# Patient Record
Sex: Female | Born: 1960 | Race: White | Hispanic: No | Marital: Married | Smoking: Former smoker
Health system: Southern US, Community
[De-identification: ages and names within clinical notes are randomized; demographics above are authoritative.]

## PROBLEM LIST (undated history)

## (undated) DIAGNOSIS — F1911 Other psychoactive substance abuse, in remission: Secondary | ICD-10-CM

## (undated) DIAGNOSIS — N951 Menopausal and female climacteric states: Secondary | ICD-10-CM

## (undated) DIAGNOSIS — E785 Hyperlipidemia, unspecified: Secondary | ICD-10-CM

## (undated) DIAGNOSIS — M1990 Unspecified osteoarthritis, unspecified site: Secondary | ICD-10-CM

## (undated) DIAGNOSIS — M069 Rheumatoid arthritis, unspecified: Secondary | ICD-10-CM

## (undated) DIAGNOSIS — E559 Vitamin D deficiency, unspecified: Secondary | ICD-10-CM

## (undated) HISTORY — DX: Vitamin D deficiency, unspecified: E55.9

## (undated) HISTORY — PX: TUBAL LIGATION: SHX77

## (undated) HISTORY — DX: Rheumatoid arthritis, unspecified: M06.9

## (undated) HISTORY — DX: Menopausal and female climacteric states: N95.1

## (undated) HISTORY — DX: Other psychoactive substance abuse, in remission: F19.11

## (undated) HISTORY — PX: ABDOMINAL HYSTERECTOMY: SHX81

---

## 2001-03-07 ENCOUNTER — Other Ambulatory Visit: Admission: RE | Admit: 2001-03-07 | Discharge: 2001-03-07 | Payer: Self-pay | Admitting: Obstetrics and Gynecology

## 2002-06-17 ENCOUNTER — Encounter: Admission: RE | Admit: 2002-06-17 | Discharge: 2002-06-17 | Payer: Self-pay | Admitting: Family Medicine

## 2002-06-17 ENCOUNTER — Encounter: Payer: Self-pay | Admitting: Family Medicine

## 2002-07-10 ENCOUNTER — Encounter (INDEPENDENT_AMBULATORY_CARE_PROVIDER_SITE_OTHER): Payer: Self-pay | Admitting: *Deleted

## 2002-07-10 ENCOUNTER — Observation Stay (HOSPITAL_COMMUNITY): Admission: RE | Admit: 2002-07-10 | Discharge: 2002-07-11 | Payer: Self-pay | Admitting: Surgery

## 2002-09-18 ENCOUNTER — Other Ambulatory Visit: Admission: RE | Admit: 2002-09-18 | Discharge: 2002-09-18 | Payer: Self-pay | Admitting: Obstetrics and Gynecology

## 2004-07-13 ENCOUNTER — Other Ambulatory Visit: Admission: RE | Admit: 2004-07-13 | Discharge: 2004-07-13 | Payer: Self-pay | Admitting: Obstetrics and Gynecology

## 2005-03-20 ENCOUNTER — Ambulatory Visit (HOSPITAL_COMMUNITY): Admission: RE | Admit: 2005-03-20 | Discharge: 2005-03-20 | Payer: Self-pay | Admitting: Obstetrics and Gynecology

## 2006-08-23 ENCOUNTER — Ambulatory Visit (HOSPITAL_COMMUNITY): Admission: RE | Admit: 2006-08-23 | Discharge: 2006-08-23 | Payer: Self-pay | Admitting: Obstetrics and Gynecology

## 2007-08-28 ENCOUNTER — Ambulatory Visit (HOSPITAL_COMMUNITY): Admission: RE | Admit: 2007-08-28 | Discharge: 2007-08-28 | Payer: Self-pay | Admitting: Obstetrics and Gynecology

## 2007-09-08 ENCOUNTER — Encounter: Admission: RE | Admit: 2007-09-08 | Discharge: 2007-09-08 | Payer: Self-pay | Admitting: Obstetrics and Gynecology

## 2008-10-12 ENCOUNTER — Ambulatory Visit (HOSPITAL_COMMUNITY): Admission: RE | Admit: 2008-10-12 | Discharge: 2008-10-12 | Payer: Self-pay | Admitting: Obstetrics and Gynecology

## 2009-10-13 ENCOUNTER — Ambulatory Visit (HOSPITAL_COMMUNITY): Admission: RE | Admit: 2009-10-13 | Discharge: 2009-10-13 | Payer: Self-pay | Admitting: Obstetrics and Gynecology

## 2010-02-20 ENCOUNTER — Encounter: Payer: Self-pay | Admitting: Obstetrics and Gynecology

## 2010-06-16 NOTE — Op Note (Signed)
NAMEMARLEA, GAMBILL                           ACCOUNT NO.:  [id number]   MEDICAL RECORD NO.:  [id number]                   PATIENT TYPE:  AMB   LOCATION:  DAY                                  FACILITY:  [facility]   PHYSICIAN:  Abigail Miyamoto, M.D.              DATE OF BIRTH:  Jul 27, 1960   DATE OF PROCEDURE:  07/10/2002  DATE OF DISCHARGE:                                 OPERATIVE REPORT   PREOPERATIVE DIAGNOSIS:  Symptomatic cholelithiasis.   POSTOPERATIVE DIAGNOSIS:  Symptomatic cholelithiasis.   SURGICAL PROCEDURE:  Laparoscopic cholecystectomy.   SURGEON:  Douglas A. Magnus Ivan, M.D.   ASSISTANT:  Sheppard Plumber. Earlene Plater, M.D.   ANESTHESIA:  General endotracheal.   ESTIMATED BLOOD LOSS:  Minimal.   PROCEDURE IN DETAIL:  The patient was brought to the operating room and  identified as Donna Jacobs.  She was placed supine on the operating room  table, and general anesthesia was induced.  Her abdomen was then prepped and  draped in the usual sterile fashion.  Using a #15 blade, a small transverse  incision was made below the umbilicus.  The incision was carried down  through the fascia which was then opened with a scalpel.  Hemostat was then  used to pass through the peritoneal cavity.  Next, a 0 Vicryl pursestring  suture was placed around the fascial opening.  The Hasson port was placed  through the opening, and insufflation of the abdomen was begun.  Next, an 12  mm port was placed in the patient's epigastrium, and two 5 mm ports were  placed in the patient's right flank under direct vision.  The gallbladder  was then retracted above the liver bed.  The cystic artery was found to be  anterior and was first clipped twice proximally and once distally and  transected with the scissors.  The cystic duct was then easily dissected  out, clipped three times proximally, once distally, and transected with  scissors as well.  A small posterior branch of the cystic artery was then  identified  and clipped once as well.  The gallbladder was then slowly  dissected free from the liver bed with the electrocautery.  Once the  gallbladder was freed from the liver bed, it was placed in an Endosac.  Liver bed was again examined, and hemostasis was achieved with cautery.  The  gallbladder was then removed through the incision at the umbilicus.  The 0  Vicryl at the umbilicus was tied, closing the fascial defect.  Examination  with camera here did reveal a small amount of bleeding here.  Electrocautery  was used in an attempt to stop this but since there was still a small ooze,  another 0 Vicryl in a figure-of-eight fashion was placed around the fascial  opening and helped achieve hemostasis.  The abdomen was then copiously  irrigated with normal saline.  Hemostasis appeared to be achieved.  All  ports were then removed under direct vision, and the abdomen was deflated.  All incisions were then anesthetized with 0.25% Marcaine and then closed  with 4-0 Monocryl subcuticular sutures.  Steri-Strips, gauze, and tape were  then applied.  The patient tolerated the procedure well.  All sponge,  needle, and instrument counts were correct at the end of the procedure.  The  patient was then extubated in the operating room and taken in stable  condition to the recovery room.                                              Abigail Miyamoto, M.D.   DB/MEDQ  D:  07/10/2002  T:  07/10/2002  Job:  [id number]

## 2010-09-14 ENCOUNTER — Other Ambulatory Visit: Payer: Self-pay

## 2010-09-14 DIAGNOSIS — M79671 Pain in right foot: Secondary | ICD-10-CM

## 2010-09-18 ENCOUNTER — Ambulatory Visit
Admission: RE | Admit: 2010-09-18 | Discharge: 2010-09-18 | Disposition: A | Discharge status: Home / Self Care | Payer: 59 | Source: Ambulatory Visit

## 2010-09-18 DIAGNOSIS — M79671 Pain in right foot: Secondary | ICD-10-CM

## 2010-09-18 MED ORDER — GADOBENATE DIMEGLUMINE 529 MG/ML IV SOLN
8.0000 mL | Freq: Once | INTRAVENOUS | Status: AC | PRN
  Administered 2010-09-18: 8 mL via INTRAVENOUS

## 2010-10-10 ENCOUNTER — Other Ambulatory Visit (HOSPITAL_COMMUNITY): Payer: Self-pay | Admitting: Obstetrics and Gynecology

## 2010-10-10 DIAGNOSIS — Z1231 Encounter for screening mammogram for malignant neoplasm of breast: Secondary | ICD-10-CM

## 2010-10-23 ENCOUNTER — Ambulatory Visit (HOSPITAL_COMMUNITY)
Admission: RE | Admit: 2010-10-23 | Discharge: 2010-10-23 | Disposition: A | Discharge status: Home / Self Care | Payer: 59 | Source: Ambulatory Visit | Attending: Obstetrics and Gynecology | Admitting: Obstetrics and Gynecology

## 2010-10-23 DIAGNOSIS — Z1231 Encounter for screening mammogram for malignant neoplasm of breast: Secondary | ICD-10-CM

## 2011-10-15 ENCOUNTER — Other Ambulatory Visit (HOSPITAL_COMMUNITY): Payer: Self-pay | Admitting: Nurse Practitioner

## 2011-10-15 DIAGNOSIS — Z1231 Encounter for screening mammogram for malignant neoplasm of breast: Secondary | ICD-10-CM

## 2011-11-02 ENCOUNTER — Ambulatory Visit (HOSPITAL_COMMUNITY)
Admission: RE | Admit: 2011-11-02 | Discharge: 2011-11-02 | Disposition: A | Discharge status: Home / Self Care | Payer: 59 | Source: Ambulatory Visit | Attending: Nurse Practitioner | Admitting: Nurse Practitioner

## 2011-11-02 DIAGNOSIS — Z1231 Encounter for screening mammogram for malignant neoplasm of breast: Secondary | ICD-10-CM

## 2012-10-22 ENCOUNTER — Other Ambulatory Visit (HOSPITAL_COMMUNITY): Payer: Self-pay | Admitting: Nurse Practitioner

## 2012-10-22 DIAGNOSIS — Z1231 Encounter for screening mammogram for malignant neoplasm of breast: Secondary | ICD-10-CM

## 2012-11-03 ENCOUNTER — Ambulatory Visit (HOSPITAL_COMMUNITY)
Admission: RE | Admit: 2012-11-03 | Discharge: 2012-11-03 | Disposition: A | Discharge status: Home / Self Care | Payer: 59 | Source: Ambulatory Visit | Attending: Nurse Practitioner | Admitting: Nurse Practitioner

## 2012-11-03 DIAGNOSIS — Z1231 Encounter for screening mammogram for malignant neoplasm of breast: Secondary | ICD-10-CM | POA: Insufficient documentation

## 2013-08-25 ENCOUNTER — Ambulatory Visit
Admission: RE | Admit: 2013-08-25 | Discharge: 2013-08-25 | Disposition: A | Discharge status: Home / Self Care | Payer: 59 | Source: Ambulatory Visit | Attending: Family Medicine | Admitting: Family Medicine

## 2013-08-25 ENCOUNTER — Other Ambulatory Visit: Payer: Self-pay | Admitting: Family Medicine

## 2013-08-25 DIAGNOSIS — M25572 Pain in left ankle and joints of left foot: Principal | ICD-10-CM

## 2013-08-25 DIAGNOSIS — M25571 Pain in right ankle and joints of right foot: Secondary | ICD-10-CM

## 2013-10-19 ENCOUNTER — Other Ambulatory Visit (HOSPITAL_COMMUNITY): Payer: Self-pay | Admitting: Nurse Practitioner

## 2013-10-19 DIAGNOSIS — Z1231 Encounter for screening mammogram for malignant neoplasm of breast: Secondary | ICD-10-CM

## 2013-11-10 ENCOUNTER — Ambulatory Visit (HOSPITAL_COMMUNITY): Payer: 59

## 2013-11-16 ENCOUNTER — Ambulatory Visit (HOSPITAL_COMMUNITY)
Admission: RE | Admit: 2013-11-16 | Discharge: 2013-11-16 | Disposition: A | Discharge status: Home / Self Care | Payer: 59 | Source: Ambulatory Visit | Attending: Nurse Practitioner | Admitting: Nurse Practitioner

## 2013-11-16 DIAGNOSIS — Z1231 Encounter for screening mammogram for malignant neoplasm of breast: Secondary | ICD-10-CM | POA: Diagnosis not present

## 2014-08-16 ENCOUNTER — Other Ambulatory Visit: Payer: Self-pay | Admitting: Gastroenterology

## 2014-11-11 ENCOUNTER — Other Ambulatory Visit (HOSPITAL_BASED_OUTPATIENT_CLINIC_OR_DEPARTMENT_OTHER): Payer: Self-pay | Admitting: Obstetrics and Gynecology

## 2014-11-11 DIAGNOSIS — Z1231 Encounter for screening mammogram for malignant neoplasm of breast: Secondary | ICD-10-CM

## 2014-11-18 ENCOUNTER — Ambulatory Visit (HOSPITAL_BASED_OUTPATIENT_CLINIC_OR_DEPARTMENT_OTHER): Payer: Self-pay

## 2015-09-14 ENCOUNTER — Ambulatory Visit
Admission: RE | Admit: 2015-09-14 | Discharge: 2015-09-14 | Disposition: A | Discharge status: Home / Self Care | Payer: 59 | Source: Ambulatory Visit | Attending: Rheumatology | Admitting: Rheumatology

## 2015-09-14 ENCOUNTER — Other Ambulatory Visit: Payer: Self-pay | Admitting: Rheumatology

## 2015-09-14 DIAGNOSIS — M545 Low back pain: Secondary | ICD-10-CM

## 2016-01-06 ENCOUNTER — Other Ambulatory Visit: Payer: Self-pay | Admitting: Rheumatology

## 2016-01-06 DIAGNOSIS — M545 Low back pain: Secondary | ICD-10-CM

## 2016-01-21 ENCOUNTER — Ambulatory Visit
Admission: RE | Admit: 2016-01-21 | Discharge: 2016-01-21 | Disposition: A | Discharge status: Home / Self Care | Payer: 59 | Source: Ambulatory Visit | Attending: Rheumatology | Admitting: Rheumatology

## 2016-01-21 DIAGNOSIS — M545 Low back pain: Secondary | ICD-10-CM

## 2016-04-05 ENCOUNTER — Other Ambulatory Visit: Payer: Self-pay | Admitting: Neurological Surgery

## 2016-04-05 DIAGNOSIS — M4316 Spondylolisthesis, lumbar region: Secondary | ICD-10-CM

## 2016-06-15 ENCOUNTER — Ambulatory Visit
Admission: RE | Admit: 2016-06-15 | Discharge: 2016-06-15 | Disposition: A | Discharge status: Home / Self Care | Payer: 59 | Source: Ambulatory Visit | Attending: Neurological Surgery | Admitting: Neurological Surgery

## 2016-06-15 DIAGNOSIS — M4316 Spondylolisthesis, lumbar region: Secondary | ICD-10-CM

## 2016-07-19 ENCOUNTER — Other Ambulatory Visit: Payer: Self-pay | Admitting: Neurological Surgery

## 2016-08-08 NOTE — Pre-Procedure Instructions (Signed)
Donna Jacobs  08/08/2016      [id number] - [place], [place] - [address] [place] [address] Phone: [phone] Fax: [phone]    Your procedure is scheduled on July 18  Report to [facility] Admitting at [phone] A.M.  Call this number if you have problems the morning of surgery:  [phone]   Remember:  Do not eat food or drink liquids after midnight.   Take these medicines the morning of surgery with A SIP OF WATER   7 days prior to surgery STOP taking any Aspirin, Aleve, Naproxen, Ibuprofen, Motrin, Advil, Goody's, BC's, all herbal medications, fish oil, and all vitamins    Do not wear jewelry, make-up or nail polish.  Do not wear lotions, powders, or perfumes, or deoderant.  Do not shave 48 hours prior to surgery.  Men may shave face and neck.  Do not bring valuables to the hospital.  [facility] is not responsible for any belongings or valuables.  Contacts, dentures or bridgework may not be worn into surgery.  Leave your suitcase in the car.  After surgery it may be brought to your room.  For patients admitted to the hospital, discharge time will be determined by your treatment team.  Patients discharged the day of surgery will not be allowed to drive home.    Special instructions:   [place]- Preparing For Surgery  Before surgery, you can play an important role. Because skin is not sterile, your skin needs to be as free of germs as possible. You can reduce the number of germs on your skin by washing with CHG (chlorahexidine gluconate) Soap before surgery.  CHG is an antiseptic cleaner which kills germs and bonds with the skin to continue killing germs even after washing.  Please do not use if you have an allergy to CHG or antibacterial soaps. If your skin becomes reddened/irritated stop using the CHG.  Do not shave (including legs and underarms) for at least 48 hours prior to  first CHG shower. It is OK to shave your face.  Please follow these instructions carefully.   1. Shower the NIGHT BEFORE SURGERY and the MORNING OF SURGERY with CHG.   2. If you chose to wash your hair, wash your hair first as usual with your normal shampoo.  3. After you shampoo, rinse your hair and body thoroughly to remove the shampoo.  4. Use CHG as you would any other liquid soap. You can apply CHG directly to the skin and wash gently with a scrungie or a clean washcloth.   5. Apply the CHG Soap to your body ONLY FROM THE NECK DOWN.  Do not use on open wounds or open sores. Avoid contact with your eyes, ears, mouth and genitals (private parts). Wash genitals (private parts) with your normal soap.  6. Wash thoroughly, paying special attention to the area where your surgery will be performed.  7. Thoroughly rinse your body with warm water from the neck down.  8. DO NOT shower/wash with your normal soap after using and rinsing off the CHG Soap.  9. Pat yourself dry with a CLEAN TOWEL.   10. Wear CLEAN PAJAMAS   11. Place CLEAN SHEETS on your bed the night of your first shower and DO NOT SLEEP WITH PETS.    Day of Surgery: Do not apply any deodorants/lotions. Please wear clean clothes to the hospital/surgery center.  Please read over the following fact sheets that you were given.

## 2016-08-09 ENCOUNTER — Ambulatory Visit (HOSPITAL_COMMUNITY)
Admission: RE | Admit: 2016-08-09 | Discharge: 2016-08-09 | Disposition: A | Discharge status: Home / Self Care | Payer: 59 | Source: Ambulatory Visit | Attending: Neurological Surgery | Admitting: Neurological Surgery

## 2016-08-09 ENCOUNTER — Encounter (HOSPITAL_COMMUNITY)
Admission: RE | Admit: 2016-08-09 | Discharge: 2016-08-09 | Disposition: A | Discharge status: Home / Self Care | Payer: 59 | Source: Ambulatory Visit | Attending: Neurological Surgery | Admitting: Neurological Surgery

## 2016-08-09 ENCOUNTER — Encounter (HOSPITAL_COMMUNITY): Payer: Self-pay

## 2016-08-09 DIAGNOSIS — M4316 Spondylolisthesis, lumbar region: Secondary | ICD-10-CM | POA: Insufficient documentation

## 2016-08-09 DIAGNOSIS — M4310 Spondylolisthesis, site unspecified: Secondary | ICD-10-CM

## 2016-08-09 DIAGNOSIS — Z01818 Encounter for other preprocedural examination: Secondary | ICD-10-CM | POA: Diagnosis not present

## 2016-08-09 HISTORY — DX: Hyperlipidemia, unspecified: E78.5

## 2016-08-09 HISTORY — DX: Unspecified osteoarthritis, unspecified site: M19.90

## 2016-08-09 LAB — BASIC METABOLIC PANEL
ANION GAP: 9 (ref 5–15)
BUN: 10 mg/dL (ref 6–20)
CALCIUM: 9 mg/dL (ref 8.9–10.3)
CO2: 25 mmol/L (ref 22–32)
CREATININE: 0.71 mg/dL (ref 0.44–1.00)
Chloride: 106 mmol/L (ref 101–111)
Glucose, Bld: 93 mg/dL (ref 65–99)
Potassium: 3.7 mmol/L (ref 3.5–5.1)
SODIUM: 140 mmol/L (ref 135–145)

## 2016-08-09 LAB — TYPE AND SCREEN
ABO/RH(D): A POS
Antibody Screen: NEGATIVE

## 2016-08-09 LAB — CBC WITH DIFFERENTIAL/PLATELET
BASOS ABS: 0.1 10*3/uL (ref 0.0–0.1)
BASOS PCT: 1 %
EOS ABS: 0.2 10*3/uL (ref 0.0–0.7)
Eosinophils Relative: 2 %
HEMATOCRIT: 41.4 % (ref 36.0–46.0)
HEMOGLOBIN: 13.9 g/dL (ref 12.0–15.0)
Lymphocytes Relative: 24 %
Lymphs Abs: 1.7 10*3/uL (ref 0.7–4.0)
MCH: 29.6 pg (ref 26.0–34.0)
MCHC: 33.6 g/dL (ref 30.0–36.0)
MCV: 88.1 fL (ref 78.0–100.0)
Monocytes Absolute: 0.5 10*3/uL (ref 0.1–1.0)
Monocytes Relative: 6 %
NEUTROS ABS: 4.7 10*3/uL (ref 1.7–7.7)
NEUTROS PCT: 67 %
Platelets: 229 10*3/uL (ref 150–400)
RBC: 4.7 MIL/uL (ref 3.87–5.11)
RDW: 13 % (ref 11.5–15.5)
WBC: 7.1 10*3/uL (ref 4.0–10.5)

## 2016-08-09 LAB — PROTIME-INR
INR: 1.06
PROTHROMBIN TIME: 13.8 s (ref 11.4–15.2)

## 2016-08-09 LAB — ABO/RH: ABO/RH(D): A POS

## 2016-08-09 LAB — SURGICAL PCR SCREEN
MRSA, PCR: NEGATIVE
Staphylococcus aureus: POSITIVE — AB

## 2016-08-09 NOTE — Pre-Procedure Instructions (Signed)
Donna Jacobs  08/09/2016      [id number] - [place], [place] - [address] [place] [address] Phone: [phone] Fax: [phone]    Your procedure is scheduled on July 18  Report to [facility] Admitting at [phone] A.M.  Call this number if you have problems the morning of surgery:  [phone]   Remember:  Do not eat food or drink liquids after midnight. On 08/14/2016- Tuesday night    Take these medicines the morning of surgery with A SIP OF WATER : Tylenol is OK the morning of surgery if you need it  7 days prior to surgery STOP taking any Aspirin, Aleve, Naproxen, Ibuprofen, Motrin, Advil, Goody's, BC's, all herbal medications, fish oil, and all vitamins    Do not wear jewelry, make-up or nail polish.  Do not wear lotions, powders, or perfumes, or deoderant.  Do not shave 48 hours prior to surgery.  Men may shave face and neck.  Do not bring valuables to the hospital.  [facility] is not responsible for any belongings or valuables.  Contacts, dentures or bridgework may not be worn into surgery.  Leave your suitcase in the car.  After surgery it may be brought to your room.  For patients admitted to the hospital, discharge time will be determined by your treatment team.  Patients discharged the day of surgery will not be allowed to drive home.    Special instructions:   [place]- Preparing For Surgery  Before surgery, you can play an important role. Because skin is not sterile, your skin needs to be as free of germs as possible. You can reduce the number of germs on your skin by washing with CHG (chlorahexidine gluconate) Soap before surgery.  CHG is an antiseptic cleaner which kills germs and bonds with the skin to continue killing germs even after washing.  Please do not use if you have an allergy to CHG or antibacterial soaps. If your skin becomes reddened/irritated stop using the  CHG.  Do not shave (including legs and underarms) for at least 48 hours prior to first CHG shower. It is OK to shave your face.  Please follow these instructions carefully.   1. Shower the NIGHT BEFORE SURGERY and the MORNING OF SURGERY with CHG.   2. If you chose to wash your hair, wash your hair first as usual with your normal shampoo.  3. After you shampoo, rinse your hair and body thoroughly to remove the shampoo.  4. Use CHG as you would any other liquid soap. You can apply CHG directly to the skin and wash gently with a scrungie or a clean washcloth.   5. Apply the CHG Soap to your body ONLY FROM THE NECK DOWN.  Do not use on open wounds or open sores. Avoid contact with your eyes, ears, mouth and genitals (private parts). Wash genitals (private parts) with your normal soap.  6. Wash thoroughly, paying special attention to the area where your surgery will be performed.  7. Thoroughly rinse your body with warm water from the neck down.  8. DO NOT shower/wash with your normal soap after using and rinsing off the CHG Soap.  9. Pat yourself dry with a CLEAN TOWEL.   10. Wear CLEAN PAJAMAS   11. Place CLEAN SHEETS on your bed the night of your first shower and DO NOT SLEEP WITH PETS.  Day of Surgery: Do not apply any deodorants/lotions. Please wear clean clothes to the hospital/surgery center.      Please read over the following fact sheets that you were given. Coughing and Deep Breathing, MRSA Information and Surgical Site Infection Prevention

## 2016-08-09 NOTE — Progress Notes (Signed)
Pt. Remarks that she is off all meds. that she was on previously for ? Autoimmune disorder.  Pt. Unaware of an auto immune condition- states that she knows she has " arthritis".  Pt. Speaks of seeing Dr. Kellie Simmering in the past.  Pt. Reports that she goes "to whomever" if she gets sick but she states about a year ago she went to [place] [facility]. At [employer]. Pt. Reports that she has had in the past "heavy breathing". Pt. States she hasn't ever tolda nyone about it.  Pt. Denies any chest concerns Today. Pt. Is a weak historian, doesn't know her weight & seemed confused as to why she was ever on methotrexate, plaquenil, etc. Pt. 's record from [facility]. Requested by fax.

## 2016-08-13 NOTE — Progress Notes (Signed)
Call to [facility] gr[facility]- at [facility] location, pt. Last seen there in 2016- by Dr. Zachery Dauer &/ or Dr. Clarene Duke.  Spoke with Sunny Schlein & she will fax that record to [facility]- [phone].

## 2016-08-15 ENCOUNTER — Encounter (HOSPITAL_COMMUNITY): Payer: Self-pay

## 2016-08-15 ENCOUNTER — Inpatient Hospital Stay (HOSPITAL_COMMUNITY): Payer: 59 | Admitting: Certified Registered"

## 2016-08-15 ENCOUNTER — Inpatient Hospital Stay (HOSPITAL_COMMUNITY)
Admission: RE | Admit: 2016-08-15 | Discharge: 2016-08-16 | DRG: 460 | Disposition: A | Discharge status: Home / Self Care | Payer: 59 | Source: Ambulatory Visit | Attending: Neurological Surgery | Admitting: Neurological Surgery

## 2016-08-15 ENCOUNTER — Encounter (HOSPITAL_COMMUNITY)
Admission: RE | Disposition: A | Discharge status: Home / Self Care | Payer: Self-pay | Source: Ambulatory Visit | Attending: Neurological Surgery

## 2016-08-15 ENCOUNTER — Inpatient Hospital Stay (HOSPITAL_COMMUNITY): Payer: 59

## 2016-08-15 ENCOUNTER — Inpatient Hospital Stay (HOSPITAL_COMMUNITY): Payer: 59 | Admitting: Emergency Medicine

## 2016-08-15 DIAGNOSIS — Z981 Arthrodesis status: Secondary | ICD-10-CM

## 2016-08-15 DIAGNOSIS — Z87891 Personal history of nicotine dependence: Secondary | ICD-10-CM

## 2016-08-15 DIAGNOSIS — Z885 Allergy status to narcotic agent status: Secondary | ICD-10-CM | POA: Diagnosis not present

## 2016-08-15 DIAGNOSIS — M419 Scoliosis, unspecified: Secondary | ICD-10-CM | POA: Diagnosis present

## 2016-08-15 DIAGNOSIS — Z419 Encounter for procedure for purposes other than remedying health state, unspecified: Secondary | ICD-10-CM

## 2016-08-15 DIAGNOSIS — M48061 Spinal stenosis, lumbar region without neurogenic claudication: Secondary | ICD-10-CM | POA: Diagnosis present

## 2016-08-15 DIAGNOSIS — M4316 Spondylolisthesis, lumbar region: Secondary | ICD-10-CM | POA: Diagnosis present

## 2016-08-15 HISTORY — DX: Arthrodesis status: Z98.1

## 2016-08-15 SURGERY — POSTERIOR LUMBAR FUSION 1 LEVEL
Anesthesia: General | Site: Spine Lumbar

## 2016-08-15 MED ORDER — METHOCARBAMOL 1000 MG/10ML IJ SOLN
500.0000 mg | Freq: Four times a day (QID) | INTRAVENOUS | Status: DC | PRN
  Filled 2016-08-15: qty 5

## 2016-08-15 MED ORDER — VANCOMYCIN HCL 1000 MG IV SOLR
INTRAVENOUS | Status: AC
  Filled 2016-08-15: qty 1000

## 2016-08-15 MED ORDER — PROPOFOL 10 MG/ML IV BOLUS
INTRAVENOUS | Status: AC
  Filled 2016-08-15: qty 20

## 2016-08-15 MED ORDER — DEXAMETHASONE SODIUM PHOSPHATE 10 MG/ML IJ SOLN
INTRAMUSCULAR | Status: AC
  Filled 2016-08-15: qty 1

## 2016-08-15 MED ORDER — ONDANSETRON HCL 4 MG/2ML IJ SOLN
INTRAMUSCULAR | Status: AC
  Filled 2016-08-15: qty 2

## 2016-08-15 MED ORDER — ONDANSETRON HCL 4 MG PO TABS: 4.0000 mg | ORAL_TABLET | Freq: Four times a day (QID) | ORAL | Status: DC | PRN

## 2016-08-15 MED ORDER — OXYCODONE HCL 5 MG PO TABS
5.0000 mg | ORAL_TABLET | ORAL | Status: DC | PRN
  Administered 2016-08-15 – 2016-08-16 (×4): 10 mg via ORAL
  Filled 2016-08-15 (×3): qty 2

## 2016-08-15 MED ORDER — CHLORHEXIDINE GLUCONATE CLOTH 2 % EX PADS: 6.0000 | MEDICATED_PAD | Freq: Once | CUTANEOUS | Status: DC

## 2016-08-15 MED ORDER — ARTIFICIAL TEARS OPHTHALMIC OINT
TOPICAL_OINTMENT | OPHTHALMIC | Status: AC
  Filled 2016-08-15: qty 3.5

## 2016-08-15 MED ORDER — ONDANSETRON HCL 4 MG/2ML IJ SOLN
INTRAMUSCULAR | Status: DC | PRN
  Administered 2016-08-15: 4 mg via INTRAVENOUS

## 2016-08-15 MED ORDER — DEXAMETHASONE SODIUM PHOSPHATE 10 MG/ML IJ SOLN
10.0000 mg | INTRAMUSCULAR | Status: DC
  Filled 2016-08-15: qty 1

## 2016-08-15 MED ORDER — EPHEDRINE 5 MG/ML INJ
INTRAVENOUS | Status: AC
  Filled 2016-08-15: qty 10

## 2016-08-15 MED ORDER — SODIUM CHLORIDE 0.9% FLUSH: 3.0000 mL | INTRAVENOUS | Status: DC | PRN

## 2016-08-15 MED ORDER — BUPIVACAINE HCL (PF) 0.25 % IJ SOLN
INTRAMUSCULAR | Status: DC | PRN
Start: 2016-08-15 — End: 2016-08-15
  Administered 2016-08-15: 5 mL

## 2016-08-15 MED ORDER — THROMBIN 5000 UNITS EX SOLR
CUTANEOUS | Status: AC
  Filled 2016-08-15: qty 5000

## 2016-08-15 MED ORDER — FENTANYL CITRATE (PF) 250 MCG/5ML IJ SOLN
INTRAMUSCULAR | Status: AC
  Filled 2016-08-15: qty 5

## 2016-08-15 MED ORDER — ACETAMINOPHEN 650 MG RE SUPP: 650.0000 mg | RECTAL | Status: DC | PRN

## 2016-08-15 MED ORDER — METHOCARBAMOL 500 MG PO TABS
500.0000 mg | ORAL_TABLET | Freq: Four times a day (QID) | ORAL | Status: DC | PRN
  Administered 2016-08-15 – 2016-08-16 (×2): 500 mg via ORAL
  Filled 2016-08-15: qty 1

## 2016-08-15 MED ORDER — VECURONIUM BROMIDE 10 MG IV SOLR
INTRAVENOUS | Status: DC | PRN
  Administered 2016-08-15 (×3): 1 mg via INTRAVENOUS
  Administered 2016-08-15: 2 mg via INTRAVENOUS
  Administered 2016-08-15: 1 mg via INTRAVENOUS

## 2016-08-15 MED ORDER — SENNA 8.6 MG PO TABS
1.0000 | ORAL_TABLET | Freq: Two times a day (BID) | ORAL | Status: DC
  Administered 2016-08-15: 8.6 mg via ORAL
  Filled 2016-08-15: qty 1

## 2016-08-15 MED ORDER — VECURONIUM BROMIDE 10 MG IV SOLR
INTRAVENOUS | Status: AC
  Filled 2016-08-15: qty 10

## 2016-08-15 MED ORDER — CELECOXIB 200 MG PO CAPS
200.0000 mg | ORAL_CAPSULE | Freq: Two times a day (BID) | ORAL | Status: DC
  Administered 2016-08-15: 200 mg via ORAL
  Filled 2016-08-15: qty 1

## 2016-08-15 MED ORDER — FENTANYL CITRATE (PF) 100 MCG/2ML IJ SOLN
INTRAMUSCULAR | Status: AC
  Filled 2016-08-15: qty 2

## 2016-08-15 MED ORDER — METHOCARBAMOL 500 MG PO TABS
ORAL_TABLET | ORAL | Status: AC
  Administered 2016-08-15: 500 mg via ORAL
  Filled 2016-08-15: qty 1

## 2016-08-15 MED ORDER — CEFAZOLIN SODIUM-DEXTROSE 2-4 GM/100ML-% IV SOLN
2.0000 g | INTRAVENOUS | Status: AC
  Administered 2016-08-15: 2 g via INTRAVENOUS
  Filled 2016-08-15: qty 100

## 2016-08-15 MED ORDER — ONDANSETRON HCL 4 MG/2ML IJ SOLN: 4.0000 mg | Freq: Once | INTRAMUSCULAR | Status: DC | PRN

## 2016-08-15 MED ORDER — OXYCODONE HCL 5 MG PO TABS
ORAL_TABLET | ORAL | Status: AC
  Administered 2016-08-15: 10 mg via ORAL
  Filled 2016-08-15: qty 2

## 2016-08-15 MED ORDER — VANCOMYCIN HCL 1000 MG IV SOLR
INTRAVENOUS | Status: DC | PRN
  Administered 2016-08-15: 1000 mg via TOPICAL

## 2016-08-15 MED ORDER — ROCURONIUM BROMIDE 50 MG/5ML IV SOLN
INTRAVENOUS | Status: AC
  Filled 2016-08-15: qty 1

## 2016-08-15 MED ORDER — THROMBIN 20000 UNITS EX SOLR
CUTANEOUS | Status: AC
  Filled 2016-08-15: qty 20000

## 2016-08-15 MED ORDER — SODIUM CHLORIDE 0.9% FLUSH: 3.0000 mL | Freq: Two times a day (BID) | INTRAVENOUS | Status: DC

## 2016-08-15 MED ORDER — SODIUM CHLORIDE 0.9 % IR SOLN
Status: DC | PRN
  Administered 2016-08-15: 14:00:00

## 2016-08-15 MED ORDER — DEXTROSE 5 % IV SOLN
INTRAVENOUS | Status: DC | PRN
  Administered 2016-08-15: 40 ug/min via INTRAVENOUS

## 2016-08-15 MED ORDER — CEFAZOLIN SODIUM-DEXTROSE 2-4 GM/100ML-% IV SOLN
2.0000 g | Freq: Three times a day (TID) | INTRAVENOUS | Status: AC
  Administered 2016-08-15 – 2016-08-16 (×2): 2 g via INTRAVENOUS
  Filled 2016-08-15 (×2): qty 100

## 2016-08-15 MED ORDER — PROPOFOL 10 MG/ML IV BOLUS
INTRAVENOUS | Status: DC | PRN
  Administered 2016-08-15: 100 mg via INTRAVENOUS

## 2016-08-15 MED ORDER — FENTANYL CITRATE (PF) 100 MCG/2ML IJ SOLN
INTRAMUSCULAR | Status: AC
  Administered 2016-08-15: 50 ug via INTRAVENOUS
  Filled 2016-08-15: qty 2

## 2016-08-15 MED ORDER — 0.9 % SODIUM CHLORIDE (POUR BTL) OPTIME
TOPICAL | Status: DC | PRN
  Administered 2016-08-15: 1000 mL

## 2016-08-15 MED ORDER — MIDAZOLAM HCL 2 MG/2ML IJ SOLN
INTRAMUSCULAR | Status: DC | PRN
Start: 2016-08-15 — End: 2016-08-15
  Administered 2016-08-15: 2 mg via INTRAVENOUS

## 2016-08-15 MED ORDER — BUPIVACAINE HCL (PF) 0.25 % IJ SOLN
INTRAMUSCULAR | Status: AC
  Filled 2016-08-15: qty 30

## 2016-08-15 MED ORDER — MORPHINE SULFATE (PF) 4 MG/ML IV SOLN
2.0000 mg | INTRAVENOUS | Status: DC | PRN
Start: 2016-08-15 — End: 2016-08-16

## 2016-08-15 MED ORDER — MENTHOL 3 MG MT LOZG: 1.0000 | LOZENGE | OROMUCOSAL | Status: DC | PRN

## 2016-08-15 MED ORDER — POTASSIUM CHLORIDE IN NACL 20-0.9 MEQ/L-% IV SOLN: INTRAVENOUS | Status: DC

## 2016-08-15 MED ORDER — DEXAMETHASONE SODIUM PHOSPHATE 10 MG/ML IJ SOLN
INTRAMUSCULAR | Status: DC | PRN
  Administered 2016-08-15: 10 mg via INTRAVENOUS

## 2016-08-15 MED ORDER — FENTANYL CITRATE (PF) 250 MCG/5ML IJ SOLN
INTRAMUSCULAR | Status: DC | PRN
  Administered 2016-08-15 (×2): 50 ug via INTRAVENOUS
  Administered 2016-08-15: 100 ug via INTRAVENOUS
  Administered 2016-08-15: 50 ug via INTRAVENOUS

## 2016-08-15 MED ORDER — LIDOCAINE 2% (20 MG/ML) 5 ML SYRINGE
INTRAMUSCULAR | Status: DC | PRN
  Administered 2016-08-15: 60 mg via INTRAVENOUS

## 2016-08-15 MED ORDER — PHENOL 1.4 % MT LIQD: 1.0000 | OROMUCOSAL | Status: DC | PRN

## 2016-08-15 MED ORDER — SURGIFOAM 100 EX MISC
CUTANEOUS | Status: DC | PRN
  Administered 2016-08-15: 14:00:00 via TOPICAL

## 2016-08-15 MED ORDER — FENTANYL CITRATE (PF) 100 MCG/2ML IJ SOLN
25.0000 ug | INTRAMUSCULAR | Status: DC | PRN
  Administered 2016-08-15 (×3): 50 ug via INTRAVENOUS

## 2016-08-15 MED ORDER — ONDANSETRON HCL 4 MG/2ML IJ SOLN: 4.0000 mg | Freq: Four times a day (QID) | INTRAMUSCULAR | Status: DC | PRN

## 2016-08-15 MED ORDER — MIDAZOLAM HCL 2 MG/2ML IJ SOLN
INTRAMUSCULAR | Status: AC
  Filled 2016-08-15: qty 2

## 2016-08-15 MED ORDER — PHENYLEPHRINE 40 MCG/ML (10ML) SYRINGE FOR IV PUSH (FOR BLOOD PRESSURE SUPPORT)
PREFILLED_SYRINGE | INTRAVENOUS | Status: DC | PRN
  Administered 2016-08-15: 80 ug via INTRAVENOUS

## 2016-08-15 MED ORDER — PHENYLEPHRINE 40 MCG/ML (10ML) SYRINGE FOR IV PUSH (FOR BLOOD PRESSURE SUPPORT)
PREFILLED_SYRINGE | INTRAVENOUS | Status: AC
  Filled 2016-08-15: qty 10

## 2016-08-15 MED ORDER — LACTATED RINGERS IV SOLN
INTRAVENOUS | Status: DC | PRN
Start: 2016-08-15 — End: 2016-08-15
  Administered 2016-08-15 (×2): via INTRAVENOUS

## 2016-08-15 MED ORDER — EPHEDRINE SULFATE-NACL 50-0.9 MG/10ML-% IV SOSY
PREFILLED_SYRINGE | INTRAVENOUS | Status: DC | PRN
  Administered 2016-08-15: 10 mg via INTRAVENOUS
  Administered 2016-08-15: 5 mg via INTRAVENOUS

## 2016-08-15 MED ORDER — ROCURONIUM BROMIDE 10 MG/ML (PF) SYRINGE
PREFILLED_SYRINGE | INTRAVENOUS | Status: DC | PRN
  Administered 2016-08-15: 50 mg via INTRAVENOUS

## 2016-08-15 MED ORDER — THROMBIN 5000 UNITS EX SOLR
CUTANEOUS | Status: DC | PRN
  Administered 2016-08-15: 14:00:00 via TOPICAL

## 2016-08-15 MED ORDER — SUGAMMADEX SODIUM 200 MG/2ML IV SOLN
INTRAVENOUS | Status: DC | PRN
  Administered 2016-08-15: 200 mg via INTRAVENOUS

## 2016-08-15 MED ORDER — LIDOCAINE HCL (CARDIAC) 20 MG/ML IV SOLN
INTRAVENOUS | Status: AC
  Filled 2016-08-15: qty 5

## 2016-08-15 MED ORDER — GLYCOPYRROLATE 0.2 MG/ML IV SOSY
PREFILLED_SYRINGE | INTRAVENOUS | Status: DC | PRN
  Administered 2016-08-15: .2 mg via INTRAVENOUS

## 2016-08-15 MED ORDER — ACETAMINOPHEN 325 MG PO TABS: 650.0000 mg | ORAL_TABLET | ORAL | Status: DC | PRN

## 2016-08-15 SURGICAL SUPPLY — 56 items
ATEC PORO TIPS 10D 9W 25X9X11 (Bone Implant) ×4 IMPLANT
BAG DECANTER FOR FLEXI CONT (MISCELLANEOUS) ×2 IMPLANT
BASKET BONE COLLECTION (BASKET) ×2 IMPLANT
BENZOIN TINCTURE PRP APPL 2/3 (GAUZE/BANDAGES/DRESSINGS) ×2 IMPLANT
BLADE CLIPPER SURG (BLADE) IMPLANT
BUR MATCHSTICK NEURO 3.0 LAGG (BURR) ×2 IMPLANT
CANISTER SUCT 3000ML PPV (MISCELLANEOUS) ×2 IMPLANT
CARTRIDGE OIL MAESTRO DRILL (MISCELLANEOUS) ×1 IMPLANT
CONT SPEC 4OZ CLIKSEAL STRL BL (MISCELLANEOUS) ×2 IMPLANT
COVER BACK TABLE 60X90IN (DRAPES) ×2 IMPLANT
DERMABOND ADVANCED (GAUZE/BANDAGES/DRESSINGS) ×1
DERMABOND ADVANCED .7 DNX12 (GAUZE/BANDAGES/DRESSINGS) ×1 IMPLANT
DIFFUSER DRILL AIR PNEUMATIC (MISCELLANEOUS) ×2 IMPLANT
DRAPE C-ARM 42X72 X-RAY (DRAPES) ×4 IMPLANT
DRAPE LAPAROTOMY 100X72X124 (DRAPES) ×2 IMPLANT
DRAPE POUCH INSTRU U-SHP 10X18 (DRAPES) ×2 IMPLANT
DRAPE SURG 17X23 STRL (DRAPES) ×2 IMPLANT
DRSG OPSITE POSTOP 4X6 (GAUZE/BANDAGES/DRESSINGS) ×2 IMPLANT
DURAPREP 26ML APPLICATOR (WOUND CARE) ×2 IMPLANT
ELECT REM PT RETURN 9FT ADLT (ELECTROSURGICAL) ×2
ELECTRODE REM PT RTRN 9FT ADLT (ELECTROSURGICAL) ×1 IMPLANT
EVACUATOR 1/8 PVC DRAIN (DRAIN) ×2 IMPLANT
GAUZE SPONGE 4X4 16PLY XRAY LF (GAUZE/BANDAGES/DRESSINGS) IMPLANT
GLOVE BIO SURGEON STRL SZ7 (GLOVE) IMPLANT
GLOVE BIO SURGEON STRL SZ8 (GLOVE) ×4 IMPLANT
GLOVE BIOGEL PI IND STRL 7.0 (GLOVE) IMPLANT
GLOVE BIOGEL PI INDICATOR 7.0 (GLOVE)
GOWN STRL REUS W/ TWL LRG LVL3 (GOWN DISPOSABLE) IMPLANT
GOWN STRL REUS W/ TWL XL LVL3 (GOWN DISPOSABLE) ×2 IMPLANT
GOWN STRL REUS W/TWL 2XL LVL3 (GOWN DISPOSABLE) IMPLANT
GOWN STRL REUS W/TWL LRG LVL3 (GOWN DISPOSABLE)
GOWN STRL REUS W/TWL XL LVL3 (GOWN DISPOSABLE) ×2
HEMOSTAT POWDER KIT SURGIFOAM (HEMOSTASIS) IMPLANT
KIT BASIN OR (CUSTOM PROCEDURE TRAY) ×2 IMPLANT
KIT ROOM TURNOVER OR (KITS) ×2 IMPLANT
NEEDLE HYPO 25X1 1.5 SAFETY (NEEDLE) ×2 IMPLANT
NS IRRIG 1000ML POUR BTL (IV SOLUTION) ×2 IMPLANT
OIL CARTRIDGE MAESTRO DRILL (MISCELLANEOUS) ×2
PACK LAMINECTOMY NEURO (CUSTOM PROCEDURE TRAY) ×2 IMPLANT
PAD ARMBOARD 7.5X6 YLW CONV (MISCELLANEOUS) ×6 IMPLANT
ROD PC 5.5X65 TI ARSENAL (Rod) ×4 IMPLANT
SCREW CBX 5.5X35MM ARSENAL (Screw) ×6 IMPLANT
SCREW CORT CANC 5.5X40 (Screw) ×2 IMPLANT
SCREW SET SPINAL ARSENAL 47127 (Screw) ×8 IMPLANT
SPONGE LAP 4X18 X RAY DECT (DISPOSABLE) IMPLANT
SPONGE SURGIFOAM ABS GEL 100 (HEMOSTASIS) ×2 IMPLANT
STRIP BIOACTIVE 10CC 25X100X4 (Miscellaneous) ×2 IMPLANT
STRIP CLOSURE SKIN 1/2X4 (GAUZE/BANDAGES/DRESSINGS) ×2 IMPLANT
SUT VIC AB 0 CT1 18XCR BRD8 (SUTURE) ×1 IMPLANT
SUT VIC AB 0 CT1 8-18 (SUTURE) ×1
SUT VIC AB 2-0 CP2 18 (SUTURE) ×2 IMPLANT
SUT VIC AB 3-0 SH 8-18 (SUTURE) ×4 IMPLANT
TOWEL GREEN STERILE (TOWEL DISPOSABLE) ×2 IMPLANT
TOWEL GREEN STERILE FF (TOWEL DISPOSABLE) ×2 IMPLANT
TRAY FOLEY W/METER SILVER 16FR (SET/KITS/TRAYS/PACK) ×2 IMPLANT
WATER STERILE IRR 1000ML POUR (IV SOLUTION) ×2 IMPLANT

## 2016-08-15 NOTE — H&P (Signed)
Subjective: Patient is a 56 y.o. female admitted for PLIF. Onset of symptoms was several months ago, gradually worsening since that time.  The pain is rated severe, and is located at the across the lower back and radiates to legs. The pain is described as aching and occurs all day. The symptoms have been progressive. Symptoms are exacerbated by exercise. MRI or CT showed spondylolisthesis L4-5   Past Medical History:  Diagnosis Date  . Arthritis    Lumbar - spondylisthesis   . Hyperlipidemia    " I use to take med. for cholesteol, but only for a short time"    Past Surgical History:  Procedure Laterality Date  . ABDOMINAL HYSTERECTOMY    . TUBAL LIGATION    . VAGINAL DELIVERY     x2    Prior to Admission medications   Medication Sig Start Date End Date Taking? Authorizing Provider  acetaminophen (TYLENOL) 325 MG tablet Take 325 mg by mouth every 6 (six) hours as needed for mild pain or headache.   Yes [provider]   Allergies  Allergen Reactions  . Codeine Nausea And Vomiting    Social History  Substance Use Topics  . Smoking status: Former Smoker    Types: Cigarettes    Quit date: 08/10/1975  . Smokeless tobacco: Never Used  . Alcohol use No    History reviewed. No pertinent family history.   Review of Systems  Positive ROS: neg  All other systems have been reviewed and were otherwise negative with the exception of those mentioned in the HPI and as above.  Objective: Vital signs in last 24 hours: Temp:  [97.7 F (36.5 C)] 97.7 F (36.5 C) (07/18 0906) Pulse Rate:  [64] 64 (07/18 0906) Resp:  [18] 18 (07/18 0906) BP: (171)/(71) 171/71 (07/18 0906) SpO2:  [100 %] 100 % (07/18 0906) Weight:  [75.1 kg (165 lb 8 oz)] 75.1 kg (165 lb 8 oz) (07/18 0945)  General Appearance: Alert, cooperative, no distress, appears stated age Head: Normocephalic, without obvious abnormality, atraumatic Eyes: PERRL, conjunctiva/corneas clear, EOM's intact    Neck: Supple,  symmetrical, trachea midline Back: Symmetric, no curvature, ROM normal, no CVA tenderness Lungs:  respirations unlabored Heart: Regular rate and rhythm Abdomen: Soft, non-tender Extremities: Extremities normal, atraumatic, no cyanosis or edema Pulses: 2+ and symmetric all extremities Skin: Skin color, texture, turgor normal, no rashes or lesions  NEUROLOGIC:   Mental status: Alert and oriented x4,  no aphasia, good attention span, fund of knowledge, and memory Motor Exam - grossly normal Sensory Exam - grossly normal Reflexes: 1+ Coordination - grossly normal Gait - grossly normal Balance - grossly normal Cranial Nerves: I: smell Not tested  II: visual acuity  OS: nl    OD: nl  II: visual fields Full to confrontation  II: pupils Equal, round, reactive to light  III,VII: ptosis None  III,IV,VI: extraocular muscles  Full ROM  V: mastication Normal  V: facial light touch sensation  Normal  V,VII: corneal reflex  Present  VII: facial muscle function - upper  Normal  VII: facial muscle function - lower Normal  VIII: hearing Not tested  IX: soft palate elevation  Normal  IX,X: gag reflex Present  XI: trapezius strength  5/5  XI: sternocleidomastoid strength 5/5  XI: neck flexion strength  5/5  XII: tongue strength  Normal    Data Review Lab Results  Component Value Date   WBC 7.1 08/09/2016   HGB 13.9 08/09/2016   HCT 41.4  08/09/2016   MCV 88.1 08/09/2016   PLT 229 08/09/2016   Lab Results  Component Value Date   NA 140 08/09/2016   K 3.7 08/09/2016   CL 106 08/09/2016   CO2 25 08/09/2016   BUN 10 08/09/2016   CREATININE 0.71 08/09/2016   GLUCOSE 93 08/09/2016   Lab Results  Component Value Date   INR 1.06 08/09/2016    Assessment/Plan: Patient admitted for PLIF. Patient has failed a reasonable attempt at conservative therapy.  I explained the condition and procedure to the patient and answered any questions.  Patient wishes to proceed with procedure as  planned. Understands risks/ benefits and typical outcomes of procedure.   Rodert Hinch S 08/15/2016 12:20 PM

## 2016-08-15 NOTE — Anesthesia Postprocedure Evaluation (Signed)
Anesthesia Post Note  Patient: Donna Jacobs  Procedure(s) Performed: Procedure(s) (LRB): LUMBAR FOUR-FIVE POSTERIOR LUMBAR INTERBODY FUSION (N/A)     Patient location during evaluation: PACU Anesthesia Type: General Level of consciousness: awake and alert Pain management: pain level controlled Vital Signs Assessment: post-procedure vital signs reviewed and stable Respiratory status: spontaneous breathing, nonlabored ventilation, respiratory function stable and patient connected to nasal cannula oxygen Cardiovascular status: blood pressure returned to baseline and stable Postop Assessment: no signs of nausea or vomiting Anesthetic complications: no    Last Vitals:  Vitals:   08/15/16 1700 08/15/16 1715  BP: 139/71 129/67  Pulse: (!) 49 (!) 57  Resp: 15 17  Temp:      Last Pain:  Vitals:   08/15/16 0906  TempSrc: Oral                 [name]

## 2016-08-15 NOTE — Anesthesia Procedure Notes (Signed)
Procedure Name: Intubation Date/Time: 08/15/2016 1:27 PM Performed by: Teressa Lower Pre-anesthesia Checklist: Patient identified, Emergency Drugs available, Suction available and Patient being monitored Patient Re-evaluated:Patient Re-evaluated prior to induction Oxygen Delivery Method: Circle system utilized Preoxygenation: Pre-oxygenation with 100% oxygen Induction Type: IV induction Ventilation: Mask ventilation without difficulty Laryngoscope Size: Mac and 3 Grade View: Grade I Tube type: Oral Tube size: 7.0 mm Number of attempts: 1 Airway Equipment and Method: Stylet and Oral airway Placement Confirmation: ETT inserted through vocal cords under direct vision,  positive ETCO2 and breath sounds checked- equal and bilateral Secured at: 20 cm Tube secured with: Tape Dental Injury: Teeth and Oropharynx as per pre-operative assessment

## 2016-08-15 NOTE — Transfer of Care (Signed)
Immediate Anesthesia Transfer of Care Note  Patient: NEFTALY INZUNZA  Procedure(s) Performed: Procedure(s): LUMBAR FOUR-FIVE POSTERIOR LUMBAR INTERBODY FUSION (N/A)  Patient Location: PACU  Anesthesia Type:General  Level of Consciousness: awake, alert  and oriented  Airway & Oxygen Therapy: Patient Spontanous Breathing and Patient connected to nasal cannula oxygen  Post-op Assessment: Report given to RN and Post -op Vital signs reviewed and stable  Post vital signs: Reviewed and stable  Last Vitals:  Vitals:   08/15/16 0906  BP: (!) 171/71  Pulse: 64  Resp: 18  Temp: 36.5 C    Last Pain:  Vitals:   08/15/16 0906  TempSrc: Oral      Patients Stated Pain Goal: 2 (08/15/16 0945)  Complications: No apparent anesthesia complications

## 2016-08-15 NOTE — Op Note (Signed)
08/15/2016  4:30 PM  PATIENT:  Donna Jacobs  56 y.o. female  PRE-OPERATIVE DIAGNOSIS:  Spondylolisthesis L4-5, lateral listhesis L4-5, scoliosis, spinal stenosis, back and leg pain  POST-OPERATIVE DIAGNOSIS:  same  PROCEDURE:   1. Decompressive lumbar laminectomy L4-5 requiring more work than would be required for a simple exposure of the disk for PLIF in order to adequately decompress the neural elements and address the spinal stenosis 2. Posterior lumbar interbody fusion L4-5 using porous titanium interbody cages packed with morcellized allograft and autograft 3. Posterior fixation L4-5 using ATEC cortical pedicle screws.  4. Intertransverse arthrodesis L4-5 using morcellized autograft and allograft.  SURGEON:  Marikay Alar, MD  ASSISTANTSAdelene Idler FNP  ANESTHESIA:  General  EBL: 200 ml  Total I/O In: 1000 [I.V.:1000] Out: 1050 [Urine:850; Blood:200]  BLOOD ADMINISTERED:none  DRAINS: none   INDICATION FOR PROCEDURE: This patient presented with back and leg pain. Imaging revealed significant scoliosis with lateral listhesis and spondylolisthesis L4-5 with spinal stenosis. The patient tried a reasonable attempt at conservative medical measures without relief. I recommended decompression and instrumented fusion to address the stenosis as well as the segmental stability.  Patient understood the risks, benefits, and alternatives and potential outcomes and wished to proceed.  PROCEDURE DETAILS:  The patient was brought to the operating room. After induction of generalized endotracheal anesthesia the patient was rolled into the prone position on chest rolls and all pressure points were padded. The patient's lumbar region was cleaned and then prepped with DuraPrep and draped in the usual sterile fashion. Anesthesia was injected and then a dorsal midline incision was made and carried down to the lumbosacral fascia. The fascia was opened and the paraspinous musculature was taken down in a  subperiosteal fashion to expose the L4-5. A self-retaining retractor was placed. Intraoperative fluoroscopy confirmed my level, and I started with placement of the L4 cortical pedicle screws. The pedicle screw entry zones were identified utilizing surface landmarks and  AP and lateral fluoroscopy. I scored the cortex with the high-speed drill and then used the hand drill to drill an upward and outward direction into the pedicle. I then tapped line to line, and the tap was also monitored. I then placed a 5.5 x 35 mm cortical pedicle screw into the pedicles of L4 bilaterally. I then turned my attention to the decompression and complete lumbar laminectomies, hemi- facetectomies, and foraminotomies were performed at L4-5. The patient had significant spinal stenosis and this required more work than would be required for a simple exposure of the disc for posterior lumbar interbody fusion. Much more generous decompression was undertaken in order to adequately decompress the neural elements and address the patient's leg pain. The yellow ligament was removed to expose the underlying dura and nerve roots, and generous foraminotomies were performed to adequately decompress the neural elements. Both the exiting and traversing nerve roots were decompressed on both sides until a coronary dilator passed easily along the nerve roots. Once the decompression was complete, I turned my attention to the posterior lower lumbar interbody fusion. The epidural venous vasculature was coagulated and cut sharply. Disc space was incised and the initial discectomy was performed with pituitary rongeurs. The disc space was distracted with sequential distractors to a height of 9 mm. We then used a series of scrapers and shavers to prepare the endplates for fusion. The midline was prepared with Epstein curettes. Once the complete discectomy was finished, we packed an appropriate sized porous titanium interbody cage with local autograft and  morcellized allograft, gently retracted the nerve root, and tapped the cage into position at L4-5.  The midline between the cages was packed with morselized autograft and allograft. We then turned our attention to the placement of the lower pedicle screws. The pedicle screw entry zones were identified utilizing surface landmarks and fluoroscopy. I drilled into each pedicle utilizing the hand drill, and tapped each pedicle with the appropriate tap. We palpated with a ball probe to assure no break in the cortex. We then placed 5.0 x 35 mm cortical pedicle screws into the pedicles bilaterally at L5. We then decorticated the transverse processes and laid a mixture of morcellized autograft and allograft out over these to perform intertransverse arthrodesis at L4-5. We then placed lordotic rods into the multiaxial screw heads of the pedicle screws and locked these in position with the locking caps and anti-torque device. We then checked our construct with AP and lateral fluoroscopy. Irrigated with copious amounts of bacitracin-containing saline solution. Inspected the nerve roots once again to assure adequate decompression, lined to the dura with Gelfoam, and closed the muscle and the fascia with 0 Vicryl. Closed the subcutaneous tissues with 2-0 Vicryl and subcuticular tissues with 3-0 Vicryl. The skin was closed with benzoin and Steri-Strips. Dressing was then applied, the patient was awakened from general anesthesia and transported to the recovery room in stable condition. At the end of the procedure all sponge, needle and instrument counts were correct.   PLAN OF CARE: admit to inpatient  PATIENT DISPOSITION:  PACU - hemodynamically stable.   Delay start of Pharmacological VTE agent (>24hrs) due to surgical blood loss or risk of bleeding:  yes

## 2016-08-15 NOTE — Anesthesia Preprocedure Evaluation (Addendum)
Anesthesia Evaluation  Patient identified by MRN, date of birth, ID band Patient awake    Reviewed: Allergy & Precautions, NPO status , Patient's Chart, lab work & pertinent test results  Airway Mallampati: II  TM Distance: >3 FB Neck ROM: Full    Dental no notable dental hx. (+) Teeth Intact, Dental Advisory Given   Pulmonary former smoker,    Pulmonary exam normal breath sounds clear to auscultation       Cardiovascular Normal cardiovascular exam Rhythm:Regular Rate:Normal     Neuro/Psych    GI/Hepatic   Endo/Other    Renal/GU      Musculoskeletal   Abdominal   Peds  Hematology   Anesthesia Other Findings   Reproductive/Obstetrics                           Anesthesia Physical Anesthesia Plan  ASA: II  Anesthesia Plan: General   Post-op Pain Management:    Induction: Intravenous  PONV Risk Score and Plan: Ondansetron and Dexamethasone  Airway Management Planned: Oral ETT  Additional Equipment:   Intra-op Plan:   Post-operative Plan: Extubation in OR  Informed Consent: I have reviewed the patients History and Physical, chart, labs and discussed the procedure including the risks, benefits and alternatives for the proposed anesthesia with the patient or authorized representative who has indicated his/her understanding and acceptance.   Dental advisory given  Plan Discussed with: Anesthesiologist, Surgeon and CRNA  Anesthesia Plan Comments:        Anesthesia Quick Evaluation

## 2016-08-15 NOTE — Addendum Note (Signed)
Addendum  created 08/15/16 1819 by Kipp Brood, MD   Sign clinical note

## 2016-08-16 MED ORDER — METHOCARBAMOL 500 MG PO TABS: 500.0000 mg | ORAL_TABLET | Freq: Four times a day (QID) | ORAL | 1 refills | Status: DC | PRN

## 2016-08-16 MED ORDER — OXYCODONE HCL 5 MG PO TABS: 5.0000 mg | ORAL_TABLET | Freq: Four times a day (QID) | ORAL | 0 refills | Status: DC | PRN

## 2016-08-16 NOTE — Discharge Instructions (Signed)

## 2016-08-16 NOTE — Progress Notes (Signed)
Pt doing well. Pt and daughter given D/C instructions with Rx, verbal understanding was provided. Pt's IV was removed prior to D/C. Pt's incision is clean and dry with no sign of infection. Pt D/C'd home via wheelchair @ 0945 per MD order. Pt is stable @ D/C and has no other needs this time. Rema Fendt, RN

## 2016-08-16 NOTE — Progress Notes (Signed)
Occupational Therapy Evaluation Patient Details Name: Donna Jacobs MRN: [id number] DOB: 01/19/1961 Today's Date: 08/16/2016    History of Present Illness Pt is a 56 y/o female with a history of scoliosis, who presents s/p L4-L5 PLIF on 08/15/16.   Clinical Impression   Completed all education regarding use of compensatory strategies, AE and DME for ADL while adhering to back precautions. Pt verbalized understanding. Pt safe to DC home with intermittent S.     Follow Up Recommendations  No OT follow up;Supervision - Intermittent    Equipment Recommendations  3 in 1 bedside commode    Recommendations for Other Services       Precautions / Restrictions Precautions Precautions: Fall;Back Precaution Booklet Issued: Yes (comment) Precaution Comments: Reviewed handout in detail, and pt was cued for precautions during functional mobility.  Required Braces or Orthoses: Spinal Brace Spinal Brace: Lumbar corset;Applied in sitting position Restrictions Weight Bearing Restrictions: No      Mobility Bed Mobility Overal bed mobility: Needs Assistance Bed Mobility: Rolling;Sidelying to Sit;Sit to Sidelying Rolling: Modified independent (Device/Increase time) Sidelying to sit: Supervision     Sit to sidelying: Supervision .   Transfers Overall transfer level: Needs assistance Equipment used: None Transfers: Sit to/from Stand Sit to Stand: Supervision              Balance Overall balance assessment: Needs assistance;No apparent balance deficits (not formally assessed)                                         ADL either performed or assessed with clinical judgement   ADL Overall ADL's : Needs assistance/impaired                                     Functional mobility during ADLs: Supervision/safety General ADL Comments: completed education regarding back precautions, compensatory techniques and use of DME. Pt states she palns to use her  friend's 3in1/ Educted pt on use of AE to assist withLB ADL and pericare. Educated on home set up and reducing risk of fall.sPt verbalized understanding.     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 Not formally assessed however it is visually apparent.   Cervical / Trunk Assessment Cervical / Trunk Assessment: Other exceptions Cervical / Trunk Exceptions: History of scoliosis; s/p surgery   Communication Communication Communication: No difficulties   Cognition Arousal/Alertness: Awake/alert Behavior During Therapy: WFL for tasks assessed/performed Overall Cognitive Status: Within Functional Limits for tasks assessed                                     General Comments       Exercises     Shoulder Instructions      Home Living Family/patient expects to be discharged to:: Private residence Living  Arrangements: Parent;Spouse/significant other Available Help at Discharge: Family;Available 24 hours/day Type of Home: House Home Access: Stairs to enter [employer] of Steps: 3 Entrance Stairs-Rails: Right;Left Home Layout: One level     Bathroom Shower/Tub: Producer, television/film/video: Standard Bathroom Accessibility: Yes How Accessible: Accessible via walker Home Equipment: Bedside commode (States she can get from her Aunt)          Prior Functioning/Environment Level of Independence: Independent        Comments: Significant limp. 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 that she was  unaware of        OT Problem List: Decreased activity tolerance;Decreased safety awareness;Decreased knowledge of use of DME or AE;Decreased knowledge of precautions      OT Treatment/Interventions:      OT Goals(Current goals can be found in the care plan section) Acute Rehab OT Goals Patient Stated Goal: feel better OT Goal Formulation: All assessment and education complete, DC therapy  OT Frequency:     Barriers to D/C:            Co-evaluation              AM-PAC PT "6 Clicks" Daily Activity     Outcome Measure Help from another person eating meals?: None Help from another person taking care of personal grooming?: None Help from another person toileting, which includes using toliet, bedpan, or urinal?: A Little Help from another person bathing (including washing, rinsing, drying)?: A Little Help from another person to put on and taking off regular upper body clothing?: None Help from another person to put on and taking off regular lower body clothing?: A Little 6 Click Score: 21   End of Session Equipment Utilized During Treatment: Back brace Nurse Communication: Mobility status  Activity Tolerance: Patient tolerated treatment well Patient left: in chair;with call bell/phone within reach;with family/visitor present  OT Visit Diagnosis: Muscle weakness (generalized) (M62.81);Pain Pain - part of body:  (back)                Time: 0626-9485 OT Time Calculation (min): 14 min Charges:  OT General Charges $OT Visit: 1 Procedure OT Evaluation $OT Eval Low Complexity: 1 Procedure G-Codes:     Donna Jacobs, OT/L  [phone] 08/16/2016  Donna Jacobs,Donna Jacobs 08/16/2016, 2:09 PM

## 2016-08-16 NOTE — Progress Notes (Signed)
Pt received 3-n-1 from [facility] prior to D/C. Bear Osten, RN  

## 2016-08-16 NOTE — Discharge Summary (Signed)
Physician Discharge Summary  Patient ID: Donna Jacobs MRN: [id number] DOB/AGE: 54/26/1962 54 y.o.  Admit date: 08/15/2016 Discharge date: 08/16/2016  Admission Diagnoses: spondylolisthesis L4-5    Discharge Diagnoses: same   Discharged Condition: good  Hospital Course: The patient was admitted on 08/15/2016 and taken to the operating room where the patient underwent PLIF L4=5. The patient tolerated the procedure well and was taken to the recovery room and then to the floor in stable condition. The hospital course was routine. There were no complications. The wound remained clean dry and intact. Pt had appropriate back soreness. No complaints of leg pain or new N/T/W. The patient remained afebrile with stable vital signs, and tolerated a regular diet. The patient continued to increase activities, and pain was well controlled with oral pain medications.   Consults: None  Significant Diagnostic Studies:  Results for orders placed or performed during the hospital encounter of 08/09/16  Surgical pcr screen  Result Value Ref Range   MRSA, PCR NEGATIVE NEGATIVE   Staphylococcus aureus POSITIVE (A) NEGATIVE  Basic metabolic panel  Result Value Ref Range   Sodium 140 135 - 145 mmol/L   Potassium 3.7 3.5 - 5.1 mmol/L   Chloride 106 101 - 111 mmol/L   CO2 25 22 - 32 mmol/L   Glucose, Bld 93 65 - 99 mg/dL   BUN 10 6 - 20 mg/dL   Creatinine, Ser 8.78 0.44 - 1.00 mg/dL   Calcium 9.0 8.9 - 67.6 mg/dL   GFR calc non Af Amer >60 >60 mL/min   GFR calc Af Amer >60 >60 mL/min   Anion gap 9 5 - 15  CBC WITH DIFFERENTIAL  Result Value Ref Range   WBC 7.1 4.0 - 10.5 K/uL   RBC 4.70 3.87 - 5.11 MIL/uL   Hemoglobin 13.9 12.0 - 15.0 g/dL   HCT 72.0 94.7 - 09.6 %   MCV 88.1 78.0 - 100.0 fL   MCH 29.6 26.0 - 34.0 pg   MCHC 33.6 30.0 - 36.0 g/dL   RDW 28.3 66.2 - 94.7 %   Platelets 229 150 - 400 K/uL   Neutrophils Relative % 67 %   Neutro Abs 4.7 1.7 - 7.7 K/uL   Lymphocytes Relative 24 %   Lymphs Abs 1.7 0.7 - 4.0 K/uL   Monocytes Relative 6 %   Monocytes Absolute 0.5 0.1 - 1.0 K/uL   Eosinophils Relative 2 %   Eosinophils Absolute 0.2 0.0 - 0.7 K/uL   Basophils Relative 1 %   Basophils Absolute 0.1 0.0 - 0.1 K/uL  Protime-INR  Result Value Ref Range   Prothrombin Time 13.8 11.4 - 15.2 seconds   INR 1.06   Type and screen [facility]  Result Value Ref Range   ABO/RH(D) A POS    Antibody Screen NEG    Sample Expiration 08/23/2016    Extend sample reason NO TRANSFUSIONS OR PREGNANCY IN THE PAST 3 MONTHS   ABO/Rh  Result Value Ref Range   ABO/RH(D) A POS     Chest 2 View  Result Date: 08/09/2016 CLINICAL DATA:  Pre-op respiratory exam for lumbar spine fusion. Lumbar spondylolisthesis. EXAM: CHEST  2 VIEW COMPARISON:  None. FINDINGS: The heart size and mediastinal contours are within normal limits. Both lungs are clear. The visualized skeletal structures are unremarkable. IMPRESSION: No active cardiopulmonary disease. Electronically Signed   By: Myles Rosenthal M.D.   On: 08/09/2016 21:10   Dg Lumbar Spine 2-3 Views  Result Date: 08/15/2016  CLINICAL DATA:  L4-L5 PLIF EXAM: LUMBAR SPINE - 2-3 VIEW; DG C-ARM 61-120 MIN COMPARISON:  Lumbar spine radiographs 07/16/2016, CT lumbar spine 06/15/2016 FLUOROSCOPY TIME:  1 minutes 55 seconds Images submitted:  2 FINDINGS: 5 lumbar vertebra labeled on prior CT lumbar spine. Images demonstrate diffuse osseous demineralization. BILATERAL pedicle screws and posterior bars identified at L4-L5. Disc prosthesis at L4-L5 disc space. No fracture or subluxation. IMPRESSION: Posterior fusion L4-L5 as above. Electronically Signed   By: Ulyses Southward M.D.   On: 08/15/2016 17:09   Dg C-arm 1-60 Min  Result Date: 08/15/2016 CLINICAL DATA:  L4-L5 PLIF EXAM: LUMBAR SPINE - 2-3 VIEW; DG C-ARM 61-120 MIN COMPARISON:  Lumbar spine radiographs 07/16/2016, CT lumbar spine 06/15/2016 FLUOROSCOPY TIME:  1 minutes 55 seconds Images submitted:  2  FINDINGS: 5 lumbar vertebra labeled on prior CT lumbar spine. Images demonstrate diffuse osseous demineralization. BILATERAL pedicle screws and posterior bars identified at L4-L5. Disc prosthesis at L4-L5 disc space. No fracture or subluxation. IMPRESSION: Posterior fusion L4-L5 as above. Electronically Signed   By: Ulyses Southward M.D.   On: 08/15/2016 17:09    Antibiotics:  Anti-infectives    Start     Dose/Rate Route Frequency Ordered Stop   08/15/16 2200  ceFAZolin (ANCEF) IVPB 2g/100 mL premix     2 g 200 mL/hr over 30 Minutes Intravenous Every 8 hours 08/15/16 1646 08/16/16 0556   08/15/16 1617  vancomycin (VANCOCIN) powder  Status:  Discontinued       As needed 08/15/16 1617 08/15/16 1635   08/15/16 1401  bacitracin 50,000 Units in sodium chloride irrigation 0.9 % 500 mL irrigation  Status:  Discontinued       As needed 08/15/16 1401 08/15/16 1635   08/15/16 0941  ceFAZolin (ANCEF) IVPB 2g/100 mL premix     2 g 200 mL/hr over 30 Minutes Intravenous On call to O.R. 08/15/16 0941 08/15/16 1335      Discharge Exam: Blood pressure 100/60, pulse (!) 58, temperature 98.5 F (36.9 C), temperature source Oral, resp. rate 18, height 5' (1.524 m), weight 75.1 kg (165 lb 8 oz), SpO2 99 %. Neurologic: Grossly normal Dressing dry  Discharge Medications:   Allergies as of 08/16/2016      Reactions   Codeine Nausea And Vomiting      Medication List    TAKE these medications   acetaminophen 325 MG tablet Commonly known as:  TYLENOL Take 325 mg by mouth every 6 (six) hours as needed for mild pain or headache.   methocarbamol 500 MG tablet Commonly known as:  ROBAXIN Take 1 tablet (500 mg total) by mouth every 6 (six) hours as needed for muscle spasms.   oxyCODONE 5 MG immediate release tablet Commonly known as:  Oxy IR/ROXICODONE Take 1-2 tablets (5-10 mg total) by mouth every 6 (six) hours as needed for breakthrough pain.            Durable Medical Equipment        Start      Ordered   08/15/16 1839  DME Walker rolling  Once    Question:  Patient needs a walker to treat with the following condition  Answer:  S/P lumbar fusion   08/15/16 1838   08/15/16 1839  DME 3 n 1  Once     08/15/16 1838      Disposition: home   Final Dx: PLIF L4-5  Discharge Instructions     Remove dressing in 72 hours    Complete  by:  As directed    Call MD for:  difficulty breathing, headache or visual disturbances    Complete by:  As directed    Call MD for:  persistant nausea and vomiting    Complete by:  As directed    Call MD for:  redness, tenderness, or signs of infection (pain, swelling, redness, odor or green/yellow discharge around incision site)    Complete by:  As directed    Call MD for:  severe uncontrolled pain    Complete by:  As directed    Call MD for:  temperature >100.4    Complete by:  As directed    Diet - low sodium heart healthy    Complete by:  As directed    Increase activity slowly    Complete by:  As directed          Signed: Reyaansh Merlo S 08/16/2016, 7:39 AM

## 2016-08-16 NOTE — Evaluation (Signed)
Physical Therapy Evaluation Patient Details Name: Donna Jacobs MRN: [id number] DOB: July 04, 1960 Today's Date: 08/16/2016   History of Present Illness  Pt is a 56 y/o female with a history of scoliosis, who presents s/p L4-L5 PLIF on 08/15/16.  Clinical Impression  Pt admitted with above diagnosis. Pt currently with functional limitations due to the deficits listed below (see PT Problem List). At the time of PT eval pt was able to perform transfers and ambulation with min guard assist to supervision for safety. Pt with noted antalgic gait pattern 2 LLD. Recommended pt discuss with MD when she can see outpatient PT regarding this. Pt will benefit from skilled PT to increase their independence and safety with mobility to allow discharge to the venue listed below.       Follow Up Recommendations Outpatient PT;Supervision for mobility/OOB (for eval of LLD when appropriate per post-op protocol)    Equipment Recommendations  None recommended by PT    Recommendations for Other Services       Precautions / Restrictions Precautions Precautions: Fall;Back Precaution Booklet Issued: Yes (comment) Precaution Comments: Reviewed handout in detail, and pt was cued for precautions during functional mobility.  Required Braces or Orthoses: Spinal Brace Spinal Brace: Lumbar corset;Applied in sitting position Restrictions Weight Bearing Restrictions: No      Mobility  Bed Mobility Overal bed mobility: Needs Assistance Bed Mobility: Rolling;Sidelying to Sit;Sit to Sidelying Rolling: Modified independent (Device/Increase time) Sidelying to sit: Min guard     Sit to sidelying: Supervision General bed mobility comments: VC's for proper log roll technique. Hands-on guarding when sidelying to sit.   Transfers Overall transfer level: Needs assistance Equipment used: None Transfers: Sit to/from Stand Sit to Stand: Supervision         General transfer comment: Close supervision for safety as pt  powered up to full standing position. Appeared slightly unsteady but no assist required  Ambulation/Gait Ambulation/Gait assistance: Min guard Ambulation Distance (Feet): 200 Feet Assistive device: None Gait Pattern/deviations: Step-through pattern;Decreased stride length;Antalgic Gait velocity: Decreased Gait velocity interpretation: Below normal speed for age/gender General Gait Details: Hands-on guarding for safety secondary to limp and appearance of unsteadiness.   Stairs Stairs: Yes Stairs assistance: Min guard Stair Management: One rail Right;Step to pattern;Forwards (HHA on L) Number of Stairs: 4 General stair comments: VC's for sequencing and safety. Pt was able to complete without difficulty.   Wheelchair Mobility    Modified Rankin (Stroke Patients Only)       Balance Overall balance assessment: Needs assistance Sitting-balance support: No upper extremity supported;Feet supported Sitting balance-Leahy Scale: Good     Standing balance support: No upper extremity supported;During functional activity Standing balance-Leahy Scale: Fair                               Pertinent Vitals/Pain Pain Assessment: 0-10 Pain Score: 6  Pain Location: Incision site Pain Descriptors / Indicators: Operative site guarding;Discomfort Pain Intervention(s): Limited activity within patient's tolerance;Monitored during session;Repositioned    Home Living Family/patient expects to be discharged to:: Private residence Living Arrangements: Parent;Spouse/significant other Available Help at Discharge: Family;Available 24 hours/day Type of Home: House Home Access: Stairs to enter Entrance Stairs-Rails: Doctor, general practice of Steps: 3 Home Layout: One level Home Equipment: Bedside commode (States she can get from her Aunt)      Prior Function Level of Independence: Independent         Comments: Significant limp. 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 Not formally assessed however it is visually apparent.    Cervical / Trunk Assessment Cervical / Trunk Assessment: Other exceptions Cervical / Trunk Exceptions: History of scoliosis; s/p surgery  Communication   Communication: No difficulties  Cognition Arousal/Alertness: Awake/alert Behavior During Therapy: WFL for tasks assessed/performed Overall Cognitive Status: Within Functional Limits for tasks assessed                                        General Comments      Exercises     Assessment/Plan    PT Assessment Patient needs continued PT services  PT Problem List Decreased strength;Decreased range of motion;Decreased activity tolerance;Decreased balance;Decreased mobility;Decreased knowledge of use of DME;Decreased safety awareness;Decreased knowledge of precautions;Pain       PT Treatment Interventions DME instruction;Gait training;Stair training;Functional mobility training;Therapeutic activities;Therapeutic exercise;Neuromuscular re-education;Patient/family education    PT Goals (Current goals can be found in the Care Plan section)  Acute Rehab PT Goals Patient Stated Goal: Get rid of the limp PT Goal Formulation: With patient Time For Goal Achievement: 08/23/16 Potential to Achieve Goals: Good    Frequency Min 5X/week   Barriers to discharge        Co-evaluation               AM-PAC PT "6 Clicks" Daily Activity  Outcome Measure Difficulty turning over in bed (including adjusting bedclothes, sheets and blankets)?: None Difficulty moving from lying on back to sitting on the side of the bed? : A Little Difficulty sitting down on and standing up  from a chair with arms (e.g., wheelchair, bedside commode, etc,.)?: A Little Help needed moving to and from a bed to chair (including a wheelchair)?: A Little Help needed walking in hospital room?: A Little Help needed climbing 3-5 steps with a railing? : A Little 6 Click Score: 19    End of Session Equipment Utilized During Treatment: Gait belt;Back brace Activity Tolerance: Patient tolerated treatment well Patient left: in chair;with call bell/phone within reach;with family/visitor present Nurse Communication: Mobility status PT Visit Diagnosis: Unsteadiness on feet (R26.81);Pain Pain - part of body:  (back)    Time: 0350-0938 PT Time Calculation (min) (ACUTE ONLY): 31 min   Charges:   PT Evaluation $PT Eval Moderate Complexity: 1 Procedure PT Treatments $Gait Training: 8-22 mins   PT G Codes:        Conni Slipper, PT, DPT Acute Rehabilitation Services Pager: [phone]   Marylynn Pearson 08/16/2016, 9:02 AM

## 2018-10-30 ENCOUNTER — Telehealth: Payer: Self-pay

## 2018-10-30 ENCOUNTER — Ambulatory Visit: Payer: 59 | Admitting: Neurology

## 2018-10-30 NOTE — Telephone Encounter (Signed)
Pt did not show for their appt with Dr. Athar today.  

## 2018-11-04 ENCOUNTER — Encounter: Payer: Self-pay | Admitting: Neurology

## 2018-12-16 ENCOUNTER — Encounter: Payer: Self-pay | Admitting: Neurology

## 2018-12-16 ENCOUNTER — Ambulatory Visit (INDEPENDENT_AMBULATORY_CARE_PROVIDER_SITE_OTHER): Payer: 59 | Admitting: Neurology

## 2018-12-16 ENCOUNTER — Other Ambulatory Visit: Payer: Self-pay

## 2018-12-16 VITALS — BP 169/94 | HR 89 | Ht 62.0 in | Wt 158.0 lb

## 2018-12-16 DIAGNOSIS — R413 Other amnesia: Secondary | ICD-10-CM

## 2018-12-16 DIAGNOSIS — Z820 Family history of epilepsy and other diseases of the nervous system: Secondary | ICD-10-CM | POA: Diagnosis not present

## 2018-12-16 NOTE — Patient Instructions (Signed)
You have complaints of memory loss: memory loss or changes in cognitive function can have many reasons and does not always mean you have dementia. Conditions that can contribute to subjective or objective memory loss include: depression, stress, poor sleep from insomnia or sleep apnea, dehydration, fluctuation in blood sugar values, thyroid or electrolyte dysfunction and certain vitamin deficiencies. Dementia can be caused by stroke, brain atherosclerosis or brain vascular disease due to vascular risk factors (smoking, high blood pressure, high cholesterol, obesity and uncontrolled diabetes), certain degenerative brain disorders (including Parkinson's disease and Multiple sclerosis) and by Alzheimer's disease or other, more rare and sometimes hereditary causes. We will do some additional testing: blood work (which has been done recently already) and we will do a brain scan. We will not start medication as yet. We will also request a formal cognitive test called neuropsychological evaluation which is done by a licensed neuropsychologist. We will make a referral in that regard. We will call you with brain scan test results and monitor your symptoms.We will consider medication for dementia. I am worried about your driving, please have your family monitor it and I would suggest at this point only local roads, familiar routes, no nighttime and no highway driving.

## 2018-12-16 NOTE — Progress Notes (Signed)
Subjective:    Patient ID: Donna Jacobs is a 58 y.o. female.  HPI     Huston Foley, MD, PhD [facility] [address] [address] [place], [place] [address]  Dear Shanda Bumps, I saw your patient, Donna Jacobs, upon your kind request, in my neurologic clinic today for initial consultation of her memory loss.  The patient is accompanied by her friend, Donna Jacobs today.  She missed an appointment on 10/30/2018.  As you know, Donna Jacobs is a 58 year old right-handed woman with an underlying medical history of vitamin D deficiency, hyperlipidemia, arthritis, former smoking and obesity, who reports memory loss for the past 3+ years.  The patient has had forgetfulness and often misplaces things.  She has been driving.  Her friend has been able to observe her driving and feels that she has done well with local and familiar routes.  However, patient does report that she has gotten lost driving at times.  She continues to work.  She works for [employer], [employer], and has worked for the same company for over 30 years. She has had some mood issues including frustration, her husband has also been frustrated with her as I understand.  She lives with her husband, she has 2 grown sons.  She has 3 grandsons.  She quit smoking over 20 years ago.  She has a remote history of drug abuse and was in rehab when she was a teenager or young adults.  She admits to trying cocaine but denies any IV drug use, she denies any recent drug use or relapse, she has smoked marijuana as well.  She does not hydrate very well with water she admits.  She likes to drink Diet Coke.  She is not sure how many cans she drinks per day.  She does not currently drink any alcohol.  She denies any heavy alcohol use or alcohol abuse in the past.  She does not snore.  Donna Jacobs has not noticed any gasping sounds or snoring sounds when they have shared a room before on a trip. She reports that her paternal grandmother had Alzheimer's  disease.  Paternal aunt had no dementia.  The patient is the oldest of 3, she has a brother and a sister, neither 1 have memory loss.  I reviewed your office note from 10/27/2018.  She was referred to rheumatology at the time for a presumed diagnosis of rheumatoid arthritis.  She was also referred to orthopedics for leg length discrepancy.  She had blood work at the time including CBC with differential, B12, TSH, vitamin D.  Her B12 level was 257, vitamin D low at 21.9, TSH was normal, CBC with differential was unremarkable.  She is followed by rheumatology for her arthritis and prior diagnosis of rheumatoid arthritis.  Her Past Medical History Is Significant For: Past Medical History:  Diagnosis Date  . Arthritis    Lumbar - spondylisthesis   . Hyperlipidemia    " I use to take med. for cholesteol, but only for a short time"    Her Past Surgical History Is Significant For: Past Surgical History:  Procedure Laterality Date  . ABDOMINAL HYSTERECTOMY    . TUBAL LIGATION    . VAGINAL DELIVERY     x2    Her Family History Is Significant For: History reviewed. No pertinent family history.  Her Social History Is Significant For: Social History   Socioeconomic History  . Marital status: Married    Spouse name: Not on file  .  Number of children: Not on file  . Years of education: Not on file  . Highest education level: Not on file  Occupational History  . Not on file  Social Needs  . Financial resource strain: Not on file  . Food insecurity    Worry: Not on file    Inability: Not on file  . Transportation needs    Medical: Not on file    Non-medical: Not on file  Tobacco Use  . Smoking status: Former Smoker    Types: Cigarettes    Quit date: 08/10/1975    Years since quitting: 43.3  . Smokeless tobacco: Never Used  Substance and Sexual Activity  . Alcohol use: No  . Drug use: No  . Sexual activity: Not on file  Lifestyle  . Physical activity    Days per week: Not on file     Minutes per session: Not on file  . Stress: Not on file  Relationships  . Social Musician on phone: Not on file    Gets together: Not on file    Attends religious service: Not on file    Active member of club or organization: Not on file    Attends meetings of clubs or organizations: Not on file    Relationship status: Not on file  Other Topics Concern  . Not on file  Social History Narrative  . Not on file    Her Allergies Are:  Allergies  Allergen Reactions  . Lipitor [Atorvastatin Calcium]   . Codeine Nausea And Vomiting  :   Her Current Medications Are:  Outpatient Encounter Medications as of 12/16/2018  Medication Sig  . acetaminophen (TYLENOL) 325 MG tablet Take 325 mg by mouth every 6 (six) hours as needed for mild pain or headache.  . methocarbamol (ROBAXIN) 500 MG tablet Take 1 tablet (500 mg total) by mouth every 6 (six) hours as needed for muscle spasms.  Marland Kitchen oxyCODONE (OXY IR/ROXICODONE) 5 MG immediate release tablet Take 1-2 tablets (5-10 mg total) by mouth every 6 (six) hours as needed for breakthrough pain.   No facility-administered encounter medications on file as of 12/16/2018.   :   Review of Systems:  Out of a complete 14 point review of systems, all are reviewed and negative with the exception of these symptoms as listed below:  Review of Systems  Neurological:       Pt presents today to discuss her progressive memory loss.    Objective:  Neurological Exam  Physical Exam Physical Examination:   Vitals:   12/16/18 1439  BP: (!) 169/94  Pulse: 89   General Examination: The patient is a very pleasant 58 y.o. female in no acute distress. She appears well-developed and well-nourished and well groomed.   HEENT: Normocephalic, atraumatic, pupils are equal, round and reactive to light and accommodation. Funduscopic exam is normal with sharp disc margins noted. Extraocular tracking is good without limitation to gaze excursion or  nystagmus noted. Normal smooth pursuit is noted. Hearing is grossly intact. Face is symmetric with normal facial animation and normal facial sensation. Speech is clear with no dysarthria noted. There is no hypophonia. There is no lip, neck/head, jaw or voice tremor. Neck is supple with full range of passive and active motion. There are no carotid bruits on auscultation. Oropharynx exam reveals: moderate mouth dryness, adequate dental hygiene. Tongue protrudes centrally and palate elevates symmetrically.   Chest: Clear to auscultation without wheezing, rhonchi or crackles noted.  Heart:  S1+S2+0, regular and normal without murmurs, rubs or gallops noted.   Abdomen: Soft, non-tender and non-distended with normal bowel sounds appreciated on auscultation.  Extremities: There is no pitting edema in the distal lower extremities bilaterally. Pedal pulses are intact.  Skin: Warm and dry without trophic changes noted.  Musculoskeletal: exam reveals no obvious joint deformities, tenderness or joint swelling or erythema.   Neurologically:  Mental status: The patient is awake, alert and oriented in all 4 spheres. Her immediate and remote memory, attention, language skills and fund of knowledge are Impaired, she is unable to give details on her history and her friend provides most of the details. There is no evidence of aphasia, agnosia, apraxia or anomia. Speech is clear with normal prosody and enunciation. Thought process is linear. Mood is normal and affect is constricted.   On 12/16/2018: MMSE: 10/30, CDT: 0/4, AFT: 4/min.   Cranial nerves II - XII are as described above under HEENT exam. In addition: shoulder shrug is normal with equal shoulder height noted. Motor exam: Normal bulk, strength and tone is noted. There is no drift, tremor or rebound. Romberg is negative. Reflexes are 2+ throughout. Babinski: Toes are flexor bilaterally. Fine motor skills and coordination: intact with normal finger taps,  normal hand movements, normal rapid alternating patting, normal foot taps and normal foot agility.  Cerebellar testing: No dysmetria or intention tremor on finger to nose testing. Heel to shin is unremarkable bilaterally. There is no truncal or gait ataxia.  Sensory exam: intact to light touch in the upper and lower extremities.  Gait, station and balance: She stands up with minimal difficulty, posture is stooped forward, she has increased in lumbar kyphosis.  She walks with a slight limp, uneven hip height noted, slight waddle.  Assessment and Plan:  In summary, Donna Jacobs is a very pleasant 58 y.o.-year old female with an underlying medical history of vitamin D deficiency, hyperlipidemia, arthritis, former smoking and obesity, who Presents for evaluation of her memory loss of at least 3 years duration.  Symptoms are progressive.  She reports a family history of Alzheimer's disease on her father's side, particularly paternal grandmother.She does have a significantly abnormal memory score today.  She had some recent blood work in your office.  I would like to Proceed with additional testing in the form of brain MRI and neuropsychological evaluation.  I am worried about her driving ability.  She is highly cautioned about her driving, her friend has observed her driving and felt she did well.  The patient is advised to continue to pursue healthy lifestyle, she is encouraged to stay better hydrated with water.  She may not be hydrating well enough. We talked about memory medication.  I would like to proceed at least with a brain MRI for now.  I plan to see her back after testing, soon.  She may have young onset Alzheimer's disease.  We talked about the importance of stress reduction.I answered all their questions today and the patient and her friend Donna Jacobs were in agreement peer Thank you very much for allowing me to participate in the care of this nice patient. If I can be of any further assistance to you  please do not hesitate to call me at [phone].  Sincerely,   Star Age, MD, PhD

## 2018-12-24 ENCOUNTER — Telehealth: Payer: Self-pay | Admitting: Neurology

## 2018-12-24 NOTE — Telephone Encounter (Signed)
Donna Jacobs(friend on [place]) is asking for a call to schedule pt's MRI, please call

## 2018-12-29 NOTE — Telephone Encounter (Signed)
Phone rep checked office voicemails;  Someone by the name of Leroy Kennedy stated pt is still waiting to be scheduled for her MRI, Leroy Kennedy stated tomorrow will make 2 weeks that they have been waiting for pt to be scheduled. This voicemail was left @12 :14pm

## 2018-12-29 NOTE — Telephone Encounter (Signed)
Per phone note, we reached out to this patient on 12/24/18 and LVM. I returned the call and scheduled MRI for tomorrow.

## 2018-12-30 ENCOUNTER — Other Ambulatory Visit: Payer: Self-pay

## 2018-12-30 ENCOUNTER — Ambulatory Visit: Payer: 59

## 2018-12-30 DIAGNOSIS — R413 Other amnesia: Secondary | ICD-10-CM | POA: Diagnosis not present

## 2019-01-01 NOTE — Progress Notes (Signed)
pls call pt: Brain scan without contrast shows obvious acute abnormality or significant volume loss called atrophy.  As discussed, I would like to proceed with a formal neuropsychological evaluation.  I had made a referral, I see a note in the chart where the psychology office were trying to reach the patient to schedule the appointment.  Please advise patient to call back to schedule the appointment as they were unable to reach her. Dr. Melvyn Novas' office at [facility] neuro.

## 2019-01-06 NOTE — Telephone Encounter (Signed)
Correction: pls call pt: Brain scan without contrast showed NO obvious acute abnormality or significant volume loss, called atrophy. As discussed, I would like to proceed with a formal neuropsychological evaluation. I had made a referral, I see a note in the chart where the psychology office were trying to reach the patient to schedule the appointment. Please advise patient to call back to schedule the appointment as they were unable to reach her. Dr. Melvyn Novas' office at [facility] neuro.

## 2019-01-06 NOTE — Telephone Encounter (Signed)
I called and spoke to her friend, [employer], ok per DPR.  I relayed that pts MRI results per Dr. Rexene Alberts  MRI showd NO obvious acute abnormality or atrophy (volume loss).  I asked about neuropsych eval and she stated she did not get call about this.   I gave her name [facility], Dr. Melvyn Novas, and his phone # to call and set up referral appt.  She verbalized understanding.

## 2019-03-19 ENCOUNTER — Ambulatory Visit: Payer: 59 | Admitting: Neurology

## 2019-05-28 ENCOUNTER — Encounter: Payer: Self-pay | Admitting: Psychology

## 2019-05-28 ENCOUNTER — Other Ambulatory Visit: Payer: Self-pay

## 2019-05-28 ENCOUNTER — Ambulatory Visit (INDEPENDENT_AMBULATORY_CARE_PROVIDER_SITE_OTHER): Payer: 59 | Admitting: Psychology

## 2019-05-28 ENCOUNTER — Ambulatory Visit: Payer: 59 | Admitting: Psychology

## 2019-05-28 DIAGNOSIS — F0390 Unspecified dementia without behavioral disturbance: Secondary | ICD-10-CM

## 2019-05-28 DIAGNOSIS — E785 Hyperlipidemia, unspecified: Secondary | ICD-10-CM | POA: Insufficient documentation

## 2019-05-28 DIAGNOSIS — M069 Rheumatoid arthritis, unspecified: Secondary | ICD-10-CM | POA: Insufficient documentation

## 2019-05-28 DIAGNOSIS — E559 Vitamin D deficiency, unspecified: Secondary | ICD-10-CM | POA: Insufficient documentation

## 2019-05-28 DIAGNOSIS — R4189 Other symptoms and signs involving cognitive functions and awareness: Secondary | ICD-10-CM

## 2019-05-28 DIAGNOSIS — F0150 Vascular dementia without behavioral disturbance: Secondary | ICD-10-CM

## 2019-05-28 DIAGNOSIS — F1911 Other psychoactive substance abuse, in remission: Secondary | ICD-10-CM | POA: Insufficient documentation

## 2019-05-28 DIAGNOSIS — N951 Menopausal and female climacteric states: Secondary | ICD-10-CM | POA: Insufficient documentation

## 2019-05-28 HISTORY — DX: Unspecified dementia, unspecified severity, without behavioral disturbance, psychotic disturbance, mood disturbance, and anxiety: F03.90

## 2019-05-28 HISTORY — DX: Vascular dementia without behavioral disturbance: F01.50

## 2019-05-28 NOTE — Progress Notes (Addendum)
NEUROPSYCHOLOGICAL EVALUATION [place]. [facility] Department of Neurology  Date of Evaluation: May 28, 2019  Reason for Referral:   Donna Jacobs is a 59 y.o. right-handed Caucasian female referred by Huston Foley, M.D., to characterize her current cognitive functioning and assist with diagnostic clarity and treatment planning in the context of subjective cognitive decline and positive family history of neurodegenerative illness.   Assessment and Plan:   Clinical Impression(s): Donna Jacobs's pattern of performance is suggestive of diffuse cognitive impairment with performances consistently scoring in the exceptionally low normative range relative to age-matched peers. Basic attention represented a relative strength; however, scores remained in the well below average normative range and still represent evidence for cognitive decline. Donna Jacobs and her friend reported difficulties completing instrumental activities of daily living (ADLs) surrounding bill paying and financial management, as well as instances where she has gotten lost while driving. This, coupled with evidence for significant cognitive dysfunction described above, suggests that she meets criteria for a Major Neurocognitive Disorder (formerly "dementia") at the present time.  While she performed below expectation across performance validity measures, I believe this was due to true, diffuse, and severe cognitive dysfunction rather than poor engagement or attempts to perform poorly. As such, current results are believed to generally represent her current level of functioning. Prior neuroimaging in December 2020 was read as only having mild periventricular white matter abnormalities. However, upon my review, cerebral volume loss, particularly surrounding the parietal lobes and posterior areas of the brain, is quite apparent, especially given Donna Jacobs's relatively young age. This, combined with diffuse cognitive  impairment evidenced across cognitive testing, is worrisome for the presence of a neurodegenerative illness, particularly an early-onset Alzheimer's disease clinical presentation. Lack of evidence for asymmetric motoric dysfunction, alien limb syndrome, or myoclonus does not suggest corticobasal degeneration. Neuroimaging did not suggest pronounced vascular abnormalities or ventriculomegaly, making vascular or normal pressure hydrocephalus etiologies less likely. Behavioral characteristics are also not consistent with other types of dementia (e.g., Lewy body dementia or frontotemporal dementia) at the present time. Continued medical monitoring will be important moving forward.   Recommendations: To provide further information towards improved diagnostic clarity, I recommend that Dr. Frances Furbish refer Donna Jacobs for a lumbar puncture to assess for important biomarkers in her CSF which could be concerning for Alzheimer's disease. When updated neuroimaging (i.e., brain MRI) is performed, a sagittal view would be beneficial. A PET scan could further be beneficial in detecting metabolism patterns concerning for a degenerative illness. Additional labs to rule out reversible causes of cognitive dysfunction (e.g., undetected UTI) will also be important. I defer to Dr. Frances Furbish as to the order in which she and Donna Jacobs would like to proceed.   Donna Jacobs would likely benefit from being prescribed an acetylcholinesterase inhibitor such as Aricept/donepezil. She should discuss this with Dr. Frances Furbish.   A repeat neuropsychological evaluation could be considered in the future, especially if there is believed to be any improvement in her clinical presentation. Given the diffuse nature of severe impairments across the current evaluation, evidence for further decline would be difficult to obtain upon repeat evaluation.  Donna Jacobs is encouraged to attend to lifestyle factors for brain health (e.g., regular physical exercise, good nutrition  habits, regular participation in cognitively-stimulating activities, and general stress management techniques), which are likely to have benefits for both emotional adjustment and cognition. Optimal control of vascular risk factors (including safe cardiovascular exercise and adherence to dietary recommendations) is encouraged.  Given the  diffuse and severe nature of cognitive impairment (especially surrounding visuospatial and executive functioning), I have some concerns surrounding Donna Jacobs's ability to safely operate a motor vehicle. I would recommend her family pursuing a formalized driving evaluation. They would be encouraged to contact [employer] in [place], [place] at [phone]. Another option would be through [facility]; however, the latter would likely require a referral from a medical doctor. [facility] can be reached directly at [phone].   Should there be a progression of her current deficits over time, Donna Jacobs is unlikely to regain any independent living skills lost. Therefore, it is recommended that she remain as involved as possible in all aspects of household chores, finances, and medication management, with supervision to ensure adequate performance. She will likely benefit from the establishment and maintenance of a routine in order to maximize her functional abilities over time.  It will be important for Donna Jacobs to have another person with her when in situations where she may need to process information, weigh the pros and cons of different options, and make decisions, in order to ensure that she fully understands and recalls all information to be considered.  If not already done, Donna Jacobs and her family may want to discuss her wishes regarding durable power of attorney and medical decision making, so that she can have input into these choices. Additionally, they may wish to discuss future plans for caretaking and seek out community options for in  home/residential care should they become necessary.  All important information should be presented in a written format whenever possible. This should be placed in a highly visible and commonly frequented location throughout her home to help with recall.  Review of Records:   Donna Jacobs was seen by [facility] Huston Foley, M.D.) on 12/16/2018 for an evaluation of memory loss. She reported memory difficulties ongoing for the past 3 years. Specific difficulties surrounded generalized forgetfulness and misplacing things around her residence. Her friend has been able to observe her driving and feels that she has done well with local and familiar routes. However, Donna Jacobs did report getting lost while driving at times. Some mood issues surrounding frustration were noted; however, no mention of any formal mental health diagnoses in the past. She has a remote history of drug abuse and sought rehabilitation treatment as a teenager. She admitted to prior marijuana and cocaine use. She denied any recent drug use or relapses. She previously had blood work completed. Her B12 level was 257, vitamin D low at 21.9, TSH was normal, and CBC with differential was unremarkable. Performance on a brief cognitive screening instrument (MMSE) was 10/30. Ultimately, Donna Jacobs was referred for a comprehensive neuropsychological evaluation to characterize her cognitive abilities and to assist with diagnostic clarity and treatment planning.   Brain MRI on 01/01/2019 revealed mild nonspecific periventricular T2 hyperintensities.   Past Medical History:  Diagnosis Date  . History of substance abuse    Briefly spent time in substance abuse rehabilitation treatment as a teenager. Cocaine likely represented the primary drug of abuse.   Marland Kitchen Hyperlipidemia    "I used to take med. for cholesteol, but only for a short time"  . Menopausal syndrome   . Rheumatoid arthritis    Lumbar - spondylisthesis  . S/P lumbar spinal  fusion 08/15/2016  . Vitamin D deficiency     Past Surgical History:  Procedure Laterality Date  . ABDOMINAL HYSTERECTOMY    . TUBAL LIGATION    .  VAGINAL DELIVERY     x2    Current Outpatient Medications:  .  acetaminophen (TYLENOL) 325 MG tablet, Take 325 mg by mouth every 6 (six) hours as needed for mild pain or headache., Disp: , Rfl:  .  methocarbamol (ROBAXIN) 500 MG tablet, Take 1 tablet (500 mg total) by mouth every 6 (six) hours as needed for muscle spasms., Disp: 60 tablet, Rfl: 1 .  methotrexate (RHEUMATREX) 2.5 MG tablet, methotrexate sodium 2.5 mg tablet, Disp: , Rfl:  .  oxyCODONE (OXY IR/ROXICODONE) 5 MG immediate release tablet, Take 1-2 tablets (5-10 mg total) by mouth every 6 (six) hours as needed for breakthrough pain., Disp: 30 tablet, Rfl: 0  Clinical Interview:   Cognitive Symptoms: Decreased short-term memory: Endorsed. Donna Jacobs reported generalized symptoms of forgetfulness but could not provide specific examples spontaneously. Upon questioning, she acknowledged trouble remembering the details of previous conversations, misplacing objects around her residence, and occasionally having trouble remembering the names of familiar individuals. She reported that these deficits have been stable and present during the past couple of months. Her friend who attended the current appointment with her clarified that memory deficits have been present for the past three years and seem to have progressively worsened over that time. Her friend also reported Donna Jacobs having trouble remembering upcoming appointments or birthdays, as well as instances where she has gotten lost while driving alone.  Decreased long-term memory: Denied. Decreased attention/concentration: Endorsed. She alluded to attention/concentration abilities representing a longstanding weakness (i.e., "been around for a while"). She also reported increased distractibility. She was unclear if these abilities have worsened over  time.  Reduced processing speed: Unclear.  Difficulties with executive functions: Endorsed. She reported longstanding difficulties with complex planning and organization. Impulsivity was unclear. Donna Jacobs acknowledged that this had been an issue but was unable to provide any examples. When asked about personality changes, her friend commented that Ms. Quain has seemed more withdrawn, quieter, and no longer leads conversations. Ms. Mcinerney agreed with this, stating that she feels less confident given ongoing cognitive concerns.  Difficulties with emotion regulation: Denied. Difficulties with receptive language: Endorsed. Difficulties with word finding: Endorsed. Decreased visuoperceptual ability: Denied.  Difficulties completing ADLs: Endorsed. She reported "not really taking" any medications, suggesting no trouble with medication organization or management. She acknowledged trouble with bill paying, stating that her daughter-in-law has taken this over due to Ms. Martin being unable to perform these actions. Her friend also noted that managing money during simpler transactions at the store has become far more confusing and challenging, where Ms. Merlino often presents far more money than necessary. She continues to drive and denied any accidents or close calls. However, she did report instances of briefly getting lost and confused. Her friend noted that she was able to eventually find her way home without needing to call someone for assistance.   Additional Medical History: History of traumatic brain injury/concussion: Denied. History of stroke: Denied. History of seizure activity: Denied. History of known exposure to toxins: Denied. Symptoms of chronic pain: Endorsed. Ms. Heide originally denied difficulties. However, her friend reminded her of chronic back pain, including prior surgical efforts which did not alleviate pain symptoms. She also then reported a history of foot pain; however, this has diminished  since retiring and no longer being on her feet as much throughout the day.  Experience of frequent headaches/migraines: Denied. Frequent instances of dizziness/vertigo: Denied.  Sensory changes: Denied. Balance/coordination difficulties: Denied. She denied a history of falls.  Other  motor difficulties: Denied.  Sleep History: Estimated hours obtained each night: 8-9 hours. Difficulties falling asleep: Denied. Difficulties staying asleep: Denied. Feels rested and refreshed upon awakening: Endorsed.  History of snoring: Denied. History of waking up gasping for air: Denied. Witnessed breath cessation while asleep: Denied.  History of vivid dreaming: Denied. Excessive movement while asleep: Denied. Instances of acting out her dreams: Denied.  Psychiatric/Behavioral Health History: Depression: Denied. Ms. Rosa described her current mood as "good" and denied prior mental health concerns or diagnoses. Her friend described her mood as "up and down," noting that Ms. Hollenberg's family "gets on her nerves" due to them "questioning everything she does." Ms. Hinderliter then acknowledged symptoms of frustration and occasional agitation surrounding her inability to perform certain actions and these things being taken over by others. Current or remote suicidal ideation, intent, or plan was denied.  Anxiety: Denied. Mania: Denied. Trauma History: Denied. Visual/auditory hallucinations: Denied. Delusional thoughts: Denied.  Tobacco: Denied. Alcohol: She reported rare alcohol consumption currently and denied a history of problematic alcohol abuse or dependence.   Recreational drugs: Denied currently. However, she did acknowledge a history of substance abuse requiring rehabilitation treatment while younger. This was said to occur during her teenage years. She was unclear of all the substances she had tried, noting that she took several "hard drugs." Cocaine was said to represent her primary substance, with  additional marijuana use. She denied any recent substance use or relapses.  Caffeine: She reported consuming 2-3 diet Coke beverages per day.   Family History: Problem Relation Age of Onset  . Alzheimer's disease Paternal Grandmother    This information was confirmed by Ms. Billick.  Academic/Vocational History: Highest level of educational attainment: 12 years. She graduated from high school, describing herself as a good (A/B) student in academic settings. Math was noted as a relative weakness.  History of developmental delay: Denied. History of grade repetition: Denied. Enrollment in special education courses: Denied. History of LD/ADHD: Denied.  Employment: Retired as of March 2021. 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   Evaluation Results:   Behavioral Observations: Ms. Mohamad was accompanied by a close personal friend, arrived to her appointment on time, and was appropriately dressed and groomed. She ambulated with a notable limp. However, this was attributed to a history of leg length discrepancy which she has been previously referred to orthopedics for. Homero Fellers balance instability was not observed and she ambulated with an appropriate pace. Gross motor functioning appeared intact upon informal observation and no abnormal movements (e.g., tremors) were noted. She appeared alert, but was a very limited historian and more specific information had to be provided via her friend throughout the interview. Her affect was generally relaxed, but did range appropriately given the subject being discussed during the clinical interview or the task at hand during testing procedures. She did express some anxiety surrounding the testing portion of the visit. Spontaneous speech was fluent. Subtle word finding difficulties were observed during the clinical interview. These were more pronounced during testing. Thought processes were coherent and normal in content. Insight into her cognitive difficulties  appeared poor as she was a limited historian and could often not provide details surrounding perceived deficits. During testing, sustained attention was appropriate. While task engagement was adequate, she exhibited significant difficulties understanding task instructions, leading to many tasks being discontinued (see table below) She also required instructions to be repeated frequently and often told personal information to the psychometrist several times over a very short period of time, seemingly  unaware that she had previously stated the information. Overall, Ms. Lemmerman was cooperative with the clinical interview and subsequent testing procedures.   Adequacy of Effort: The validity of neuropsychological testing is limited by the extent to which the individual being tested may be assumed to have exerted adequate effort during testing. Ms. Stallone expressed her intention to perform to the best of her abilities and exhibited adequate task engagement and persistence. Scores across stand-alone and embedded performance validity measures were consistently below expectation. However, this is believed to be due to significant ongoing dysfunction rather than poor engagement or attempts to perform poorly. As such, the results of the current evaluation are believed to be a generally valid representation of Ms. Cicalese's current cognitive functioning.  Test Results: Ms. Brillhart was very poorly oriented at the time of the current evaluation. She incorrectly stated her age ("65") and was unable to provide her phone number. Of note, when asked for her phone number, she repeated her address. When the phone number was emphasized, she appeared confused at what was being requested of her and was unable to provide a guess. She was also unable to state the current year, month, date, day of the week, time, current location, or reason for the current evaluation.  Performance on a brief cognitive screening instrument (SLUMS) was 3/30,  consistent with individuals diagnosed with a major neurocognitive disorder.   Intellectual abilities based upon educational and vocational attainment were estimated to be in the average range. Premorbid abilities were estimated to be within the well below average range based upon a single-word reading test.   Processing speed was exceptionally low. Basic attention was well below average. More complex attention (e.g., working memory) was exceptionally low. Executive functioning was exceptionally low.  Assessed receptive language abilities were exceptionally low. Ms. Shidler frequently required instructions to be repeated and exhibited notable difficulties with instruction comprehension. Performance on a semantic knowledge screening test was below expectation. Assessed expressive language (e.g., verbal fluency and confrontation naming) was exceptionally low.     Assessed visuospatial/visuoconstructional abilities were exceptionally low. Her drawing of a clock consisted of a large circle with 4 small circles inside, as well as one hand of the clock pointing to where the 12 would be placed. No numbers were drawn. For her copy of a complex figure, she drew several small shapes (e.g., several circles, triangle, etc.) in an unstructured manner independent of one another.    Learning (i.e., encoding) of novel verbal and visual information was exceptionally low. Spontaneous delayed recall (i.e., retrieval) of previously learned information was also exceptionally low. Retention rates were 0% across a story learning task, 0% across a list learning task, and 0% across a shape learning task. Performance across recognition tasks was very poor, suggesting minimal evidence for information consolidation.   Results of emotional screening instruments suggested that recent symptoms of generalized anxiety were in the minimal range, while symptoms of depression were within normal limits. A screening instrument assessing recent  sleep quality suggested the presence of minimal sleep dysfunction.  Tables of Scores:   Note: This summary of test scores accompanies the interpretive report and should not be considered in isolation without reference to the appropriate sections in the text. Descriptors are based on appropriate normative data and may be adjusted based on clinical judgment. The terms "impaired" and "within normal limits (WNL)" are used when a more specific level of functioning cannot be determined.       Effort Testing:   DESCRIPTOR  Dot Counting Test: --- --- Below Expectation  RBANS Effort Index: --- --- Below Expectation  WAIS-IV Reliable Digit Span: --- --- Below Expectation       Orientation:      Raw Score Percentile   NAB Orientation, Form 1 12/29 --- ---       Cognitive Screening:           Raw Score Percentile   SLUMS: 3/30 --- Impaired       RBANS, Form A: Standard Score/ Scaled Score Percentile   Total Score 44 <1 Exceptionally Low  Immediate Memory 40 <1 Exceptionally Low    List Learning 1 <1 Exceptionally Low    Story Memory 1 <1 Exceptionally Low  Visuospatial/Constructional 50 <1 Exceptionally Low    Figure Copy 1 <1 Exceptionally Low    Line Orientation 0/20 <2 Exceptionally Low  Language 51 <1 Exceptionally Low    Picture Naming 7/10 <2 Exceptionally Low    Semantic Fluency 1 <1 Exceptionally Low  Attention 49 <1 Exceptionally Low    Digit Span 4 2 Well Below Average    Coding 1 <1 Exceptionally Low  Delayed Memory 44 <1 Exceptionally Low    List Recall 0/10 <2 Exceptionally Low    List Recognition 14/20 <2 Exceptionally Low    Story Recall 1 <1 Exceptionally Low    Story Recognition 5/12 1-2 Exceptionally Low    Figure Recall 1 <1 Exceptionally Low    Figure Recognition 1/8 1 Exceptionally Low       Intellectual Functioning:           Standard Score Percentile   Test of Premorbid Functioning: 74 4 Well Below Average        Scaled Score Percentile   WAIS-IV  Information: 2 <1 Exceptionally Low       Attention/Executive Function:          Trail Making Test (TMT): Raw Score (T Score) Percentile     Part A Discontinued --- Impaired    Part B Not attempted --- ---         Scaled Score Percentile   WAIS-IV Coding: Discontinued --- Impaired        Scaled Score Percentile   WAIS-IV Digit Span: 1 <1 Exceptionally Low    Forward 5 5 Well Below Average    Backward 1 <1 Exceptionally Low    Sequencing 1 <1 Exceptionally Low       D-KEFS Color-Word Interference Test: Raw Score (Scaled Score) Percentile     Color Naming 170 secs. (1) <1 Exceptionally Low    Word Reading 136 secs. (1) <1 Exceptionally Low    Inhibition Discontinued --- Impaired    Inhibition/Switching Discontinued --- Impaired       D-KEFS Verbal Fluency Test: Raw Score (Scaled Score) Percentile     Letter Total Correct 0 (1) <1 Exceptionally Low    Category Total Correct 6 (1) <1 Exceptionally Low    Category Switching Total Correct 0 (1) <1 Exceptionally Low    Category Switching Accuracy 0 (1) <1 Exceptionally Low      Total Set Loss Errors 0 (13) 84 Above Average      Total Repetition Errors 2 (11) 63 Average       NAB Executive Functions Module, Form 1: T Score Percentile     Judgment 19 <1 Exceptionally Low       Language:           Raw Score Percentile   PPVT Screening Instrument:  11/16 --- Below Expectation       NAB Language Module, Form 1: T Score Percentile     Auditory Comprehension 19 <1 Exceptionally Low    Naming 15/31 (19) <1 Exceptionally Low       Visuospatial/Visuoconstruction:      Raw Score Percentile   Clock Drawing: 2/10 --- Impaired        Scaled Score Percentile   WAIS-IV Matrix Reasoning: Discontinued --- Impaired  WAIS-IV Visual Puzzles: Discontinued --- Impaired       Mood and Personality:      Raw Score Percentile   Beck Depression Inventory - II: 4 --- Within Normal Limits  PROMIS Anxiety Questionnaire: 7 --- None to Slight        Additional Questionnaires:      Raw Score Percentile   PROMIS Sleep Disturbance Questionnaire: 9 --- None to Slight   Informed Consent and Coding/Compliance:   Ms. Bellemare was provided with a verbal description of the nature and purpose of the present neuropsychological evaluation. Also reviewed were the foreseeable risks and/or discomforts and benefits of the procedure, limits of confidentiality, and mandatory reporting requirements of this provider. The patient was given the opportunity to ask questions and receive answers about the evaluation. Oral consent to participate was provided by the patient.   This evaluation was conducted by Newman Nickels, Ph.D., licensed clinical neuropsychologist. Ms. Wilmot completed a comprehensive clinical interview with Dr. Milbert Coulter, billed as one unit [phone], and 135 minutes of cognitive testing and scoring, billed as one unit [phone] and four additional units [id number]. Psychometrist Wallace Keller, B.S., assisted Dr. Milbert Coulter with test administration and scoring procedures. As a separate and discrete service, Dr. Milbert Coulter spent a total of 180 minutes in interpretation and report writing billed as one unit [phone] and two units [id number].

## 2019-05-28 NOTE — Progress Notes (Signed)
   Psychometrician Note   Cognitive testing was administered to Donna Jacobs by Wallace Keller, B.S. (psychometrist) under the supervision of Dr. Newman Nickels, Ph.D., licensed psychologist on @DATE @. Donna Jacobs did not appear overtly distressed by the testing session per behavioral observation or responses across self-report questionnaires. Dr. Leonor Liv, Ph.D. checked in with Donna Jacobs as needed to manage any distress related to testing procedures (if applicable). Rest breaks were offered.    The battery of tests administered was selected by Dr. Leonor Liv, Ph.D. with consideration to Donna Jacobs's current level of functioning, the nature of her symptoms, emotional and behavioral responses during interview, level of literacy, observed level of motivation/effort, and the nature of the referral question. This battery was communicated to the psychometrist. Communication between Dr. Newman Nickels, Ph.D. and the psychometrist was ongoing throughout the evaluation and Dr. Newman Nickels, Ph.D. was immediately accessible at all times. Dr. Newman Nickels, Ph.D. provided supervision to the psychometrist on the date of this service to the extent necessary to assure the quality of all services provided.    Donna Jacobs will return within approximately 1-2 weeks for an interactive feedback session with Dr. Peterson Jacobs at which time her test performances, clinical impressions, and treatment recommendations will be reviewed in detail. Donna Jacobs understands she can contact our office should she require our assistance before this time.  A total of 135 minutes of billable time were spent face-to-face with Donna Jacobs by the psychometrist. This includes both test administration and scoring time. Billing for these services is reflected in the clinical report generated by Dr. Leonor Liv, Ph.D..  This note reflects time spent with the psychometrician and does not include test scores or any clinical interpretations  made by Dr. Newman Nickels. The full report will follow in a separate note.

## 2019-06-04 ENCOUNTER — Telehealth (INDEPENDENT_AMBULATORY_CARE_PROVIDER_SITE_OTHER): Payer: 59 | Admitting: Psychology

## 2019-06-04 ENCOUNTER — Other Ambulatory Visit: Payer: Self-pay

## 2019-06-04 DIAGNOSIS — F0150 Vascular dementia without behavioral disturbance: Secondary | ICD-10-CM | POA: Diagnosis not present

## 2019-06-04 DIAGNOSIS — F0390 Unspecified dementia without behavioral disturbance: Secondary | ICD-10-CM

## 2019-06-04 NOTE — Progress Notes (Signed)
   Neuropsychology Feedback Session Donna Jacobs. [facility] Department of Neurology  Reason for Referral:   Donna Jacobs a 59 y.o. right-handed Caucasian female referred by Huston Foley, M.D.,to characterize hercurrent cognitive functioning and assist with diagnostic clarity and treatment planning in the context of subjective cognitive decline and positive family history of neurodegenerative illness.   Feedback:   Donna Jacobs completed a comprehensive neuropsychological evaluation on 05/28/2019. Please refer to that encounter for the full report and recommendations. Briefly, results suggested diffuse cognitive impairment with performances consistently scoring in the exceptionally low normative range relative to age-matched peers. Basic attention represented a relative strength; however, scores remained in the well below average normative range and still represent evidence for cognitive decline. Donna Jacobs and her friend reported difficulties completing instrumental activities of daily living (ADLs) surrounding bill paying and financial management, as well as instances where she has gotten lost while driving. This, coupled with evidence for significant cognitive dysfunction described above, suggests that she meets criteria for a Major Neurocognitive Disorder (formerly "dementia") at the present time. Prior neuroimaging in December 2020 was read as only having mild periventricular white matter abnormalities. However, upon my review, cerebral volume loss, particularly surrounding the parietal lobes and posterior areas of the brain, is quite apparent, especially given Donna Jacobs's relatively young age. This, combined with diffuse cognitive impairment evidenced across cognitive testing, is worrisome for the presence of a neurodegenerative illness, particularly an early-onset Alzheimer's disease clinical presentation.  The current feedback session was attended by Donna Jacobs's husband,  sister-in-law, and friend Donna Jacobs. Content of the current session focused on the results of Donna Jacobs's neuropsychological evaluation. Her friends and family were given the opportunity to ask questions and their questions were answered. They were encouraged to reach out should additional questions arise. A copy of Donna Jacobs's report was mailed at the conclusion of the visit.      34 minutes were spent conducting the current feedback session with Donna Jacobs, billed as one unit [phone].

## 2019-06-18 ENCOUNTER — Other Ambulatory Visit: Payer: Self-pay

## 2019-06-18 ENCOUNTER — Encounter: Payer: Self-pay | Admitting: Neurology

## 2019-06-18 ENCOUNTER — Ambulatory Visit (INDEPENDENT_AMBULATORY_CARE_PROVIDER_SITE_OTHER): Payer: 59 | Admitting: Neurology

## 2019-06-18 VITALS — BP 173/95 | HR 75 | Ht 60.0 in | Wt 163.3 lb

## 2019-06-18 DIAGNOSIS — G300 Alzheimer's disease with early onset: Secondary | ICD-10-CM | POA: Diagnosis not present

## 2019-06-18 DIAGNOSIS — R413 Other amnesia: Secondary | ICD-10-CM

## 2019-06-18 DIAGNOSIS — F0280 Dementia in other diseases classified elsewhere without behavioral disturbance: Secondary | ICD-10-CM

## 2019-06-18 MED ORDER — DONEPEZIL HCL 10 MG PO TABS: ORAL_TABLET | ORAL | 3 refills | Status: DC

## 2019-06-18 NOTE — Progress Notes (Addendum)
Subjective:    Patient ID: Donna Jacobs is a 59 y.o. female.  HPI    Interim history:   Donna Jacobs is a 59 year old right-handed woman with an underlying medical history of vitamin D deficiency, hyperlipidemia, arthritis, former smoking and obesity, who presents for follow-up consultation of her memory loss of 3+ years duration.  The patient is accompanied by her daughter-in-law, Donna Jacobs, today.  I first met her on 12/16/2018 at the request of her primary care, at which time the patient reported a history of memory loss, she had been accompanied by her friend at the time.  She had recently had blood work through her primary care and I suggested further work-up in the form of brain MRI and also seeking consultation with neuropsychology.  She had a brain MRI without contrast on 12/30/2018 and I reviewed the results: :    MRI brain (without) demonstrating: - Mild periventricular and subcortical foci of T2 hyperintensities. These findings are non-specific and considerations include autoimmune, inflammatory, post-infectious, microvascular ischemic or migraine associated etiologies.  - No acute findings.  She had consultation, evaluation and feedback appointment with Donna Jacobs, neuropsychologist at [facility] neurology and I reviewed his impression and recommendations.    His evaluation was supportive of early onset Alzheimer's disease.  Further evaluation was recommended in the form of PET scan or CSF evaluation.  Medication recommendations included anticholinergic dementia medication or Namenda.  Today, 06/18/2019: The patient reports very little on her own.  Her history is primarily provided by her daughter-in-law.  They have noticed ongoing issues with short-term memory.  There have been some instances of getting lost while driving.  Patient has retired from her job.  Her workplace was very supportive as I understand.  She lives with her husband, her son lives in [place] and she has another son in  [place].  She does not always hydrate well, she likes to drink diet soda.   The patient's allergies, current medications, family history, past medical history, past social history, past surgical history and problem list were reviewed and updated as appropriate.   Previously:   12/16/18: (She) reports memory loss for the past 3+ years.  The patient has had forgetfulness and often misplaces things.  She has been driving.  Her friend has been able to observe her driving and feels that she has done well with local and familiar routes.  However, patient does report that she has gotten lost driving at times.  She continues to work.  She works for [employer], [employer], and has worked for the same company for over 30 years. She has had some mood issues including frustration, her husband has also been frustrated with her as I understand.  She lives with her husband, she has 2 grown sons.  She has 3 grandsons.  She quit smoking over 20 years ago.  She has a remote history of drug abuse and was in rehab when she was a teenager or young adults.  She admits to trying cocaine but denies any IV drug use, she denies any recent drug use or relapse, she has smoked marijuana as well.  She does not hydrate very well with water she admits.  She likes to drink Diet Coke.  She is not sure how many cans she drinks per day.  She does not currently drink any alcohol.  She denies any heavy alcohol use or alcohol abuse in the past.  She does not snore.  Donna Jacobs has not noticed any gasping sounds or snoring  sounds when they have shared a room before on a trip. She reports that her paternal grandmother had Alzheimer's disease.  Paternal aunt had no dementia.  The patient is the oldest of 32, she has a brother and a sister, neither 1 have memory loss.  I reviewed your office note from 10/27/2018.  She was referred to rheumatology at the time for a presumed diagnosis of rheumatoid arthritis.  She was also referred to orthopedics for leg  length discrepancy.  She had blood work at the time including CBC with differential, B12, TSH, vitamin D.  Her B12 level was 257, vitamin D Jacobs at 21.9, TSH was normal, CBC with differential was unremarkable.  She is followed by rheumatology for her arthritis and prior diagnosis of rheumatoid arthritis.  Her Past Medical History Is Significant For: Past Medical History:  Diagnosis Date  . History of substance abuse    Briefly spent time in substance abuse rehabilitation treatment as a teenager. Cocaine likely represented the primary drug of abuse.   Marland Kitchen Hyperlipidemia    "I used to take med. for cholesteol, but only for a short time"  . Major neurocognitive disorder 05/28/2019  . Menopausal syndrome   . Rheumatoid arthritis    Lumbar - spondylisthesis  . S/P lumbar spinal fusion 08/15/2016  . Vitamin D deficiency     Her Past Surgical History Is Significant For: Past Surgical History:  Procedure Laterality Date  . ABDOMINAL HYSTERECTOMY    . TUBAL LIGATION    . VAGINAL DELIVERY     x2    Her Family History Is Significant For: Family History  Problem Relation Age of Onset  . Alzheimer's disease Paternal Grandmother     Her Social History Is Significant For: Social History   Socioeconomic History  . Marital status: Married    Spouse name: Not on file  . Number of children: Not on file  . Years of education: 26  . Highest education level: High school graduate  Occupational History  . Occupation: Retired    Comment: Designer, multimedia at [employer]  . Smoking status: Former Smoker    Types: Cigarettes    Quit date: 08/10/1975    Years since quitting: 43.8  . Smokeless tobacco: Never Used  Substance and Sexual Activity  . Alcohol use: Yes    Comment: very rarely  . Drug use: Not Currently    Types: Cocaine, Marijuana  . Sexual activity: Not on file  Other Topics Concern  . Not on file  Social History Narrative  . Not on file   Social Determinants of Health    Financial Resource Strain:   . Difficulty of Paying Living Expenses:   Food Insecurity:   . Worried About Charity fundraiser in the Last Year:   . Arboriculturist in the Last Year:   Transportation Needs:   . Film/video editor (Medical):   Marland Kitchen Lack of Transportation (Non-Medical):   Physical Activity:   . Days of Exercise per Week:   . Minutes of Exercise per Session:   Stress:   . Feeling of Stress :   Social Connections:   . Frequency of Communication with Friends and Family:   . Frequency of Social Gatherings with Friends and Family:   . Attends Religious Services:   . Active Member of Clubs or Organizations:   . Attends Archivist Meetings:   Marland Kitchen Marital Status:     Her Allergies Are:  Allergies  Allergen Reactions  . Lipitor [Atorvastatin Calcium]   . Codeine Nausea And Vomiting  :   Her Current Medications Are:  Outpatient Encounter Medications as of 06/18/2019  Medication Sig  . donepezil (ARICEPT) 10 MG tablet 1/2 each bedtime for 30 days, then 1 pill each night thereafter  . [DISCONTINUED] acetaminophen (TYLENOL) 325 MG tablet Take 325 mg by mouth every 6 (six) hours as needed for mild pain or headache.  . [DISCONTINUED] methocarbamol (ROBAXIN) 500 MG tablet Take 1 tablet (500 mg total) by mouth every 6 (six) hours as needed for muscle spasms.  . [DISCONTINUED] methotrexate (RHEUMATREX) 2.5 MG tablet methotrexate sodium 2.5 mg tablet  . [DISCONTINUED] oxyCODONE (OXY IR/ROXICODONE) 5 MG immediate release tablet Take 1-2 tablets (5-10 mg total) by mouth every 6 (six) hours as needed for breakthrough pain.   No facility-administered encounter medications on file as of 06/18/2019.  :  Review of Systems:  Out of a complete 14 point review of systems, all are reviewed and negative with the exception of these symptoms as listed below:  Review of Systems  Neurological:       Pt here for f/u on memory loss. Neuro psych eval completed with Donna Jacobs on  06/04/2019. Daughter -in-law reports the pt has not improved since her last visit and is not taking any prescription medication.    Objective:  Neurological Exam  Physical Exam Physical Examination:   Vitals:   06/18/19 0818  BP: (!) 173/95  Pulse: 75   General Examination: The patient is a very pleasant 58 y.o. female in no acute distress. She appears well-developed and well-nourished and well groomed.   HEENT: Normocephalic, atraumatic, pupils are equal, round and reactive to light. Extraocular tracking is good without limitation to gaze excursion or nystagmus noted. Normal smooth pursuit is noted. Hearing is grossly intact. Face is symmetric with normal facial animation and normal facial sensation. Speech is clear with no dysarthria noted. There is no hypophonia. There is no lip, neck/head, jaw or voice tremor. Neck is supple with full range of passive and active motion. There are no carotid bruits on auscultation. Oropharynx exam reveals: mild to moderate mouth dryness, adequate dental hygiene. Tongue protrudes centrally and palate elevates symmetrically.   Chest: Clear to auscultation without wheezing, rhonchi or crackles noted.  Heart: S1+S2+0, regular and normal without murmurs, rubs or gallops noted.   Abdomen: Soft, non-tender and non-distended with normal bowel sounds appreciated on auscultation.  Extremities: There is no pitting edema in the distal lower extremities bilaterally. Pedal pulses are intact.  Skin: Warm and dry without trophic changes noted.  Musculoskeletal: exam reveals no obvious joint deformities, tenderness or joint swelling or erythema.   Neurologically:  Mental status: The patient is awake, alert and oriented in all 4 spheres. Her immediate and remote memory, attention, language skills and fund of knowledge are Impaired, she is unable to give details on her history and Donna Jacobs provides most of the Hx. There is no evidence of aphasia, agnosia, apraxia  or anomia. Speech is clear with normal prosody and enunciation. Thought process is linear. Mood is normal and affect is constricted.   On 12/16/2018: MMSE: 10/30, CDT: 0/4, AFT: 4/min.   Cranial nerves II - XII are as described above under HEENT exam. In addition: shoulder shrug is normal with equal shoulder height noted. Motor exam: Normal bulk, strength and tone is noted. There is no drift, tremor or rebound. Fine motor skills and coordination: grossly intact.  Cerebellar testing: No dysmetria or  intention tremor. There is no truncal or gait ataxia.  Sensory exam: intact to light touch in the upper and lower extremities.  Gait, station and balance: She stands up with minimal difficulty, posture is stooped forward, she has increased in lumbar kyphosis. Unchanged gait.   Assessment and Plan:  In summary, Donna Jacobs is a very pleasant 59 year old female with an underlying medical history of vitamin D deficiency, hyperlipidemia, arthritis, former smoking and obesity, who Presents for follow-up consultation of her memory loss of 3+ years duration.  Symptoms have been progressive.  Recent brain MRI was reasonably benign appearing.  Neuropsychological evaluation was abnormal and would support Alzheimer's type dementia.  She had blood work through her primary care in the recent past.  She was advised about her recent test results and we had a long discussion today about dementia, its prognosis and treatment options.  I suggested evaluation with a amyloid brain PET scan.  She is agreeable to proceeding.  We mutually agreed not to do any invasive testing with lumbar puncture for now, she is not in favor of pursuing this at this point which is understandable.  She is agreeable to starting medication in the form of generic Aricept 10 mg strength half a pill once daily in the evening.  We talked about potential side effects and expectations with this medication.  After about 30 days she is advised to increase  it to 1 pill each evening.  She is advised to stay well-hydrated, well rested, stay mentally and physically active.  She is advised to no longer drive.  They are encouraged to talk about power of attorney and seek the help of an attorney for this.  She may not be hydrating well enough with water, likes diet Dr. [name].  We talked about the importance of a healthy lifestyle.  Thankfully, she has a good support system.  She is advised to follow-up in 3 months, sooner if needed.  Of note, she is currently no longer on any medication for her rheumatoid arthritis.  They are encouraged to make a follow-up appointment with the rheumatologist.  I answered all the questions today and the patient and her daughter-in-law were in agreement.   I spent 40 minutes in total face-to-face time and in reviewing records during pre-charting, more than 50% of which was spent in counseling and coordination of care, reviewing test results, reviewing medications and treatment regimen and/or in discussing or reviewing the diagnosis of dementia, the prognosis and treatment options. Pertinent laboratory and imaging test results that were available during this visit with the patient were reviewed by me and considered in my medical decision making (see chart for details).   I will change the PET scan request to metabolic PET, as the amyloid PET may not be covered by ins.

## 2019-06-18 NOTE — Patient Instructions (Signed)
It was good to see you again today.  It was nice to meet your daughter-in-law, Winter.  As we discussed, your history and findings are supportive of Alzheimer's disease. We will start you on medication: Aricept (generic name: donepezil) 10 mg: take 1/2 pill each evening for the first month and then you can increase to 1 pill each evening thereafter.   Common side effects may include dry eyes, dry mouth, confusion, low pulse, low blood pressure, also GI related side effects (nausea, vomiting, diarrhea, constipation), headaches; rare side effects may include hallucinations and seizures.   We may consider a second medication down the road.  As discussed, please work on getting your power of attorney set up with the help of a lawyer and with your family.  I would recommend that you no longer drive.  Please continue to pursue a healthy lifestyle, good nutrition, good hydration with water, 6 to 8 cups/day are recommended, 8 ounce each.  Please try to get enough rest, stay active mentally and physically.  Follow-up in 3 months.

## 2019-06-25 NOTE — Addendum Note (Signed)
Addended by: Huston Foley on: 06/25/2019 04:03 PM   Modules accepted: Orders

## 2019-07-02 ENCOUNTER — Telehealth: Payer: Self-pay | Admitting: Neurology

## 2019-07-02 NOTE — Telephone Encounter (Signed)
Pet scan has been faxed for approval to [facility] clinic notes Telephone [phone]- fax [phone] case # [id number] cpt [phone]

## 2019-07-06 ENCOUNTER — Telehealth: Payer: Self-pay | Admitting: *Deleted

## 2019-07-06 NOTE — Telephone Encounter (Signed)
[name] (on dpr) called wanting to know status of scheduling Donna Jacobs PET scan. Advised it looked like insurance auth pending approval but I will have [name] Call her with an update. She verbalized understanding.

## 2019-07-06 NOTE — Telephone Encounter (Signed)
Called and spoke to [place] and patient is scheduled .

## 2019-07-07 ENCOUNTER — Telehealth: Payer: Self-pay | Admitting: Neurology

## 2019-07-07 NOTE — Telephone Encounter (Signed)
[place] called wanting to know if the pt can try the new Alzheimer's medication that has come out recently. Please advise.

## 2019-07-08 NOTE — Telephone Encounter (Signed)
I reached out to Donna Jacobs and advised that Dr. Frances Furbish is currently on vacation and this question will be addressed with her once she returns. Christ was agreeable.

## 2019-07-14 ENCOUNTER — Other Ambulatory Visit: Payer: Self-pay

## 2019-07-14 ENCOUNTER — Encounter (HOSPITAL_COMMUNITY)
Admission: RE | Admit: 2019-07-14 | Discharge: 2019-07-14 | Disposition: A | Discharge status: Home / Self Care | Payer: 59 | Source: Ambulatory Visit | Attending: Neurology | Admitting: Neurology

## 2019-07-14 DIAGNOSIS — R413 Other amnesia: Secondary | ICD-10-CM | POA: Insufficient documentation

## 2019-07-14 DIAGNOSIS — F0280 Dementia in other diseases classified elsewhere without behavioral disturbance: Secondary | ICD-10-CM | POA: Diagnosis present

## 2019-07-14 DIAGNOSIS — G300 Alzheimer's disease with early onset: Secondary | ICD-10-CM | POA: Diagnosis present

## 2019-07-14 LAB — GLUCOSE, CAPILLARY: Glucose-Capillary: 92 mg/dL (ref 70–99)

## 2019-07-14 MED ORDER — FLUDEOXYGLUCOSE F - 18 (FDG) INJECTION: 9.5000 | Freq: Once | INTRAVENOUS | Status: DC | PRN

## 2019-07-16 NOTE — Progress Notes (Signed)
Please call patient or family member on DPR that the recent brain PET scan was supportive of the diagnosis of Alzheimers, like we discussed and suspected. We will continue course as planned. As for the new AD drug, we have to follow the FDA recommendations and specified criteria. We have not used the drug yet, but are hoping to be able to start offering it in the next few weeks to patients who qualify. I don't know if she would be a candidate for it. I will definitely keep her in mind.

## 2019-07-21 ENCOUNTER — Telehealth: Payer: Self-pay | Admitting: *Deleted

## 2019-07-21 NOTE — Telephone Encounter (Signed)
-----   Message from Huston Foley, MD sent at 07/16/2019 11:06 AM EDT ----- Please call patient or family member on DPR that the recent brain PET scan was supportive of the diagnosis of Alzheimers, like we discussed and suspected. We will continue course as planned. As for the new AD drug, we have to follow the FDA recommendations and specified criteria. We have not used the drug yet, but are hoping to be able to start offering it in the next few weeks to patients who qualify. I don't know if she would be a candidate for it. I will definitely keep her in mind.

## 2019-07-21 NOTE — Telephone Encounter (Signed)
I called pt's daughter in law Winter Brummit (on [place]) and LVM asking for call back. Left office number and hours in message.

## 2019-07-22 ENCOUNTER — Other Ambulatory Visit (HOSPITAL_COMMUNITY): Payer: Self-pay | Admitting: Neurology

## 2019-07-27 NOTE — Telephone Encounter (Signed)
Pt's mom returned my call and we discussed pet scan. She verbalized understanding and had no other questions at this time.

## 2019-07-27 NOTE — Telephone Encounter (Signed)
Patient mom called you back.

## 2019-07-27 NOTE — Telephone Encounter (Signed)
I called pt. No answer, left a message asking pt to call me back.  I requested mom to call me back.

## 2019-08-24 ENCOUNTER — Other Ambulatory Visit: Payer: Self-pay | Admitting: Neurology

## 2019-09-02 DIAGNOSIS — Z0289 Encounter for other administrative examinations: Secondary | ICD-10-CM

## 2019-09-07 DIAGNOSIS — Z0271 Encounter for disability determination: Secondary | ICD-10-CM

## 2019-09-08 ENCOUNTER — Telehealth: Payer: Self-pay | Admitting: Neurology

## 2019-09-08 NOTE — Telephone Encounter (Signed)
Patients care giver called about PET scan  Bill she is going to call [employer] to see if she can set up payment plan.

## 2019-09-15 ENCOUNTER — Telehealth: Payer: Self-pay | Admitting: Neurology

## 2019-09-15 MED ORDER — DONEPEZIL HCL 10 MG PO TABS: ORAL_TABLET | ORAL | 1 refills | Status: DC

## 2019-09-15 NOTE — Telephone Encounter (Signed)
Pt's daughter in law called stating that the insurance will not cover the pt's donepezil (ARICEPT) 10 MG tablet unless it is called in for a 90 day supply and it is called in to the [facility] in [place] [phone] Please send prescription to the above pharmacy from now on. Pt only has one pill left for tonight's dose.

## 2019-09-15 NOTE — Telephone Encounter (Signed)
90 day rx has been called into the [facility] in [place] per pt's request.

## 2019-09-21 ENCOUNTER — Encounter: Payer: Self-pay | Admitting: Neurology

## 2019-09-21 ENCOUNTER — Ambulatory Visit (INDEPENDENT_AMBULATORY_CARE_PROVIDER_SITE_OTHER): Payer: 59 | Admitting: Neurology

## 2019-09-21 VITALS — BP 146/76 | HR 76 | Ht 65.0 in | Wt 169.0 lb

## 2019-09-21 DIAGNOSIS — G300 Alzheimer's disease with early onset: Secondary | ICD-10-CM

## 2019-09-21 DIAGNOSIS — F0280 Dementia in other diseases classified elsewhere without behavioral disturbance: Secondary | ICD-10-CM | POA: Diagnosis not present

## 2019-09-21 NOTE — Patient Instructions (Signed)
I am glad to hear that you have eventually been able to tolerate the donepezil 10 mg daily.  Let us continue with this medication.  Please follow-up routinely in 4 months to see one of our nurse practitioners, I am hoping that we can consider adding a second medication for memory at the time.  Please continue to hydrate well with water, 6 to 8 cups of water per day are recommended, generally speaking.  Please try to continue to pursue physical activity, particularly in the form of walking if you can, weather permitting.  You can also look into different creative activities such as using modeling clay, coloring books, doing crochet or knitting.  Looking at magazines and reading can also help challenge your brain in different ways.

## 2019-09-21 NOTE — Progress Notes (Signed)
Subjective:    Patient ID: Donna Jacobs is a 59 y.o. female.  HPI     Interim history:  Donna Jacobs is a 59 year old right-handed woman with an underlying medical history of vitamin D deficiency, hyperlipidemia, arthritis, former smoking and obesity, who presents for follow-up consultation of her memory loss of 3+ years duration.  The patient is accompanied by her daughter-in-law, Donna Jacobs, today.  I last saw her on 06/18/2019, at which time we talked about her recent test results including her MRI and neuropsychological evaluation.  She was agreeable to pursuing a PET scan.  She was advised to start Aricept 10 mg strength half a pill with increase to 1 pill after about a month.  She had an interim brain metabolic PET scan on 9/62/2297 and I reviewed the results: IMPRESSION: 1. Symmetric decreased radiotracer activity within bilateral parietal lobes compatible with a pattern seen with Alzheimer's dementia.  Her mother was contacted with her test results.  Today, 09/21/2019: She reports doing fairly well.  Sometimes she feels her donepezil was making her tired and her eyelids become heavy.  She has a routine eye examination pending for later this year in November.  She has not fallen asleep inadvertently during the day.  In the first 2 weeks after starting the full dose of donepezil she was notably irritable per daughter-in-law.  She had some anger type reactions that were not like her.  It did help to take a break and go to the beach with the family.  She did well with that.  She has had some intermittent diarrhea but has a history of having diarrhea in the past, she saw GI in the past, has not seen a GI specialist as of late.  All in all, she is able to tolerate the donepezil and she has not had any mood irritability lately.  She is interested in watching TV.  She does not do a whole lot in terms of other activities.  She has not been able to exercise on a regular basis, walking outside has been possible  intermittently weather permitting.  She has increased her water intake and decreased her soda intake.  She has not fallen recently.  She has seen rheumatology and had significant amount of blood work per daughter-in-law and was told to start vitamin D.  She has been using a gummy type multivitamin, no separate vitamin D.  They are not sure how much vitamin D is in it.    The patient's allergies, current medications, family history, past medical history, past social history, past surgical history and problem list were reviewed and updated as appropriate.    Previously:    I first met her on 12/16/2018 at the request of her primary care, at which time the patient reported a history of memory loss, she had been accompanied by her friend at the time.  She had recently had blood work through her primary care and I suggested further work-up in the form of brain MRI and also seeking consultation with neuropsychology.   She had a brain MRI without contrast on 12/30/2018 and I reviewed the results: :    MRI brain (without) demonstrating: - Mild periventricular and subcortical foci of T2 hyperintensities. These findings are non-specific and considerations include autoimmune, inflammatory, post-infectious, microvascular ischemic or migraine associated etiologies.  - No acute findings.   She had consultation, evaluation and feedback appointment with Dr. Melvyn Novas, neuropsychologist at [facility] neurology and I reviewed his impression and recommendations.  His evaluation was supportive of early onset Alzheimer's disease.  Further evaluation was recommended in the form of PET scan or CSF evaluation.  Medication recommendations included anticholinergic dementia medication or Namenda.     12/16/18: (She) reports memory loss for the past 3+ years.  The patient has had forgetfulness and often misplaces things.  She has been driving.  Her friend has been able to observe her driving and feels that she has done well  with local and familiar routes.  However, patient does report that she has gotten lost driving at times.  She continues to work.  She works for [employer], [employer], and has worked for the same company for over 30 years. She has had some mood issues including frustration, her husband has also been frustrated with her as I understand.  She lives with her husband, she has 2 grown sons.  She has 3 grandsons.  She quit smoking over 20 years ago.  She has a remote history of drug abuse and was in rehab when she was a teenager or young adults.  She admits to trying cocaine but denies any IV drug use, she denies any recent drug use or relapse, she has smoked marijuana as well.  She does not hydrate very well with water she admits.  She likes to drink Diet Coke.  She is not sure how many cans she drinks per day.  She does not currently drink any alcohol.  She denies any heavy alcohol use or alcohol abuse in the past.  She does not snore.  Donna Jacobs has not noticed any gasping sounds or snoring sounds when they have shared a room before on a trip. She reports that her paternal grandmother had Alzheimer's disease.  Paternal aunt had no dementia.  The patient is the oldest of 15, she has a brother and a sister, neither 1 have memory loss.  I reviewed your office note from 10/27/2018.  She was referred to rheumatology at the time for a presumed diagnosis of rheumatoid arthritis.  She was also referred to orthopedics for leg length discrepancy.  She had blood work at the time including CBC with differential, B12, TSH, vitamin D.  Her B12 level was 257, vitamin D Jacobs at 21.9, TSH was normal, CBC with differential was unremarkable.  She is followed by rheumatology for her arthritis and prior diagnosis of rheumatoid arthritis. Her Past Medical History Is Significant For: Past Medical History:  Diagnosis Date  . History of substance abuse    Briefly spent time in substance abuse rehabilitation treatment as a teenager. Cocaine  likely represented the primary drug of abuse.   Marland Kitchen Hyperlipidemia    "I used to take med. for cholesteol, but only for a short time"  . Major neurocognitive disorder 05/28/2019  . Menopausal syndrome   . Rheumatoid arthritis    Lumbar - spondylisthesis  . S/P lumbar spinal fusion 08/15/2016  . Vitamin D deficiency     Her Past Surgical History Is Significant For: Past Surgical History:  Procedure Laterality Date  . ABDOMINAL HYSTERECTOMY    . TUBAL LIGATION    . VAGINAL DELIVERY     x2    Her Family History Is Significant For: Family History  Problem Relation Age of Onset  . Alzheimer's disease Paternal Grandmother     Her Social History Is Significant For: Social History   Socioeconomic History  . Marital status: Married    Spouse name: Not on file  . Number of children: Not on  file  . Years of education: 35  . Highest education level: High school graduate  Occupational History  . 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[employer]  . Smoking status: Former Smoker    Types: Cigarettes    Quit date: 08/10/1975    Years since quitting: 44.1  . Smokeless tobacco: Never Used  Substance and Sexual Activity  . Alcohol use: Yes    Comment: very rarely  . Drug use: Not Currently    Types: Cocaine, Marijuana  . Sexual activity: Not on file  Other Topics Concern  . Not on file  Social History Narrative  . Not on file   Social Determinants of Health   Financial Resource Strain:   . Difficulty of Paying Living Expenses: Not on file  Food Insecurity:   . Worried About Programme researcher, broadcasting/film/video in the Last Year: Not on file  . Ran Out of Food in the Last Year: Not on file  Transportation Needs:   . Lack of Transportation (Medical): Not on file  . Lack of Transportation (Non-Medical): Not on file  Physical Activity:   . Days of Exercise per Week: Not on file  . Minutes of Exercise per Session: Not on file  Stress:   . Feeling of Stress : Not on file   Social Connections:   . Frequency of Communication with Friends and Family: Not on file  . Frequency of Social Gatherings with Friends and Family: Not on file  . Attends Religious Services: Not on file  . Active Member of Clubs or Organizations: Not on file  . Attends Banker Meetings: Not on file  . Marital Status: Not on file    Her Allergies Are:  Allergies  Allergen Reactions  . Lipitor [Atorvastatin Calcium]   . Codeine Nausea And Vomiting  :   Her Current Medications Are:  Outpatient Encounter Medications as of 09/21/2019  Medication Sig  . donepezil (ARICEPT) 10 MG tablet TAKE 1 TABLET AT BEDTIME   No facility-administered encounter medications on file as of 09/21/2019.  :  Review of Systems:  Out of a complete 14 point review of systems, all are reviewed and negative with the exception of these symptoms as listed below: Review of Systems  Neurological:       Here for 3 month f/u on memory loss. Reports she has been more drowsy since her last visit- feels like this could be related to the Aricept.     Objective:  Neurological Exam  Physical Exam Physical Examination:   Vitals:   09/21/19 0836  BP: (!) 146/76  Pulse: 76  SpO2: 99%   General Examination: The patient is a very pleasant 59 y.o. female in no acute distress. She appears well-developed and well-nourished and well groomed.   HEENT:Normocephalic, atraumatic, pupils are equal, round and reactive to light. Extraocular tracking is good without limitation to gaze excursion or nystagmus noted. Normal smooth pursuit is noted. Hearing is grossly intact. Face is symmetric with normal facial animation and normal facial sensation. Speech is clear with no dysarthria noted. There is no hypophonia. There is no lip, neck/head, jaw or voice tremor. Neck is supple with full range of passive and active motion. Oropharynx exam reveals: mild to moderatemouth dryness, adequatedental hygiene. Tongue protrudes  centrally and palate elevates symmetrically.   Chest:Clear to auscultation without wheezing, rhonchi or crackles noted.  Heart:S1+S2+0, regular and normal without murmurs, rubs or gallops noted.   Abdomen:Soft, non-tender and  non-distended.  Extremities:There isnopitting edema in the distal lower extremities bilaterally.   Skin: Warm and dry without trophic changes noted.  Musculoskeletal: exam reveals no obvious joint deformities, tenderness or joint swelling or erythema.   Neurologically:  Mental status: The patient is awake, alert and pays good attention, she is oriented to circumstance, clinic, city, self.  Immediate and remote memory, attention, language skills and fund of knowledge are Impaired, she is unable to give details on her history and Donna Jacobs provides most of theHx.There is no evidence of aphasia, agnosia, apraxia or anomia. Speech is clear with normal prosody and enunciation. Thought process is linear. Mood isnormaland affect is good.   On11/17/2020: MMSE: 10/30, CDT: 0/4, AFT: 4/min.  On 09/21/2019: MMSE: 11/30, CDT: 1/4, AFT: 2/min.  Cranial nerves II - XII are as described above under HEENT exam. In addition: shoulder shrug is normal with equal shoulder height noted. Motor exam: Normal bulk, strength and tone is noted. There is no tremor. Fine motor skills and coordination: grossly intact.  Cerebellar testing: No dysmetria or intention tremor. There is no truncal or gait ataxia.  Sensory exam: intact to light touch in the upper and lower extremities.  Gait, station and balance:She stands up with minimal difficulty, posture is stooped forward, she has increased in lumbar kyphosis. Unchanged gait.   Assessmentand Plan:  In summary,Sae R Holtis a very pleasant 72 year oldfemalewith an underlying medical history of vitamin D deficiency, hyperlipidemia, arthritis, former smoking and obesity, whoPresents for follow-up consultation of her memory  loss of 3 to 3 1/2+ years duration.  Symptoms have been progressive.  Recent brain MRI was reasonably benign appearing.  Metabolic brain PET scan was supportive of Alzheimer's type changes and neuropsychological evaluation was also supportive of early onset Alzheimer's disease.  She has been on Aricept.  She had initial difficulty tolerating it but has been able to tolerate this as of late.  She had some personality type changes including irritability in the first couple of weeks of increasing it to the full dose but this has evened out.  She has had some intermittent diarrhea but has a longer standing history of GI problems, has not seen GI in the recent past.  She had blood work through her primary care physician.  She also had additional blood work through rheumatology and was told to start over-the-counter vitamin D.  She is currently taking a multivitamin.  They are advised to look at home as to the content of vitamin D, I would recommend generally speaking 1000 to 2000 units daily should be good.  She can get her vitamin D level rechecked by her primary care physician.  She is advised to stay well-hydrated, well rested, stay mentally and physically active.  She has been advised to no longer drive.   She is advised to continue with donepezil.  She is advised to follow-up in 4 months to see one of our nurse practitioners and we will consider adding a secondary drug.  I answered all their questions today and the patient and her daughter-in-law were in agreement with the plan.   I spent 30 minutes in total face-to-face time and in reviewing records during pre-charting, more than 50% of which was spent in counseling and coordination of care, reviewing test results, reviewing medications and treatment regimen and/or in discussing or reviewing the diagnosis of AD, the prognosis and treatment options. Pertinent laboratory and imaging test results that were available during this visit with the patient were reviewed  by me  and considered in my medical decision making (see chart for details).

## 2020-01-25 ENCOUNTER — Ambulatory Visit: Payer: 59 | Admitting: Family Medicine

## 2020-01-26 ENCOUNTER — Encounter: Payer: Self-pay | Admitting: Family Medicine

## 2020-01-26 ENCOUNTER — Ambulatory Visit: Payer: BLUE CROSS/BLUE SHIELD | Admitting: Family Medicine

## 2020-01-26 ENCOUNTER — Other Ambulatory Visit: Payer: Self-pay

## 2020-01-26 VITALS — BP 170/80 | HR 70 | Ht 65.0 in | Wt 180.0 lb

## 2020-01-26 DIAGNOSIS — F0280 Dementia in other diseases classified elsewhere without behavioral disturbance: Secondary | ICD-10-CM | POA: Diagnosis not present

## 2020-01-26 DIAGNOSIS — G300 Alzheimer's disease with early onset: Secondary | ICD-10-CM | POA: Diagnosis not present

## 2020-01-26 MED ORDER — MEMANTINE HCL 5 MG PO TABS: 5.0000 mg | ORAL_TABLET | Freq: Two times a day (BID) | ORAL | 1 refills | Status: DC

## 2020-01-26 NOTE — Progress Notes (Addendum)
Chief Complaint  Patient presents with  . Follow-up    Rm 3, with daughter in law, reports mood issues     HISTORY OF PRESENT ILLNESS: Today 01/26/20  Donna Jacobs is a 59 y.o. female here today for follow up for early onset AD. Overall, she seems to be about the same since last being seen. Her daughter in law presents with her today and aids in history. She reports that around September, she noticed that she seemed more irritable and depressed. No previous history of depression. No concerns of SI/HI. She was a very active person when healthy. Now she is not as active and gets very frustrated when she can't go out, shop or visit with friends. She does not seem to understand why she is not able to go out.  She lives with her husband. She does not drive. Donna Jacobs gives medications and prepares all foods. Donna Jacobs is able to perform most other ADL's independently.    HISTORY (copied from previous note)  Donna Jacobs is a 59 year old right-handed woman with an underlying medical history of vitamin D deficiency, hyperlipidemia, arthritis, former smoking and obesity, whopresents for follow-up consultation of her memory loss of 3+ years duration. The patient is accompanied by her daughter-in-law, Donna Jacobs, today. I last saw her on 06/18/2019, at which time we talked about her recent test results including her MRI and neuropsychological evaluation.  She was agreeable to pursuing a PET scan.  She was advised to start Aricept 10 mg strength half a pill with increase to 1 pill after about a month.  She had an interim brain metabolic PET scan on 7/84/6962 and I reviewed the results: IMPRESSION: 1. Symmetric decreased radiotracer activity within bilateral parietal lobes compatible with a pattern seen with Alzheimer's dementia.  Her mother was contacted with her test results.  Today, 09/21/2019: She reports doing fairly well.  Sometimes she feels her donepezil was making her tired and her eyelids become  heavy.  She has a routine eye examination pending for later this year in November.  She has not fallen asleep inadvertently during the day.  In the first 2 weeks after starting the full dose of donepezil she was notably irritable per daughter-in-law.  She had some anger type reactions that were not like her.  It did help to take a break and go to the beach with the family.  She did well with that.  She has had some intermittent diarrhea but has a history of having diarrhea in the past, she saw GI in the past, has not seen a GI specialist as of late.  All in all, she is able to tolerate the donepezil and she has not had any mood irritability lately.  She is interested in watching TV.  She does not do a whole lot in terms of other activities.  She has not been able to exercise on a regular basis, walking outside has been possible intermittently weather permitting.  She has increased her water intake and decreased her soda intake.  She has not fallen recently.  She has seen rheumatology and had significant amount of blood work per daughter-in-law and was told to start vitamin D.  She has been using a gummy type multivitamin, no separate vitamin D.  They are not sure Jacobs much vitamin D is in it.    The patient's allergies, current medications, family history, past medical history, past social history, past surgical history and problem list were reviewed and updated as  appropriate.  Previously:    I first met her on 12/16/2018 at the request of her primary care, at which time the patient reported a history of memory loss, she had been accompanied by her friend at the time. She had recently had blood work through her primary care and I suggested further work-up in the form of brain MRI and also seeking consultation with neuropsychology.  She had a brain MRI without contrast on 12/30/2018 and I reviewed the results: :   MRI brain (without) demonstrating: - Mild periventricular and subcortical foci of T2  hyperintensities. These findings are non-specific and considerations include autoimmune, inflammatory, post-infectious, microvascular ischemic or migraine associated etiologies.  - No acute findings.  She had consultation, evaluation and feedback appointment with Dr. Melvyn Novas, neuropsychologist at [facility] neurology and I reviewed his impression and recommendations.    His evaluation was supportive of early onset Alzheimer's disease. Further evaluation was recommended in the form of PET scan or CSF evaluation. Medication recommendations included anticholinergic dementia medication or Namenda.   12/16/18: (She) reports memory loss for the past 3+ years. The patient has had forgetfulness and often misplaces things. She has been driving. Her friend has been able to observe her driving and feels that she has done well with local and familiar routes. However, patient does report that she has gotten lost driving at times. She continues to work. She works for [employer], [employer], and has worked for the same company for over 30 years. She has had some mood issues including frustration, her husband has also been frustrated with her as I understand. She lives with her husband, she has 2 grown sons. She has 3 grandsons. She quit smoking over 20 years ago. She has a remote history of drug abuse and was in rehab when she was a teenager or young adults. She admits to trying cocaine but denies any IV drug use, she denies any recent drug use or relapse, she has smoked marijuana as well. She does not hydrate very well with water she admits. She likes to drink Diet Coke. She is not sure Jacobs many cans she drinks per day. She does not currently drink any alcohol. She denies any heavy alcohol use or alcohol abuse in the past. She does not snore. Donna Jacobs has not noticed any gasping sounds or snoring sounds when they have shared a room before on a trip.She reports that her paternal grandmother had  Alzheimer's disease. Paternal aunt had no dementia. The patient is the oldest of 2, she has a brother and a sister, neither 1 have memory loss. I reviewed your office note from 10/27/2018. She was referred to rheumatology at the time for a presumed diagnosis of rheumatoid arthritis. She was also referred to orthopedics for leg length discrepancy. She had blood work at the time including CBC with differential, B12, TSH, vitamin D. Her B12 level was 257, vitamin D Jacobs at 21.9, TSH was normal, CBC with differential was unremarkable.  She is followed by rheumatology for her arthritis and prior diagnosis of rheumatoid arthritis.   REVIEW OF SYSTEMS: Out of a complete 14 system review of symptoms, the patient complains only of the following symptoms, memory loss, irritability and all other reviewed systems are negative.   ALLERGIES: Allergies  Allergen Reactions  . Lipitor [Atorvastatin Calcium]   . Codeine Nausea And Vomiting     HOME MEDICATIONS: Outpatient Medications Prior to Visit  Medication Sig Dispense Refill  . donepezil (ARICEPT) 10 MG tablet TAKE 1 TABLET AT  BEDTIME 90 tablet 1   No facility-administered medications prior to visit.     PAST MEDICAL HISTORY: Past Medical History:  Diagnosis Date  . History of substance abuse    Briefly spent time in substance abuse rehabilitation treatment as a teenager. Cocaine likely represented the primary drug of abuse.   Marland Kitchen Hyperlipidemia    "I used to take med. for cholesteol, but only for a short time"  . Major neurocognitive disorder 05/28/2019  . Menopausal syndrome   . Rheumatoid arthritis    Lumbar - spondylisthesis  . S/P lumbar spinal fusion 08/15/2016  . Vitamin D deficiency      PAST SURGICAL HISTORY: Past Surgical History:  Procedure Laterality Date  . ABDOMINAL HYSTERECTOMY    . TUBAL LIGATION    . VAGINAL DELIVERY     x2     FAMILY HISTORY: Family History  Problem Relation Age of Onset  . Alzheimer's  disease Paternal Grandmother      SOCIAL HISTORY: Social History   Socioeconomic History  . Marital status: Married    Spouse name: Not on file  . Number of children: Not on file  . Years of education: 39  . Highest education level: High school graduate  Occupational History  . Occupation: Retired    Comment: Designer, multimedia at [employer]  . Smoking status: Former Smoker    Types: Cigarettes    Quit date: 08/10/1975    Years since quitting: 44.4  . Smokeless tobacco: Never Used  Substance and Sexual Activity  . Alcohol use: Yes    Comment: very rarely  . Drug use: Not Currently    Types: Cocaine, Marijuana  . Sexual activity: Not on file  Other Topics Concern  . Not on file  Social History Narrative  . Not on file   Social Determinants of Health   Financial Resource Strain: Not on file  Food Insecurity: Not on file  Transportation Needs: Not on file  Physical Activity: Not on file  Stress: Not on file  Social Connections: Not on file  Intimate Partner Violence: Not on file      PHYSICAL EXAM  Vitals:   01/26/20 0922  BP: (!) 170/80  Pulse: 70  Weight: 180 lb (81.6 kg)  Height: 5' 5"  (1.651 m)   Body mass index is 29.95 kg/m.   Generalized: Well developed, in no acute distress  Cardiology: normal rate and rhythm, no murmur auscultated  Respiratory: clear to auscultation bilaterally    Neurological examination  Mentation: Alert, she is not oriented to time, but is oriented to place and some history taking. MMSE 12/30. Follows all commands with repeated instruction, speech and language fluent Cranial nerve II-XII: Pupils were equal round reactive to light. Extraocular movements were full, visual field were full on confrontational test. Facial sensation and strength were normal. Uvula tongue midline. Head turning and shoulder shrug  were normal and symmetric. Motor: The motor testing reveals 5 over 5 strength of all 4 extremities. Good symmetric  motor tone is noted throughout.  Sensory: Sensory testing is intact to soft touch on all 4 extremities. No evidence of extinction is noted.  Coordination: Cerebellar testing reveals good finger-nose-finger and heel-to-shin bilaterally.  Gait and station: Gait is arthritic    DIAGNOSTIC DATA (LABS, IMAGING, TESTING) - I reviewed patient records, labs, notes, testing and imaging myself where available.  Lab Results  Component Value Date   WBC 7.1 08/09/2016   HGB 13.9 08/09/2016   HCT 41.4  08/09/2016   MCV 88.1 08/09/2016   PLT 229 08/09/2016      Component Value Date/Time   NA 140 08/09/2016 1526   K 3.7 08/09/2016 1526   CL 106 08/09/2016 1526   CO2 25 08/09/2016 1526   GLUCOSE 93 08/09/2016 1526   BUN 10 08/09/2016 1526   CREATININE 0.71 08/09/2016 1526   CALCIUM 9.0 08/09/2016 1526   GFRNONAA >60 08/09/2016 1526   GFRAA >60 08/09/2016 1526   No results found for: CHOL, HDL, LDLCALC, LDLDIRECT, TRIG, CHOLHDL No results found for: HGBA1C No results found for: VITAMINB12 No results found for: TSH  MMSE - Mini Mental State Exam 01/26/2020 09/21/2019 12/16/2018  Orientation to time 0 1 1  Orientation to Place 3 2 1   Registration 3 3 3   Attention/ Calculation 0 0 0  Recall 0 0 1  Language- name 2 objects 2 2 2   Language- repeat 1 1 1   Language- follow 3 step command 2 1 0  Language- read & follow direction 1 1 1   Write a sentence 0 0 0  Copy design 0 0 0  Copy design-comments 3 animals - -  Total score 12 11 10      ASSESSMENT AND PLAN  59 y.o. year old female  has a past medical history of History of substance abuse, Hyperlipidemia, Major neurocognitive disorder (05/28/2019), Menopausal syndrome, Rheumatoid arthritis, S/P lumbar spinal fusion (08/15/2016), and Vitamin D deficiency. here with   Early onset Alzheimer's dementia without behavioral disturbance ([place])  Zyara seems to be doing fairly well since last being seen.  She appears to be tolerating Aricept  without any significant adverse effects.  We have discussed concerns of increased irritability and possible depression.  Her daughter-in-law feels that this is related to Kendre recognizing her deficits.  We have discussed options of adding Namenda versus an antidepressant.  Family wishes to start Namenda at this time.  I have instructed her daughter-in-law to start 5 mg nightly at bedtime for 2 weeks then increase dose to 5 mg twice daily.  She will monitor symptoms of irritability and depression very closely.  She will notify me for any concerns of worsening.  We will consider trial of Escitalopram versus sertraline in the future if needed with the assistance and blessing of PCP.  I have educated the family on the progressive nature of Alzheimer's the disease.  I have encouraged her to stay as active as possible.  Healthy lifestyle habits reviewed.  Memory compensation strategies encouraged.  She will follow-up in 3 months, sooner if needed.  She and her daughter-in-law both verbalized understanding and agreement with this plan.  Meds ordered this encounter  Medications  . memantine (NAMENDA) 5 MG tablet    Sig: Take 1 tablet (5 mg total) by mouth 2 (two) times daily.    Dispense:  180 tablet    Refill:  1    Order Specific Question:   Supervising Provider    Answer:   Melvenia Beam [id number]    I spent 30 minutes of face-to-face and non-face-to-face time with patient.  This included previsit chart review, lab review, study review, order entry, electronic health record documentation, patient education.    Debbora Presto, MSN, FNP-C 01/26/2020, 12:17 PM  [facility] [address] [place], [place] [address] [phone]  I reviewed the above note and documentation by the Nurse Practitioner and agree with the history, exam, assessment and plan as outlined above. I was available for consultation. Saima  Rexene Alberts, MD, PhD [facility])

## 2020-01-26 NOTE — Patient Instructions (Addendum)
Below is our plan:  We will continue Aricept  daily. We will add Namenda  daily for 2 weeks then increase to  twice daily. We will consider adding either escitalopram or sertraline if mood does not seem to improve.   Please make sure you are staying well hydrated. I recommend 50-60 ounces daily. Well balanced diet and regular exercise encouraged.   Please continue follow up with care team as directed.   Follow up in 3 months   You may receive a survey regarding today's visit. I encourage you to leave honest feed back as I do use this information to improve patient care. Thank you for seeing me today!     Memory Compensation Strategies  1. Use "WARM" strategy.  W= write it down  A= associate it  R= repeat it  M= make a mental note  2.   You can keep a Glass blower/designer.  Use a 3-ring notebook with sections for the following: calendar, important names and phone numbers,  medications, doctors' names/phone numbers, lists/reminders, and a section to journal what you did  each day.   3.    Use a calendar to write appointments down.  4.    Write yourself a schedule for the day.  This can be placed on the calendar or in a separate section of the Memory Notebook.  Keeping a  regular schedule can help memory.  5.    Use medication organizer with sections for each day or morning/evening pills.  You may need help loading it  6.    Keep a basket, or pegboard by the door.  Place items that you need to take out with you in the basket or on the pegboard.  You may also want to  include a message board for reminders.  7.    Use sticky notes.  Place sticky notes with reminders in a place where the task is performed.  For example: " turn off the  stove" placed by the stove, "lock the door" placed on the door at eye level, " take your medications" on  the bathroom mirror or by the place where you normally take your medications.  8.    Use alarms/timers.  Use while cooking to remind  yourself to check on food or as a reminder to take your medicine, or as a  reminder to make a call, or as a reminder to perform another task, etc.   Alzheimer's Disease Alzheimer's disease is a brain disease that affects memory, thinking, language, and behavior. People with Alzheimer's disease lose mental abilities, and the disease gets worse over time. Alzheimer's disease is a form of dementia. What are the causes? This condition develops when a protein called beta-amyloid forms deposits in the brain. It is not known what causes these deposits to form. Alzheimer's disease may also be caused by a gene mutation that is inherited from one parent or both parents. A gene mutation is a harmful change in a gene. Not everyone who inherits the genetic mutation will get the disease. What increases the risk? You are more likely to develop this condition if you:  Are older than age 36.  Have a family history of dementia.  Have had a brain injury.  Have heart or blood vessel disease.  Have had a stroke.  Have high blood pressure or high cholesterol.  Have diabetes. What are the signs or symptoms? Symptoms of this condition may happen in three stages, which often overlap. Early stage In this stage,  you may continue to be independent. You may still be able to drive, work, and be social. Symptoms in this stage include:  Minor memory problems, such as forgetting a name or what you read.  Difficulty with: ? Paying attention. ? Communicating. ? Doing familiar tasks. ? Problem solving or doing calculations. ? Following instructions. ? Learning new things.  Anxiety.  Social withdrawal.  Loss of motivation. Moderate stage In this stage, you will start to need care. Symptoms in this stage include:  Difficulty with expressing thoughts.  Memory loss that affects daily life. This can include forgetting: ? Your address or phone number. ? Recent events that have happened. ? Parts of your  personal history, such as where you went to school.  Confusion about where you are or what time it is.  Difficulty in judging distance.  Changes in personality, mood, and behavior. You may be moody, irritable, angry, frustrated, fearful, anxious, or suspicious.  Poor reasoning and judgment.  Delusions or hallucinations.  Changes in sleep patterns.  Wandering and getting lost, even in familiar places. Severe stage In the final stage, you will need help with your personal care and daily activities. Symptoms in this stage include:  Worsening memory loss.  Personality changes.  Loss of awareness of your surroundings.  Changes in physical abilities, including the ability to walk, sit, and swallow.  Difficulty in communicating.  Inability to control your bladder and bowels.  Increasing confusion.  Increasing behavior changes. How is this diagnosed? This condition is diagnosed by a health care provider who specializes in diseases of the nervous system (neurologist). Other causes of dementia may also be ruled out. Your health care provider will talk with you and your family, friends, or caregivers about your history and symptoms. A thorough medical history will be taken, and you will have a physical exam and tests. Tests may include:  Lab tests, such as blood or urine tests.  Imaging tests, such as a CT scan, a PET scan, or an MRI.  A lumbar puncture. This test involves removing and testing a small amount of the fluid that surrounds the brain and spinal cord.  An electroencephalogram (EEG). In this test, small metal discs are used to measure electrical activity in the brain.  Memory tests, cognitive tests, and neuropsychological tests. These tests evaluate brain function.  Genetic testing may be done if you have early onset of the disease (before age 55) or if other family members have the disease. How is this treated? At this time, there is no treatment to cure Alzheimer's  disease or stop it from getting worse. The goals of treatment are:  To slow down symptoms of the disease, if possible.  To manage behavioral changes.  To provide you with a safe environment.  To help manage daily life for you and your caregivers. The following treatment options are available:  Medicines. Medicines may help to slow down memory loss and manage behavioral symptoms.  Cognitive therapy. Cognitive therapy provides you with education, support, and memory aids. It is most helpful in the early stages of the condition.  Counseling or spiritual guidance. It is normal to have a lot of feelings, including anger, relief, fear, and isolation. Counseling and guidance can help you deal with these feelings.  Caregiving. This involves having caregivers help you with your daily activities.  Family support groups. These provide education, emotional support, and information about community resources to family members who are taking care of you. Follow these instructions at home:  Medicines  Take over-the-counter and prescription medicines only as told by your health care provider.  Use a pill organizer or pill reminder to help you manage your medicines.  Avoid taking medicines that can affect thinking, such as pain medicines or sleeping medicines. Lifestyle  Make healthy lifestyle choices: ? Be physically active as told by your health care provider. Regular exercise may help improve symptoms. ? Do not use any products that contain nicotine or tobacco, such as cigarettes, e-cigarettes, and chewing tobacco. If you need help quitting, ask your health care provider. ? Do not drink alcohol. ? Eat a healthy diet. ? Practice stress-management techniques when you get stressed. ? Stay social.  Drink enough fluid to keep your urine pale yellow.  Make sure to get quality sleep. ? Avoid taking long naps during the day. Take short naps of 30 minutes or less if needed. ? Keep your sleeping  area dark and cool. ? Avoid exercising during the few hours before you go to bed. ? Avoid caffeine products in the afternoon and evening. General instructions  Work with your health care provider to determine what you need help with and what your safety needs are.  If you were given a bracelet that identifies you as a person with memory loss or tracks your location, make sure to wear it at all times.  Talk with your health care provider about whether it is safe for you to drive.  Work with your family to make important decisions, such as advance directives, medical power of attorney, or a living will.  Keep all follow-up visits as told by your health care provider. This is important. Where to find more information  The Alzheimer[employer]: Call the 24-hour helpline at 913-048-9792, or visit [employer] Contact a health care provider if:  You have nausea, vomiting, or trouble with eating.  You have dizziness or weakness.  You or your family members become concerned for your safety. Get help right away if:  You feel depressed or sad, or feel that you want to harm yourself.  You develop chest pain or difficulty with breathing.  You pass out. If you ever feel like you may hurt yourself or others, or have thoughts about taking your own life, get help right away. You can go to your nearest emergency department or call:  Your local emergency services (911 in the [place]  A suicide crisis helpline, such as the National Suicide Prevention Lifeline at 7076394130. This is open 24 hours a day. Summary  Alzheimer's disease is a brain disease that affects memory, thinking, language, and behavior. Alzheimer's disease is a form of dementia.  This condition is diagnosed by a specialist in diseases of the nervous system (neurologist).  At this time, there is no treatment to cure Alzheimer's disease or stop it from getting worse. The goals of treatment are to slow memory loss and help  you manage any symptoms.  Work with your family to make important decisions, such as advance directives, medical power of attorney, or a living will. This information is not intended to replace advice given to you by your health care provider. Make sure you discuss any questions you have with your health care provider. Document Revised: 12/24/2017 Document Reviewed: 12/24/2017 Elsevier Patient Education  2020 Elsevier Inc.   Memantine Tablets What is this medicine? MEMANTINE (MEM an teen) is used to treat dementia caused by Alzheimer's disease. This medicine may be used for other purposes; ask your health care provider or pharmacist if you  have questions. COMMON BRAND NAME(S): Namenda What should I tell my health care provider before I take this medicine? They need to know if you have any of these conditions:  difficulty passing urine  kidney disease  liver disease  seizures  an unusual or allergic reaction to memantine, other medicines, foods, dyes, or preservatives  pregnant or trying to get pregnant  breast-feeding How should I use this medicine? Take this medicine by mouth with a glass of water. Follow the directions on the prescription label. You may take this medicine with or without food. Take your doses at regular intervals. Do not take your medicine more often than directed. Continue to take your medicine even if you feel better. Do not stop taking except on the advice of your doctor or health care professional. Talk to your pediatrician regarding the use of this medicine in children. Special care may be needed. Overdosage: If you think you have taken too much of this medicine contact a poison control center or emergency room at once. NOTE: This medicine is only for you. Do not share this medicine with others. What if I miss a dose? If you miss a dose, take it as soon as you can. If it is almost time for your next dose, take only that dose. Do not take double or extra  doses. If you do not take your medicine for several days, contact your health care provider. Your dose may need to be changed. What may interact with this medicine?  acetazolamide  amantadine  cimetidine  dextromethorphan  dofetilide  hydrochlorothiazide  ketamine  metformin  methazolamide  quinidine  ranitidine  sodium bicarbonate  triamterene This list may not describe all possible interactions. Give your health care provider a list of all the medicines, herbs, non-prescription drugs, or dietary supplements you use. Also tell them if you smoke, drink alcohol, or use illegal drugs. Some items may interact with your medicine. What should I watch for while using this medicine? Visit your doctor or health care professional for regular checks on your progress. Check with your doctor or health care professional if there is no improvement in your symptoms or if they get worse. You may get drowsy or dizzy. Do not drive, use machinery, or do anything that needs mental alertness until you know how this drug affects you. Do not stand or sit up quickly, especially if you are an older patient. This reduces the risk of dizzy or fainting spells. Alcohol can make you more drowsy and dizzy. Avoid alcoholic drinks. What side effects may I notice from receiving this medicine? Side effects that you should report to your doctor or health care professional as soon as possible:  allergic reactions like skin rash, itching or hives, swelling of the face, lips, or tongue  agitation or a feeling of restlessness  depressed mood  dizziness  hallucinations  redness, blistering, peeling or loosening of the skin, including inside the mouth  seizures  vomiting Side effects that usually do not require medical attention (report to your doctor or health care professional if they continue or are bothersome):  constipation  diarrhea  headache  nausea  trouble sleeping This list may not  describe all possible side effects. Call your doctor for medical advice about side effects. You may report side effects to FDA at 1-800-FDA-1088. Where should I keep my medicine? Keep out of the reach of children. Store at room temperature between 15 degrees and 30 degrees C (59 degrees and 86 degrees F). Throw  away any unused medicine after the expiration date. NOTE: This sheet is a summary. It may not cover all possible information. If you have questions about this medicine, talk to your doctor, pharmacist, or health care provider.  2020 Elsevier/Gold Standard (2012-11-03 14:10:42)   Escitalopram tablets What is this medicine? ESCITALOPRAM (es sye TAL oh pram) is used to treat depression and certain types of anxiety. This medicine may be used for other purposes; ask your health care provider or pharmacist if you have questions. COMMON BRAND NAME(S): Lexapro What should I tell my health care provider before I take this medicine? They need to know if you have any of these conditions:  bipolar disorder or a family history of bipolar disorder  diabetes  glaucoma  heart disease  kidney or liver disease  receiving electroconvulsive therapy  seizures (convulsions)  suicidal thoughts, plans, or attempt by you or a family member  an unusual or allergic reaction to escitalopram, the related drug citalopram, other medicines, foods, dyes, or preservatives  pregnant or trying to become pregnant  breast-feeding How should I use this medicine? Take this medicine by mouth with a glass of water. Follow the directions on the prescription label. You can take it with or without food. If it upsets your stomach, take it with food. Take your medicine at regular intervals. Do not take it more often than directed. Do not stop taking this medicine suddenly except upon the advice of your doctor. Stopping this medicine too quickly may cause serious side effects or your condition may worsen. A special  MedGuide will be given to you by the pharmacist with each prescription and refill. Be sure to read this information carefully each time. Talk to your pediatrician regarding the use of this medicine in children. Special care may be needed. Overdosage: If you think you have taken too much of this medicine contact a poison control center or emergency room at once. NOTE: This medicine is only for you. Do not share this medicine with others. What if I miss a dose? If you miss a dose, take it as soon as you can. If it is almost time for your next dose, take only that dose. Do not take double or extra doses. What may interact with this medicine? Do not take this medicine with any of the following medications:  certain medicines for fungal infections like fluconazole, itraconazole, ketoconazole, posaconazole, voriconazole  cisapride  citalopram  dronedarone  linezolid  MAOIs like Carbex, Eldepryl, Marplan, Nardil, and Parnate  methylene blue (injected into a vein)  pimozide  thioridazine This medicine may also interact with the following medications:  alcohol  amphetamines  aspirin and aspirin-like medicines  carbamazepine  certain medicines for depression, anxiety, or psychotic disturbances  certain medicines for migraine headache like almotriptan, eletriptan, frovatriptan, naratriptan, rizatriptan, sumatriptan, zolmitriptan  certain medicines for sleep  certain medicines that treat or prevent blood clots like warfarin, enoxaparin, dalteparin  cimetidine  diuretics  dofetilide  fentanyl  furazolidone  isoniazid  lithium  metoprolol  NSAIDs, medicines for pain and inflammation, like ibuprofen or naproxen  other medicines that prolong the QT interval (cause an abnormal heart rhythm)  procarbazine  rasagiline  supplements like St. John's wort, kava kava, valerian  tramadol  tryptophan  ziprasidone This list may not describe all possible interactions.  Give your health care provider a list of all the medicines, herbs, non-prescription drugs, or dietary supplements you use. Also tell them if you smoke, drink alcohol, or use illegal drugs. Some  items may interact with your medicine. What should I watch for while using this medicine? Tell your doctor if your symptoms do not get better or if they get worse. Visit your doctor or health care professional for regular checks on your progress. Because it may take several weeks to see the full effects of this medicine, it is important to continue your treatment as prescribed by your doctor. Patients and their families should watch out for new or worsening thoughts of suicide or depression. Also watch out for sudden changes in feelings such as feeling anxious, agitated, panicky, irritable, hostile, aggressive, impulsive, severely restless, overly excited and hyperactive, or not being able to sleep. If this happens, especially at the beginning of treatment or after a change in dose, call your health care professional. Bonita Quin may get drowsy or dizzy. Do not drive, use machinery, or do anything that needs mental alertness until you know how this medicine affects you. Do not stand or sit up quickly, especially if you are an older patient. This reduces the risk of dizzy or fainting spells. Alcohol may interfere with the effect of this medicine. Avoid alcoholic drinks. Your mouth may get dry. Chewing sugarless gum or sucking hard candy, and drinking plenty of water may help. Contact your doctor if the problem does not go away or is severe. What side effects may I notice from receiving this medicine? Side effects that you should report to your doctor or health care professional as soon as possible:  allergic reactions like skin rash, itching or hives, swelling of the face, lips, or tongue  anxious  black, tarry stools  changes in vision  confusion  elevated mood, decreased need for sleep, racing thoughts, impulsive  behavior  eye pain  fast, irregular heartbeat  feeling faint or lightheaded, falls  feeling agitated, angry, or irritable  hallucination, loss of contact with reality  loss of balance or coordination  loss of memory  painful or prolonged erections  restlessness, pacing, inability to keep still  seizures  stiff muscles  suicidal thoughts or other mood changes  trouble sleeping  unusual bleeding or bruising  unusually weak or tired  vomiting Side effects that usually do not require medical attention (report to your doctor or health care professional if they continue or are bothersome):  changes in appetite  change in sex drive or performance  headache  increased sweating  indigestion, nausea  tremors This list may not describe all possible side effects. Call your doctor for medical advice about side effects. You may report side effects to FDA at 1-800-FDA-1088. Where should I keep my medicine? Keep out of reach of children. Store at room temperature between 15 and 30 degrees C (59 and 86 degrees F). Throw away any unused medicine after the expiration date. NOTE: This sheet is a summary. It may not cover all possible information. If you have questions about this medicine, talk to your doctor, pharmacist, or health care provider.  2020 Elsevier/Gold Standard (2018-01-06 11:21:44)   Sertraline tablets What is this medicine? SERTRALINE (SER tra leen) is used to treat depression. It may also be used to treat obsessive compulsive disorder, panic disorder, post-trauma stress, premenstrual dysphoric disorder (PMDD) or social anxiety. This medicine may be used for other purposes; ask your health care provider or pharmacist if you have questions. COMMON BRAND NAME(S): Zoloft What should I tell my health care provider before I take this medicine? They need to know if you have any of these conditions:  bleeding disorders  bipolar disorder or a family history of  bipolar disorder  glaucoma  heart disease  high blood pressure  history of irregular heartbeat  history of low levels of calcium, magnesium, or potassium in the blood  if you often drink alcohol  liver disease  receiving electroconvulsive therapy  seizures  suicidal thoughts, plans, or attempt; a previous suicide attempt by you or a family member  take medicines that treat or prevent blood clots  thyroid disease  an unusual or allergic reaction to sertraline, other medicines, foods, dyes, or preservatives  pregnant or trying to get pregnant  breast-feeding How should I use this medicine? Take this medicine by mouth with a glass of water. Follow the directions on the prescription label. You can take it with or without food. Take your medicine at regular intervals. Do not take your medicine more often than directed. Do not stop taking this medicine suddenly except upon the advice of your doctor. Stopping this medicine too quickly may cause serious side effects or your condition may worsen. A special MedGuide will be given to you by the pharmacist with each prescription and refill. Be sure to read this information carefully each time. Talk to your pediatrician regarding the use of this medicine in children. While this drug may be prescribed for children as young as 7 years for selected conditions, precautions do apply. Overdosage: If you think you have taken too much of this medicine contact a poison control center or emergency room at once. NOTE: This medicine is only for you. Do not share this medicine with others. What if I miss a dose? If you miss a dose, take it as soon as you can. If it is almost time for your next dose, take only that dose. Do not take double or extra doses. What may interact with this medicine? Do not take this medicine with any of the following medications:  cisapride  dronedarone  linezolid  MAOIs like Carbex, Eldepryl, Marplan, Nardil, and  Parnate  methylene blue (injected into a vein)  pimozide  thioridazine This medicine may also interact with the following medications:  alcohol  amphetamines  aspirin and aspirin-like medicines  certain medicines for depression, anxiety, or psychotic disturbances  certain medicines for fungal infections like ketoconazole, fluconazole, posaconazole, and itraconazole  certain medicines for irregular heart beat like flecainide, quinidine, propafenone  certain medicines for migraine headaches like almotriptan, eletriptan, frovatriptan, naratriptan, rizatriptan, sumatriptan, zolmitriptan  certain medicines for sleep  certain medicines for seizures like carbamazepine, valproic acid, phenytoin  certain medicines that treat or prevent blood clots like warfarin, enoxaparin, dalteparin  cimetidine  digoxin  diuretics  fentanyl  isoniazid  lithium  NSAIDs, medicines for pain and inflammation, like ibuprofen or naproxen  other medicines that prolong the QT interval (cause an abnormal heart rhythm) like dofetilide  rasagiline  safinamide  supplements like St. John's wort, kava kava, valerian  tolbutamide  tramadol  tryptophan This list may not describe all possible interactions. Give your health care provider a list of all the medicines, herbs, non-prescription drugs, or dietary supplements you use. Also tell them if you smoke, drink alcohol, or use illegal drugs. Some items may interact with your medicine. What should I watch for while using this medicine? Tell your doctor if your symptoms do not get better or if they get worse. Visit your doctor or health care professional for regular checks on your progress. Because it may take several weeks to see the full effects of this medicine, it is  important to continue your treatment as prescribed by your doctor. Patients and their families should watch out for new or worsening thoughts of suicide or depression. Also watch out  for sudden changes in feelings such as feeling anxious, agitated, panicky, irritable, hostile, aggressive, impulsive, severely restless, overly excited and hyperactive, or not being able to sleep. If this happens, especially at the beginning of treatment or after a change in dose, call your health care professional. Bonita Quin may get drowsy or dizzy. Do not drive, use machinery, or do anything that needs mental alertness until you know how this medicine affects you. Do not stand or sit up quickly, especially if you are an older patient. This reduces the risk of dizzy or fainting spells. Alcohol may interfere with the effect of this medicine. Avoid alcoholic drinks. Your mouth may get dry. Chewing sugarless gum or sucking hard candy, and drinking plenty of water may help. Contact your doctor if the problem does not go away or is severe. What side effects may I notice from receiving this medicine? Side effects that you should report to your doctor or health care professional as soon as possible:  allergic reactions like skin rash, itching or hives, swelling of the face, lips, or tongue  anxious  black, tarry stools  changes in vision  confusion  elevated mood, decreased need for sleep, racing thoughts, impulsive behavior  eye pain  fast, irregular heartbeat  feeling faint or lightheaded, falls  feeling agitated, angry, or irritable  hallucination, loss of contact with reality  loss of balance or coordination  loss of memory  painful or prolonged erections  restlessness, pacing, inability to keep still  seizures  stiff muscles  suicidal thoughts or other mood changes  trouble sleeping  unusual bleeding or bruising  unusually weak or tired  vomiting Side effects that usually do not require medical attention (report to your doctor or health care professional if they continue or are bothersome):  change in appetite or weight  change in sex drive or  performance  diarrhea  increased sweating  indigestion, nausea  tremors This list may not describe all possible side effects. Call your doctor for medical advice about side effects. You may report side effects to FDA at 1-800-FDA-1088. Where should I keep my medicine? Keep out of the reach of children. Store at room temperature between 15 and 30 degrees C (59 and 86 degrees F). Throw away any unused medicine after the expiration date. NOTE: This sheet is a summary. It may not cover all possible information. If you have questions about this medicine, talk to your doctor, pharmacist, or health care provider.  2020 Elsevier/Gold Standard (2018-01-07 10:09:27)

## 2020-03-08 ENCOUNTER — Other Ambulatory Visit: Payer: Self-pay | Admitting: Neurology

## 2020-05-02 ENCOUNTER — Ambulatory Visit: Payer: BLUE CROSS/BLUE SHIELD | Admitting: Family Medicine

## 2020-06-14 ENCOUNTER — Ambulatory Visit: Payer: BLUE CROSS/BLUE SHIELD | Admitting: Family Medicine

## 2020-06-14 ENCOUNTER — Encounter: Payer: Self-pay | Admitting: Family Medicine

## 2020-06-14 VITALS — BP 154/98 | HR 70 | Ht 65.0 in | Wt 195.0 lb

## 2020-06-14 DIAGNOSIS — G300 Alzheimer's disease with early onset: Secondary | ICD-10-CM

## 2020-06-14 DIAGNOSIS — F0280 Dementia in other diseases classified elsewhere without behavioral disturbance: Secondary | ICD-10-CM

## 2020-06-14 MED ORDER — DONEPEZIL HCL 10 MG PO TABS
ORAL_TABLET | ORAL | 1 refills | Status: DC
Start: 2020-06-14 — End: 2021-03-14

## 2020-06-14 MED ORDER — MEMANTINE HCL 5 MG PO TABS: 5.0000 mg | ORAL_TABLET | Freq: Two times a day (BID) | ORAL | 1 refills | Status: DC

## 2020-06-14 NOTE — Progress Notes (Addendum)
Chief Complaint  Patient presents with  . Follow-up    RM 2, with daughter in law, Donna Jacobs, pt states that new medication Namenda 80m BID has helped. Reports no side effects. Pt has a rash on her left side of her face. Onset over 2 weeks.      HISTORY OF PRESENT ILLNESS: 06/14/20 ALL: Donna Jacobs for follow up for early onset AD. She continues Aricept 183mdaily. Namenda 90m17mID was added at last follow up in 12/2019. Mood has been much better. She is less irritable. She continues to live with her husband, Donna Jacobs requires some assistance with ADL's. Medications are given by her family. She is eating and sleeping well. No changes in gait. No falls. She is not as active as she used to be. She lives to sit and watch TV. She likes to drink Dr PepMalachi Bonds  01/26/2020 ALL:  Donna Jacobs a 60 8o. female here today for follow up for early onset AD. Overall, she seems to be about the same since last being seen. Her daughter in law presents with her today and aids in history. She reports that around September, she noticed that she seemed more irritable and depressed. No previous history of depression. No concerns of SI/HI. She was a very active person when healthy. Now she is not as active and gets very frustrated when she can't go out, shop or visit with friends. She does not seem to understand why she is not Jacobs to go out.  She lives with her husband. She does not drive. Mr HolKarpves medications and prepares all foods. Donna Jacobs to perform most other ADL's independently.    HISTORY (copied from previous note)  Donna Jacobs a 60 76ar old right-handed woman with an underlying medical history of vitamin D deficiency, hyperlipidemia, arthritis, former smoking and obesity, whopresents for follow-up consultation of her memory loss of 3+ years duration. The patient is accompanied by her daughter-in-law, Donna Jacobs, today. I last saw her on 06/18/2019, at which time we talked about her recent  test results including her MRI and neuropsychological evaluation.  She was agreeable to pursuing a PET scan.  She was advised to start Aricept 10 mg strength half a pill with increase to 1 pill after about a month.  She had an interim brain metabolic PET scan on 07/09/28/8657d I reviewed the results: IMPRESSION: 1. Symmetric decreased radiotracer activity within bilateral parietal lobes compatible with a pattern seen with Alzheimer's dementia.  Her mother was contacted with her test results.  Today, 09/21/2019: She reports doing fairly well.  Sometimes she feels her donepezil was making her tired and her eyelids become heavy.  She has a routine eye examination pending for later this year in November.  She has not fallen asleep inadvertently during the day.  In the first 2 weeks after starting the full dose of donepezil she was notably irritable per daughter-in-law.  She had some anger type reactions that were not like her.  It did help to take a break and go to the beach with the family.  She did well with that.  She has had some intermittent diarrhea but has a history of having diarrhea in the past, she saw GI in the past, has not seen a GI specialist as of late.  All in all, she is Jacobs to tolerate the donepezil and she has not had any mood irritability lately.  She is interested in watching TV.  She  does not do a whole lot in terms of other activities.  She has not been Jacobs to exercise on a regular basis, walking outside has been possible intermittently weather permitting.  She has increased her water intake and decreased her soda intake.  She has not fallen recently.  She has seen rheumatology and had significant amount of blood work per daughter-in-law and was told to start vitamin D.  She has been using a gummy type multivitamin, no separate vitamin D.  They are not sure how much vitamin D is in it.    The patient's allergies, current medications, family history, past medical history, past social  history, past surgical history and problem list were reviewed and updated as appropriate.  Previously:    I first met her on 12/16/2018 at the request of her primary care, at which time the patient reported a history of memory loss, she had been accompanied by her friend at the time. She had recently had blood work through her primary care and I suggested further work-up in the form of brain MRI and also seeking consultation with neuropsychology.  She had a brain MRI without contrast on 12/30/2018 and I reviewed the results: :   MRI brain (without) demonstrating: - Mild periventricular and subcortical foci of T2 hyperintensities. These findings are non-specific and considerations include autoimmune, inflammatory, post-infectious, microvascular ischemic or migraine associated etiologies.  - No acute findings.  She had consultation, evaluation and feedback appointment with Dr. Melvyn Novas, neuropsychologist at [facility] neurology and I reviewed his impression and recommendations.    His evaluation was supportive of early onset Alzheimer's disease. Further evaluation was recommended in the form of PET scan or CSF evaluation. Medication recommendations included anticholinergic dementia medication or Namenda.   12/16/18: (She) reports memory loss for the past 3+ years. The patient has had forgetfulness and often misplaces things. She has been driving. Her friend has been Jacobs to observe her driving and feels that she has done well with local and familiar routes. However, patient does report that she has gotten lost driving at times. She continues to work. She works for [employer], [employer], and has worked for the same company for over 30 years. She has had some mood issues including frustration, her husband has also been frustrated with her as I understand. She lives with her husband, she has 2 grown sons. She has 3 grandsons. She quit smoking over 20 years ago. She has a remote history  of drug abuse and was in rehab when she was a teenager or young adults. She admits to trying cocaine but denies any IV drug use, she denies any recent drug use or relapse, she has smoked marijuana as well. She does not hydrate very well with water she admits. She likes to drink Diet Coke. She is not sure how many cans she drinks per day. She does not currently drink any alcohol. She denies any heavy alcohol use or alcohol abuse in the past. She does not snore. Donna Jacobs has not noticed any gasping sounds or snoring sounds when they have shared a room before on a trip.She reports that her paternal grandmother had Alzheimer's disease. Paternal aunt had no dementia. The patient is the oldest of 22, she has a brother and a sister, neither 1 have memory loss. I reviewed your office note from 10/27/2018. She was referred to rheumatology at the time for a presumed diagnosis of rheumatoid arthritis. She was also referred to orthopedics for leg length discrepancy. She had blood work  at the time including CBC with differential, B12, TSH, vitamin D. Her B12 level was 257, vitamin D Jacobs at 21.9, TSH was normal, CBC with differential was unremarkable.  She is followed by rheumatology for her arthritis and prior diagnosis of rheumatoid arthritis.   REVIEW OF SYSTEMS: Out of a complete 14 system review of symptoms, the patient complains only of the following symptoms, memory loss, and all other reviewed systems are negative.   ALLERGIES: Allergies  Allergen Reactions  . Lipitor [Atorvastatin Calcium]   . Codeine Nausea And Vomiting     HOME MEDICATIONS: Outpatient Medications Prior to Visit  Medication Sig Dispense Refill  . donepezil (ARICEPT) 10 MG tablet TAKE 1 TABLET BY MOUTH AT BEDTIME 90 tablet 1  . memantine (NAMENDA) 5 MG tablet Take 1 tablet (5 mg total) by mouth 2 (two) times daily. 180 tablet 1  . Multiple Vitamins-Minerals (MULTIVITAMIN WOMEN) TABS Take 1 tablet by mouth daily in the  afternoon.     No facility-administered medications prior to visit.     PAST MEDICAL HISTORY: Past Medical History:  Diagnosis Date  . History of substance abuse    Briefly spent time in substance abuse rehabilitation treatment as a teenager. Cocaine likely represented the primary drug of abuse.   Marland Kitchen Hyperlipidemia    "I used to take med. for cholesteol, but only for a short time"  . Major neurocognitive disorder 05/28/2019  . Menopausal syndrome   . Rheumatoid arthritis    Lumbar - spondylisthesis  . S/P lumbar spinal fusion 08/15/2016  . Vitamin D deficiency      PAST SURGICAL HISTORY: Past Surgical History:  Procedure Laterality Date  . ABDOMINAL HYSTERECTOMY    . TUBAL LIGATION    . VAGINAL DELIVERY     x2     FAMILY HISTORY: Family History  Problem Relation Age of Onset  . Alzheimer's disease Paternal Grandmother      SOCIAL HISTORY: Social History   Socioeconomic History  . Marital status: Married    Spouse name: Not on file  . Number of children: Not on file  . Years of education: 36  . Highest education level: High school graduate  Occupational History  . Occupation: Retired    Comment: Designer, multimedia at [employer]  . Smoking status: Former Smoker    Types: Cigarettes    Quit date: 08/10/1975    Years since quitting: 44.8  . Smokeless tobacco: Never Used  Substance and Sexual Activity  . Alcohol use: Yes    Comment: very rarely  . Drug use: Not Currently    Types: Cocaine, Marijuana  . Sexual activity: Not on file  Other Topics Concern  . Not on file  Social History Narrative  . Not on file   Social Determinants of Health   Financial Resource Strain: Not on file  Food Insecurity: Not on file  Transportation Needs: Not on file  Physical Activity: Not on file  Stress: Not on file  Social Connections: Not on file  Intimate Partner Violence: Not on file      PHYSICAL EXAM  There were no vitals filed for this visit. There is  no height or weight on file to calculate BMI.   Generalized: Well developed, in no acute distress  Cardiology: normal rate and rhythm, no murmur auscultated  Respiratory: clear to auscultation bilaterally    Neurological examination  Mentation: Alert, she is not oriented to time, but is oriented to place and some history taking.  MMSE 12/30. Follows all commands with repeated instruction, speech and language fluent Cranial nerve II-XII: Pupils were equal round reactive to light. Extraocular movements were full, visual field were full on confrontational test. Facial sensation and strength were normal. Uvula tongue midline. Head turning and shoulder shrug  were normal and symmetric. Motor: The motor testing reveals 5 over 5 strength of all 4 extremities. Good symmetric motor tone is noted throughout.  Sensory: Sensory testing is intact to soft touch on all 4 extremities. No evidence of extinction is noted.  Coordination: Cerebellar testing reveals good finger-nose-finger and heel-to-shin bilaterally.  Gait and station: Gait is arthritic    DIAGNOSTIC DATA (LABS, IMAGING, TESTING) - I reviewed patient records, labs, notes, testing and imaging myself where available.  Lab Results  Component Value Date   WBC 7.1 08/09/2016   HGB 13.9 08/09/2016   HCT 41.4 08/09/2016   MCV 88.1 08/09/2016   PLT 229 08/09/2016      Component Value Date/Time   NA 140 08/09/2016 1526   K 3.7 08/09/2016 1526   CL 106 08/09/2016 1526   CO2 25 08/09/2016 1526   GLUCOSE 93 08/09/2016 1526   BUN 10 08/09/2016 1526   CREATININE 0.71 08/09/2016 1526   CALCIUM 9.0 08/09/2016 1526   GFRNONAA >60 08/09/2016 1526   GFRAA >60 08/09/2016 1526   No results found for: CHOL, HDL, LDLCALC, LDLDIRECT, TRIG, CHOLHDL No results found for: HGBA1C No results found for: VITAMINB12 No results found for: TSH  MMSE - Mini Mental State Exam 06/14/2020 01/26/2020 09/21/2019  Orientation to time 1 0 1  Orientation to Place _0 Registration _1 Attention/ Calculation 0 0 0  Recall 0 0 0  Language- name 2 objects _2 Language- repeat _3 Language- follow 3 step command _4 Language- read & follow direction 0 1 1  Write a sentence 0 0 0  Copy design 0 0 0  Copy design-comments - 3 animals -  Total score _5 ASSESSMENT AND PLAN  60 y.o. year old female  has a past medical history of History of substance abuse, Hyperlipidemia, Major neurocognitive disorder (05/28/2019), Menopausal syndrome, Rheumatoid arthritis, S/P lumbar spinal fusion (08/15/2016), and Vitamin D deficiency. here with   Early onset Alzheimer's dementia without behavioral disturbance ([place])  Cynai is doing well, today. She has tolerated donepezil 59m daily and memantine 528mtwice daily. We will continue current treatment plan. Irritability has improved. I have offered to increase memantine to 1063mID, however, her family will discuss and let me know. I have educated the family on the progressive nature of Alzheimer's the disease. I have educated her daughter in law on community resources as well as how to find more information through [email or web address]-[email or web address] have encouraged her to stay as active as possible.  Healthy lifestyle habits reviewed.  Memory compensation strategies encouraged.  She will follow-up in 6 months, sooner if needed.  She and her daughter-in-law both verbalized understanding and agreement with this plan.  No orders of the defined types were placed in this encounter.    AmyDebbora PrestoSN, FNP-C 06/14/2020, 9:55 AM  [facility] [id number] [phone] reviewed the above note and documentation by the Nurse Practitioner and agree with the history, exam, assessment and plan as outlined above. I was available for consultation. SaiStar AgeD, PhD [facility]

## 2020-06-14 NOTE — Patient Instructions (Addendum)
Below is our plan:  We will continue donepezil  daily and memantine  twice daily. We may consider increasing memantine to  twice daily if you wish. I do recommend limiting Dr Reino Kent to 1 per day. Consider splitting can into two servings. I feel regular physical and mental exercise. Consider a daily walk. Brain games are helpful. Consider the MIND diet. Consider looking into [facility]. They have a community support for patients with memory loss. It could be helpful for social interaction and mental stimulation.   Please make sure you are staying well hydrated. I recommend 50-60 ounces daily. Well balanced diet and regular exercise encouraged. Consistent sleep schedule with 6-8 hours recommended.   Please continue follow up with care team as directed.   Follow up with with me in 6 months   You may receive a survey regarding today's visit. I encourage you to leave honest feed back as I do use this information to improve patient care. Thank you for seeing me today!      Management of Memory Problems   There are some general things you can do to help manage your memory problems.  Your memory may not in fact recover, but by using techniques and strategies you will be able to manage your memory difficulties better.   1)  Establish a routine. ? Try to establish and then stick to a regular routine.  By doing this, you will get used to what to expect and you will reduce the need to rely on your memory.  Also, try to do things at the same time of day, such as taking your medication or checking your calendar first thing in the morning. ? Think about think that you can do as a part of a regular routine and make a list.  Then enter them into a daily planner to remind you.  This will help you establish a routine.   2)  Organize your environment. ? Organize your environment so that it is uncluttered.  Decrease visual stimulation.  Place everyday items such as keys or cell phone in the same  place every day (ie.  Basket next to front door) ? Use post it notes with a brief message to yourself (ie. Turn off light, lock the door) ? Use labels to indicate where things go (ie. Which cupboards are for food, dishes, etc.) ? Keep a notepad and pen by the telephone to take messages   3)  Memory Aids ? A diary or journal/notebook/daily planner ? Making a list (shopping list, chore list, to do list that needs to be done) ? Using an alarm as a reminder (kitchen timer or cell phone alarm) ? Using cell phone to store information (Notes, Calendar, Reminders) ? Calendar/White board placed in a prominent position ? Post-it notes   In order for memory aids to be useful, you need to have good habits.  It's no good remembering to make a note in your journal if you don't remember to look in it.  Try setting aside a certain time of day to look in journal.   4)  Improving mood and managing fatigue. 1. There may be other factors that contribute to memory difficulties.  Factors, such as anxiety, depression and tiredness can affect memory.  Regular gentle exercise can help improve your mood and give you more energy.  Simple relaxation techniques may help relieve symptoms of anxiety  Try to get back to completing activities or hobbies you enjoyed doing in the past.  Learn  to pace yourself through activities to decrease fatigue.  Find out about some local support groups where you can share experiences with others.  Try and achieve 7-8 hours of sleep at night.     Dementia Caregiver Guide Dementia is a term used to describe a number of symptoms that affect memory and thinking. The most common symptoms include:  Memory loss.  Trouble with language and communication.  Trouble concentrating.  Poor judgment and problems with reasoning.  Wandering from home or public places.  Extreme anxiety or depression.  Being suspicious or having angry outbursts and accusations.  Child-like behavior  and language. Dementia can be frightening and confusing. And taking care of someone with dementia can be challenging. This guide provides tips to help you when providing care for a person with dementia. How to help manage lifestyle changes Dementia usually gets worse slowly over time. In the early stages, people with dementia can stay independent and safe with some help. In later stages, they need help with daily tasks such as dressing, grooming, and using the bathroom. There are actions you can take to help a person manage his or her life while living with this condition. Communicating  When the person is talking or seems frustrated, make eye contact and hold the person's hand.  Ask specific questions that need yes or no answers.  Use simple words, short sentences, and a calm voice. Only give one direction at a time.  When offering choices, limit the person to just one or two.  Avoid correcting the person in a negative way.  If the person is struggling to find the right words, gently try to help him or her. Preventing injury  Keep floors clear of clutter. Remove rugs, magazine racks, and floor lamps.  Keep hallways well lit, especially at night.  Put a handrail and nonslip mat in the bathtub or shower.  Put childproof locks on cabinets that contain dangerous items, such as medicines, alcohol, guns, toxic cleaning items, sharp tools or utensils, matches, and lighters.  For doors to the outside of the house, put the locks in places where the person cannot see or reach them easily. This will help ensure that the person does not wander out of the house and get lost.  Be prepared for emergencies. Keep a list of emergency phone numbers and addresses in a convenient area.  Remove car keys and lock garage doors so that the person does not try to get in the car and drive.  Have the person wear a bracelet that tracks locations and identifies the person as having memory problems. This should  be worn at all times for safety.   Helping with daily life  Keep the person on track with his or her routine.  Try to identify areas where the person may need help.  Be supportive, patient, calm, and encouraging.  Gently remind the person that adjusting to changes takes time.  Help with the tasks that the person has asked for help with.  Keep the person involved in daily tasks and decisions as much as possible.  Encourage conversation, but try not to get frustrated if the person struggles to find words or does not seem to appreciate your help.   How to recognize stress Look for signs of stress in yourself and in the person you are caring for. If you notice signs of stress, take steps to manage it. Symptoms of stress include:  Feeling anxious, irritable, frustrated, or angry.  Denying that the person  has dementia or that his or her symptoms will not improve.  Feeling depressed, hopeless, or unappreciated.  Difficulty sleeping.  Difficulty concentrating.  Developing stress-related health problems.  Feeling like you have too little time for your own life. Follow these instructions at home: Take care of your health Make sure that you and the person you are caring for:  Get regular sleep.  Exercise regularly.  Eat regular, nutritious meals.  Take over-the-counter and prescription medicines only as told by your health care providers.  Drink enough fluid to keep your urine pale yellow.  Attend all scheduled health care appointments.   General instructions  Join a support group with others who are caregivers.  Ask about respite care resources. Respite care can provide short-term care for the person so that you can have a regular break from the stress of caregiving.  Consider any safety risks and take steps to avoid them.  Organize medicines in a pill box for each day of the week.  Create a plan to handle any legal or financial matters. Get legal or financial advice if  needed.  Keep a calendar in a central location to remind the person of appointments or other activities. Where to find support: Many individuals and organizations offer support. These include:  Support groups for people with dementia.  Support groups for caregivers.  Counselors or therapists.  Home health care services.  Adult day care centers. Where to find more information  Centers for Disease Control and Prevention: FootballExhibition.com.br  Alzheimer's Association: LimitLaws.hu  Family Caregiver Alliance: www.caregiver.org  Alzheimer's Foundation of [place]: www.alzfdn.org Contact a health care provider if:  The person's health is rapidly getting worse.  You are no longer able to care for the person.  Caring for the person is affecting your physical and emotional health.  You are feeling depressed or anxious about caring for the person. Get help right away if:  The person threatens himself or herself, you, or anyone else.  You feel depressed or sad, or feel that you want to harm yourself. If you ever feel like your loved one may hurt himself or herself or others, or if he or she shares thoughts about taking his or her own life, get help right away. You can go to your nearest emergency department or:  Call your local emergency services (911 in the [place]  Call a suicide crisis helpline, such as the National Suicide Prevention Lifeline at 517-676-5746. This is open 24 hours a day in the [place]  Text the Crisis Text Line at 303-286-9011 (in the [place] Summary  Dementia is a term used to describe a number of symptoms that affect memory and thinking.  Dementia usually gets worse slowly over time.  Take steps to reduce the person's risk of injury and to plan for future care.  Caregivers need support, relief from caregiving, and time for their own lives. This information is not intended to replace advice given to you by your health care provider. Make sure you discuss any questions you  have with your health care provider. Document Revised: 06/01/2019 Document Reviewed: 06/01/2019 Elsevier Patient Education  2021 [employer].

## 2020-08-15 ENCOUNTER — Telehealth: Payer: Self-pay | Admitting: Family Medicine

## 2020-08-15 NOTE — Telephone Encounter (Signed)
Called the pt's. Daughter in law back.  She states that in July she randomly developed a cramp suddenly that became painful and was hard to ease up. At that time the pt mentioned to her that had been happening off and on before.  The daughter in law was concerned if this could be coming from the medication. I asked when the patient started the medication and she states it was started around December timeframe. Advised that its odd that it would just start up now if she had been on the medication for that amount of time. I asked had the patient seen her primary care and she stated that she would like to keep things especially at this stage all together. I informed her that it may be that she needs to have her labs evaluated.  When I encouraged her this should be done through pcp first she wanted me to just discuss with Amy first. Advised I would and that I would call her back.

## 2020-08-15 NOTE — Telephone Encounter (Signed)
Called the daughter in law back and advised that Amy agrees that the patient should follow up with her PCP office. Given there may be several labs that need to be ordered, she wouldn't know everything that would need to be included, nor would she be able to complete further treatment and manage  care if it is a primary care concern. She verbalized understanding and had no other questions at this time.

## 2020-08-15 NOTE — Telephone Encounter (Signed)
Daughter in law Winter [place] on [place] called and LVM wanting to speak to the RN regarding the pt's medications and the leg cramping that the pt is experiencing. Please call Winter and not the pt to discuss.

## 2020-08-18 ENCOUNTER — Other Ambulatory Visit: Payer: Self-pay | Admitting: Neurology

## 2020-08-18 MED ORDER — MEMANTINE HCL 5 MG PO TABS: 5.0000 mg | ORAL_TABLET | Freq: Two times a day (BID) | ORAL | 1 refills | Status: DC

## 2020-09-04 ENCOUNTER — Encounter: Payer: Self-pay | Admitting: Family Medicine

## 2020-10-21 IMAGING — CT NM PET TUM IMG INITIAL (PI) WHOLE BODY
6 series · 25 of 25 positions shown · non-contrast
Comparison: Brain MRI 12/30/2018

CLINICAL DATA: Progressive memory loss. Evaluate for Alzheimer's
type dementia

EXAM:
NM PET METABOLIC BRAIN
TECHNIQUE: 9.5 mCi F-18 FDG was injected intravenously. Full-ring PET imaging
was performed from the vertex to skull base. CT data was obtained
and used for attenuation correction and anatomic localization.
FASTING BLOOD GLUCOSE:  Value: 92 mg/dl

[Series 3: pet brain ac · axial · 3.0mm · 2.04mm/px · z∈[-97,+79]mm · 5 of 89 slices shown]
[im 1/89]
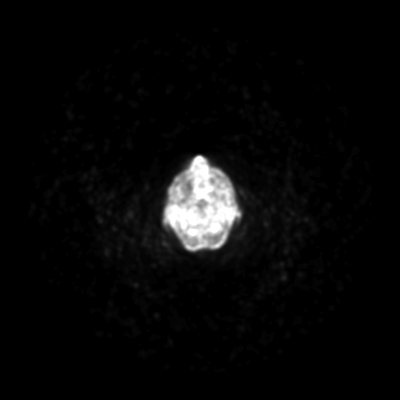
[im 23/89]
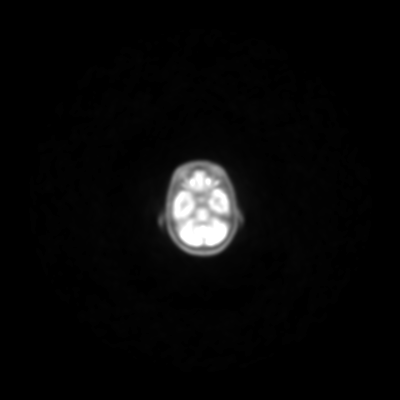
[im 45/89]
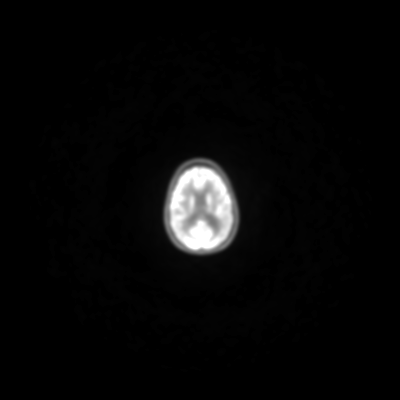
[im 67/89]
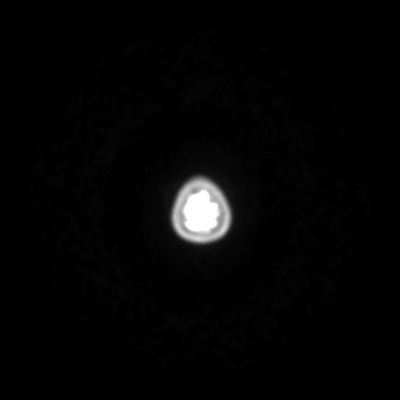
[im 89/89]
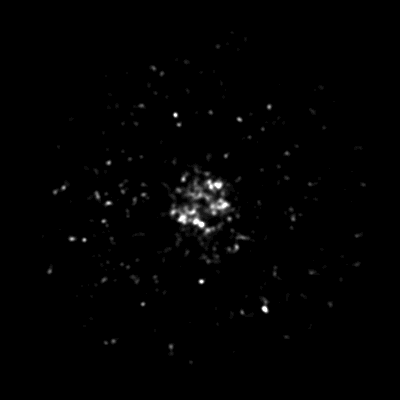

[Series 4: ct brain 3.0 h31s · axial · 3.0mm · 0.47mm/px · z∈[-97,+79]mm · 6 of 89 slices shown]
[im 1/89  brain]
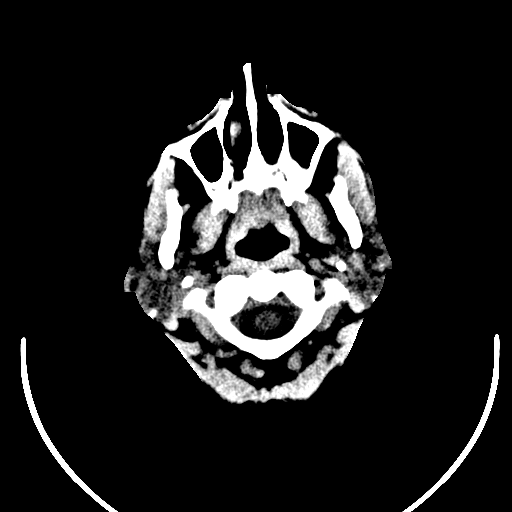
[im 18/89  brain]
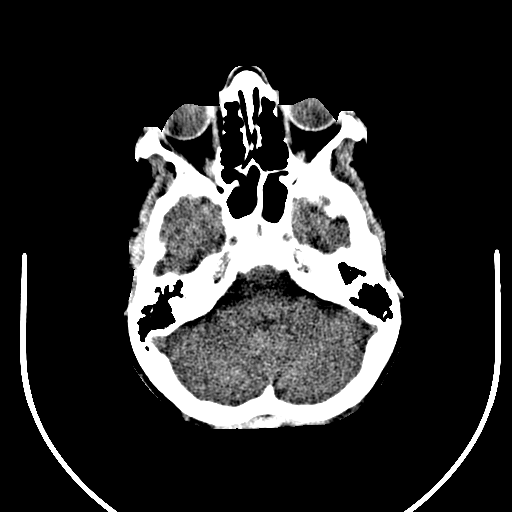
[im 36/89  brain]
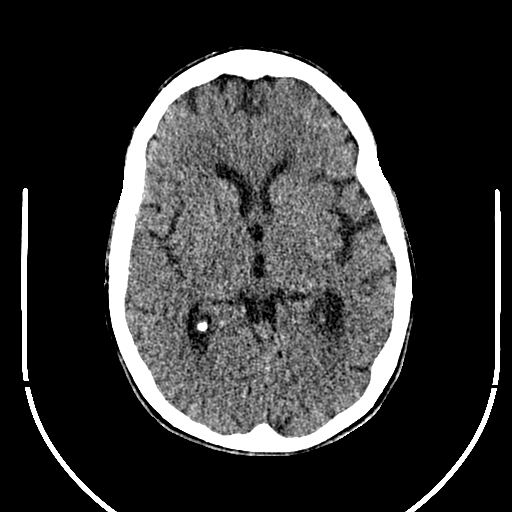
[im 53/89  brain]
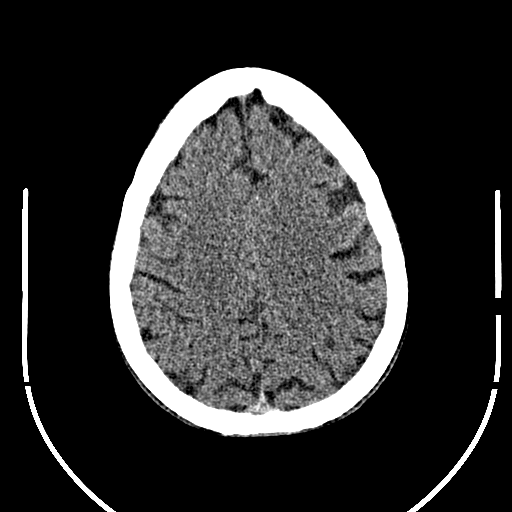
[im 71/89  bone]
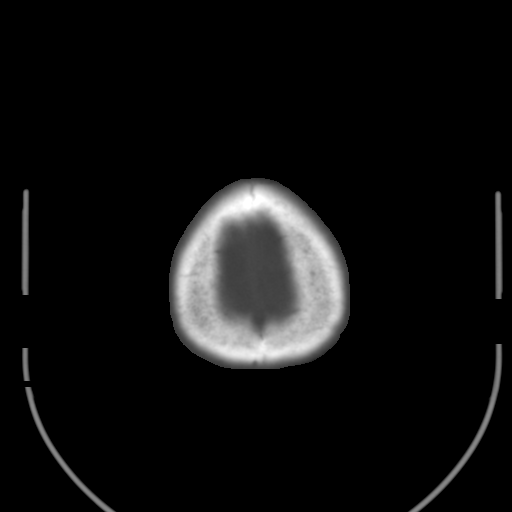
[im 89/89]
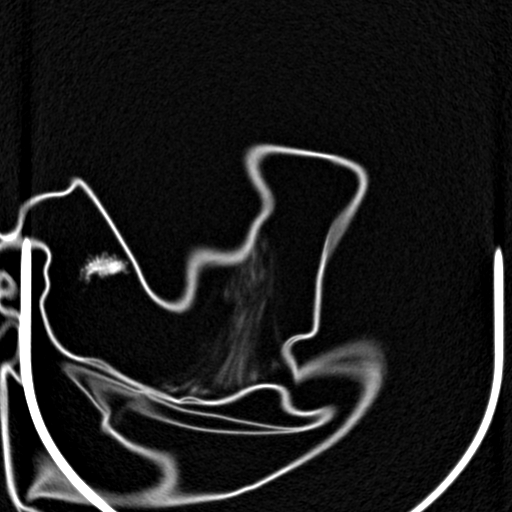

[Series 5: pet brain ac itr · axial · 3.0mm · 4.07mm/px · z∈[-97,+79]mm · 6 of 89 slices shown]
[im 1/89]
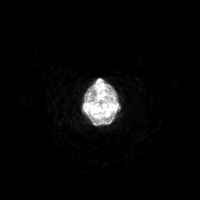
[im 18/89]
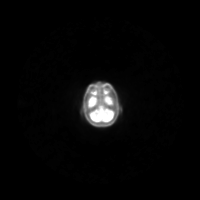
[im 36/89]
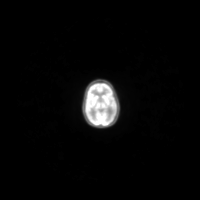
[im 53/89]
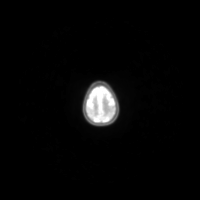
[im 71/89]
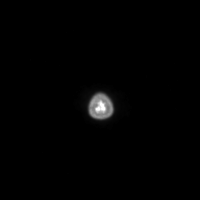
[im 89/89]
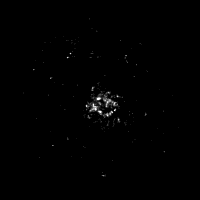

[Series 603: range-ct brain 3.0 (id)<alpha range> · 3 of 44 slices shown (1 of 2)]
[im 1/44]
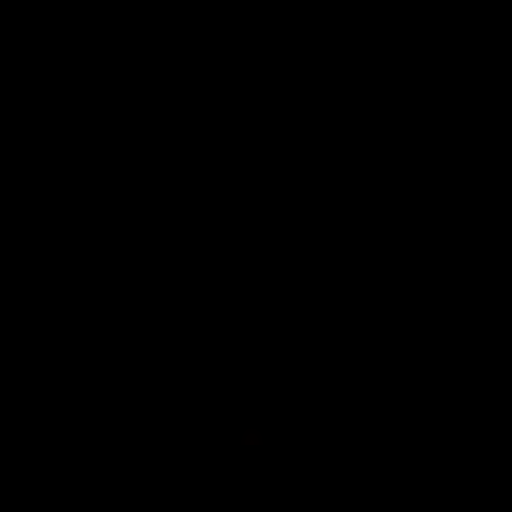
[im 22/44]
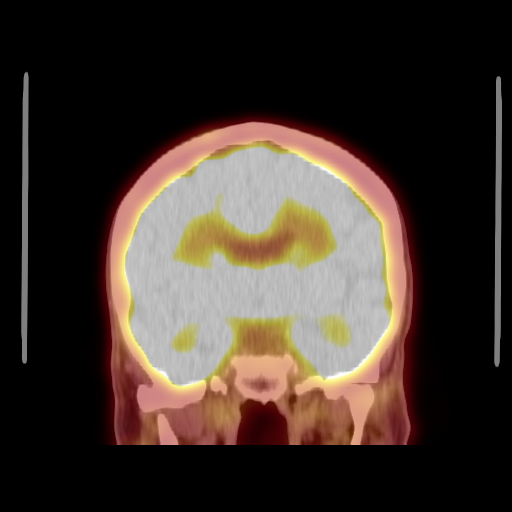
[im 44/44]
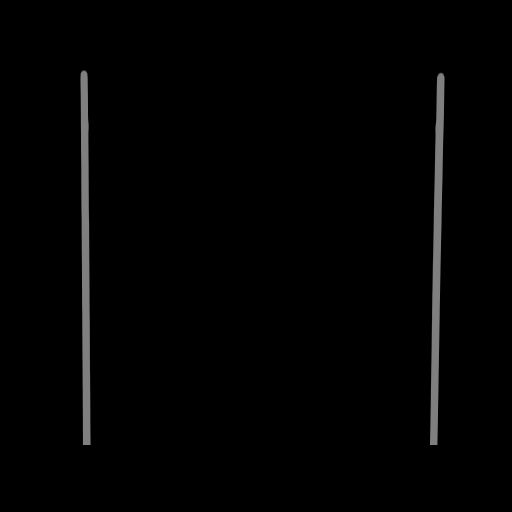

[Series 604: mip range 3 · coronal · 1.68mm/px · 2 of 32 slices shown]
[im 1/32]
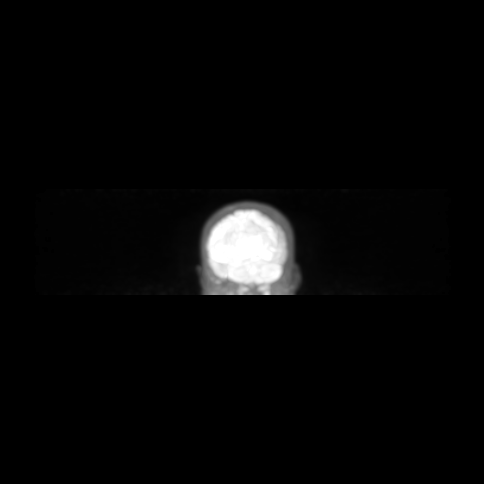
[im 32/32]
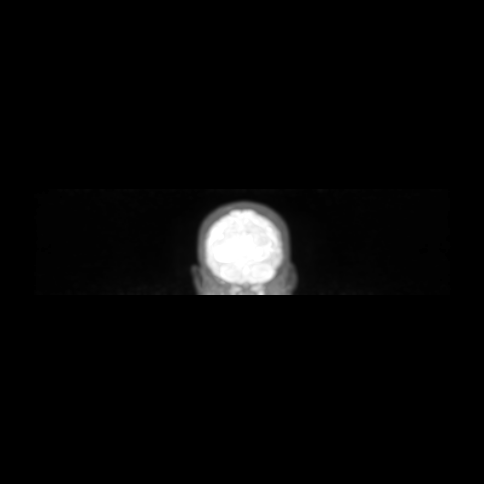

[Series 605: range-ct brain 3.0 (id)<alpha range> · 3 of 52 slices shown (2 of 2)]
[im 1/52]
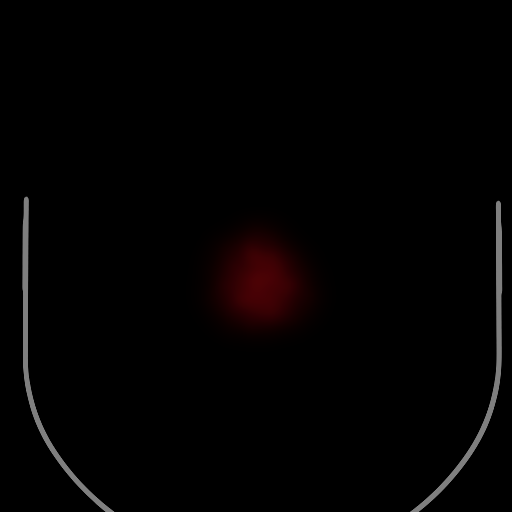
[im 26/52]
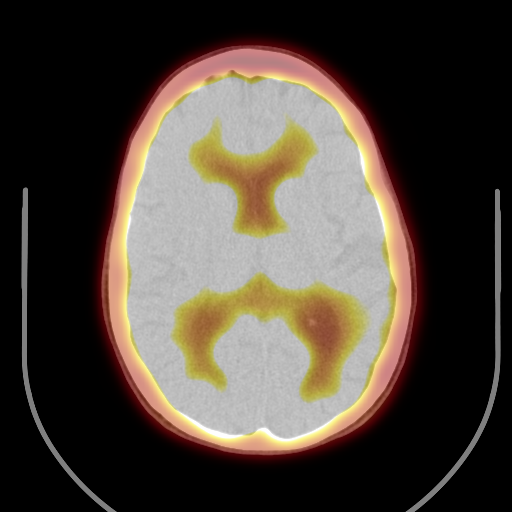
[im 52/52]
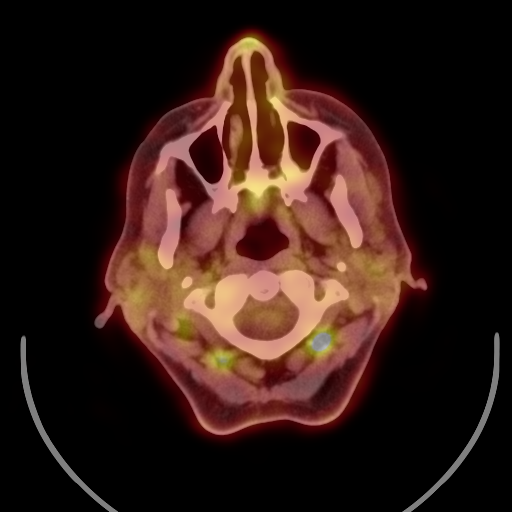

[25 of 25 positions shown; findings below may reference images not displayed]

FINDINGS: Bilateral and symmetric decreased tracer uptake within the parietal
lobes with relatively normal uptake within the remaining cortices.
On the CT portion of the exam there is a relative normal brain
morphology. No significant white matter disease. No areas of
encephalomalacia to suggest prior infarct.
IMPRESSION: 1. Symmetric decreased radiotracer activity within bilateral
parietal lobes compatible with a pattern seen with Alzheimer's
dementia.

## 2020-12-20 NOTE — Progress Notes (Addendum)
Chief Complaint  Patient presents with   Follow-up    Rm 1, w daughter in law. Here for 6 month Alzheimer f/u. Daughter in law reports more difficulty with motor skills.      HISTORY OF PRESENT ILLNESS: 12/26/20 ALL: Donna Jacobs returns for follow up for early onset AD. She continues donepezil 67m daily and memantine 510mBID. She is doing fairly well. No significant changes. She does have more difficulty getting into her son's truck. She doesn't remember what foot to put on the step first. She continues to be mostly independent with ADLs. She does not drink much water, loves Dr PeMalachi BondsAppetite is good. She is less active. Mood is good. She lives with her husband. She does not drive. BP is normally good. They have not checked at home but reports normal readings at PCP visits. She is asymptomatic today.   06/14/2020 ALL: AnElasiaeturns for follow up for early onset AD. She continues Aricept 1020maily. Namenda 5mg84mD was added at last follow up in 12/2019. Mood has been much better. She is less irritable. She continues to live with her husband, Donna Jacobs. Medications are given by her family. She is eating and sleeping well. No changes in gait. No falls. She is not as active as she used to be. She loves to sit and watch TV. She likes to drink Dr PeppMalachi Bonds 01/26/2020 ALL:  Donna Jacobs 60 y81. female here today for follow up for early onset AD. Overall, she seems to be about the same since last being seen. Her daughter in law presents with her today and aids in history. She reports that around September, she noticed that she seemed more irritable and depressed. No previous history of depression. No concerns of SI/HI. She was a very active person when healthy. Now she is not as active and gets very frustrated when she can't go out, shop or visit with friends. She does not seem to understand why she is not able to go out.  She lives with her husband. She does  not drive. Donna Jacobs medications and prepares all foods. Donna HoltKapuscinskiable to perform most other Jacobs independently.    HISTORY (copied from previous note)  Donna Jacobs 59 y51r old right-handed woman with an underlying medical history of vitamin D deficiency, hyperlipidemia, arthritis, former smoking and obesity, who presents for follow-up consultation of her memory loss of 3+ years duration.  The patient is accompanied by her daughter-in-law, Donna Jacobs, today.  I last saw her on 06/18/2019, at which time we talked about her recent test results including her MRI and neuropsychological evaluation.  She was agreeable to pursuing a PET scan.  She was advised to start Aricept 10 mg strength half a pill with increase to 1 pill after about a month.  She had an interim brain metabolic PET scan on 6/155/42/7062 I reviewed the results: IMPRESSION: 1. Symmetric decreased radiotracer activity within bilateral parietal lobes compatible with a pattern seen with Alzheimer's dementia.   Her mother was contacted with her test results.   Today, 09/21/2019: She reports doing fairly well.  Sometimes she feels her donepezil was making her tired and her eyelids become heavy.  She has a routine eye examination pending for later this year in November.  She has not fallen asleep inadvertently during the day.  In the first 2 weeks after starting the full dose of donepezil she was notably  irritable per daughter-in-law.  She had some anger type reactions that were not like her.  It did help to take a break and go to the beach with the family.  She did well with that.  She has had some intermittent diarrhea but has a history of having diarrhea in the past, she saw GI in the past, has not seen a GI specialist as of late.  All in all, she is able to tolerate the donepezil and she has not had any mood irritability lately.  She is interested in watching TV.  She does not do a whole lot in terms of other activities.  She has not been  able to exercise on a regular basis, walking outside has been possible intermittently weather permitting.  She has increased her water intake and decreased her soda intake.  She has not fallen recently.  She has seen rheumatology and had significant amount of blood work per daughter-in-law and was told to start vitamin D.  She has been using a gummy type multivitamin, no separate vitamin D.  They are not sure how much vitamin D is in it.     The patient's allergies, current medications, family history, past medical history, past social history, past surgical history and problem list were reviewed and updated as appropriate.    Previously:      I first met her on 12/16/2018 at the request of her primary care, at which time the patient reported a history of memory loss, she had been accompanied by her friend at the time.  She had recently had blood work through her primary care and I suggested further work-up in the form of brain MRI and also seeking consultation with neuropsychology.   She had a brain MRI without contrast on 12/30/2018 and I reviewed the results: :    MRI brain (without) demonstrating: - Mild periventricular and subcortical foci of T2 hyperintensities. These findings are non-specific and considerations include autoimmune, inflammatory, post-infectious, microvascular ischemic or migraine associated etiologies.  - No acute findings.   She had consultation, evaluation and feedback appointment with Dr. Melvyn Novas, neuropsychologist at [facility] neurology and I reviewed his impression and recommendations.      His evaluation was supportive of early onset Alzheimer's disease.  Further evaluation was recommended in the form of PET scan or CSF evaluation.  Medication recommendations included anticholinergic dementia medication or Namenda.     12/16/18: (She) reports memory loss for the past 3+ years.  The patient has had forgetfulness and often misplaces things.  She has been driving.  Her friend  has been able to observe her driving and feels that she has done well with local and familiar routes.  However, patient does report that she has gotten lost driving at times.  She continues to work.  She works for [employer], [employer], and has worked for the same company for over 30 years. She has had some mood issues including frustration, her husband has also been frustrated with her as I understand.  She lives with her husband, she has 2 grown sons.  She has 3 grandsons.  She quit smoking over 20 years ago.  She has a remote history of drug abuse and was in rehab when she was a teenager or young adults.  She admits to trying cocaine but denies any IV drug use, she denies any recent drug use or relapse, she has smoked marijuana as well.  She does not hydrate very well with water she admits.  She likes  to drink Diet Coke.  She is not sure how many cans she drinks per day.  She does not currently drink any alcohol.  She denies any heavy alcohol use or alcohol abuse in the past.  She does not snore.  Donna Jacobs has not noticed any gasping sounds or snoring sounds when they have shared a room before on a trip. She reports that her paternal grandmother had Alzheimer's disease.  Paternal aunt had no dementia.  The patient is the oldest of 18, she has a brother and a sister, neither 1 have memory loss.  I reviewed your office note from 10/27/2018.  She was referred to rheumatology at the time for a presumed diagnosis of rheumatoid arthritis.  She was also referred to orthopedics for leg length discrepancy.  She had blood work at the time including CBC with differential, B12, TSH, vitamin D.  Her B12 level was 257, vitamin D Jacobs at 21.9, TSH was normal, CBC with differential was unremarkable.  She is followed by rheumatology for her arthritis and prior diagnosis of rheumatoid arthritis.   REVIEW OF SYSTEMS: Out of a complete 14 system review of symptoms, the patient complains only of the following symptoms, memory loss,  and all other reviewed systems are negative.   ALLERGIES: Allergies  Allergen Reactions   Lipitor [Atorvastatin Calcium]    Codeine Nausea And Vomiting     HOME MEDICATIONS: Outpatient Medications Prior to Visit  Medication Sig Dispense Refill   donepezil (ARICEPT) 10 MG tablet TAKE 1 TABLET BY MOUTH AT BEDTIME 90 tablet 1   ezetimibe (ZETIA) 10 MG tablet Take 10 mg by mouth daily.     memantine (NAMENDA) 5 MG tablet Take 1 tablet (5 mg total) by mouth 2 (two) times daily. 180 tablet 1   Multiple Vitamins-Minerals (MULTIVITAMIN WOMEN) TABS Take 1 tablet by mouth daily in the afternoon.     No facility-administered medications prior to visit.     PAST MEDICAL HISTORY: Past Medical History:  Diagnosis Date   History of substance abuse    Briefly spent time in substance abuse rehabilitation treatment as a teenager. Cocaine likely represented the primary drug of abuse.    Hyperlipidemia    "I used to take med. for cholesteol, but only for a short time"   Major neurocognitive disorder 05/28/2019   Menopausal syndrome    Rheumatoid arthritis    Lumbar - spondylisthesis   S/P lumbar spinal fusion 08/15/2016   Vitamin D deficiency      PAST SURGICAL HISTORY: Past Surgical History:  Procedure Laterality Date   ABDOMINAL HYSTERECTOMY     TUBAL LIGATION     VAGINAL DELIVERY     x2     FAMILY HISTORY: Family History  Problem Relation Age of Onset   Alzheimer's disease Paternal Grandmother      SOCIAL HISTORY: Social History   Socioeconomic History   Marital status: Married    Spouse name: Not on file   Number of children: Not on file   Years of education: 12   Highest education level: High school graduate  Occupational History   Occupation: Retired    Comment: Designer, multimedia at [employer] Use   Smoking status: Former    Types: Cigarettes    Quit date: 08/10/1975    Years since quitting: 45.4   Smokeless tobacco: Never  Substance and Sexual Activity    Alcohol use: Yes    Comment: very rarely   Drug use: Not Currently  Types: Cocaine, Marijuana   Sexual activity: Not on file  Other Topics Concern   Not on file  Social History Narrative   Not on file   Social Determinants of Health   Financial Resource Strain: Not on file  Food Insecurity: Not on file  Transportation Needs: Not on file  Physical Activity: Not on file  Stress: Not on file  Social Connections: Not on file  Intimate Partner Violence: Not on file      PHYSICAL EXAM  Vitals:   12/26/20 0812  BP: (!) 167/101  Pulse: 92  Weight: 201 lb (91.2 kg)  Height: 5' 5"  (1.651 m)   Body mass index is 33.45 kg/m.   Generalized: Well developed, in no acute distress  Cardiology: normal rate and rhythm, no murmur auscultated  Respiratory: clear to auscultation bilaterally    Neurological examination  Mentation: Alert, she is not oriented to time, but is oriented to place and some history taking. Follows all commands with repeated instruction, speech and language fluent Cranial nerve II-XII: Pupils were equal round reactive to light. Extraocular movements were full, visual field were full on confrontational test. Facial sensation and strength were normal. Uvula tongue midline. Head turning and shoulder shrug  were normal and symmetric. Motor: The motor testing reveals 5 over 5 strength of all 4 extremities. Good symmetric motor tone is noted throughout.  Sensory: Sensory testing is intact to soft touch on all 4 extremities. No evidence of extinction is noted.  Coordination: Cerebellar testing reveals good finger-nose-finger and heel-to-shin bilaterally.  Gait and station: Gait is arthritic    DIAGNOSTIC DATA (LABS, IMAGING, TESTING) - I reviewed patient records, labs, notes, testing and imaging myself where available.  Lab Results  Component Value Date   WBC 7.1 08/09/2016   HGB 13.9 08/09/2016   HCT 41.4 08/09/2016   MCV 88.1 08/09/2016   PLT 229 08/09/2016       Component Value Date/Time   NA 140 08/09/2016 1526   K 3.7 08/09/2016 1526   CL 106 08/09/2016 1526   CO2 25 08/09/2016 1526   GLUCOSE 93 08/09/2016 1526   BUN 10 08/09/2016 1526   CREATININE 0.71 08/09/2016 1526   CALCIUM 9.0 08/09/2016 1526   GFRNONAA >60 08/09/2016 1526   GFRAA >60 08/09/2016 1526   No results found for: CHOL, HDL, LDLCALC, LDLDIRECT, TRIG, CHOLHDL No results found for: HGBA1C No results found for: VITAMINB12 No results found for: TSH  MMSE - Mini Mental State Exam 12/26/2020 06/14/2020 01/26/2020  Not completed: Unable to complete - -  Orientation to time - 1 0  Orientation to Place - 3 3  Registration - 1 3  Attention/ Calculation - 0 0  Recall - 0 0  Language- name 2 objects - 1 2  Language- repeat - 1 1  Language- follow 3 step command - 1 2  Language- read & follow direction - 0 1  Write a sentence - 0 0  Copy design - 0 0  Copy design-comments - - 3 animals  Total score - 8 12     ASSESSMENT AND PLAN  60 y.o. year old female  has a past medical history of History of substance abuse, Hyperlipidemia, Major neurocognitive disorder (05/28/2019), Menopausal syndrome, Rheumatoid arthritis, S/P lumbar spinal fusion (08/15/2016), and Vitamin D deficiency. here with   Early onset Alzheimer's dementia without behavioral disturbance (Nellie)  Nyx is doing well, today. Memory is fairly stable. We were unable to complete MMSE, today, due to anxiety  with questioning. She has tolerated donepezil 94m daily and memantine 537mtwice daily. We will continue donepezil 1050maily and increase memantine to 88m4mD. Possible side effects reviewed.  I have educated the family on the progressive nature of Alzheimer's the disease. We can consider PT/OT if she continues to have difficulty with motor skills. I have encouraged her to stay as active as possible.  Healthy lifestyle habits reviewed.  Memory compensation strategies encouraged. She was encouraged to keep a  close eye on BP at home. She will notify PCP if readings are > 140/90 consistently. She will follow-up in 6 months, sooner if needed.  She and her daughter-in-law both verbalized understanding and agreement with this plan.  Meds ordered this encounter  Medications   memantine (NAMENDA) 10 MG tablet    Sig: Take 1 tablet (10 mg total) by mouth 2 (two) times daily.    Dispense:  180 tablet    Refill:  3    Order Specific Question:   Supervising Provider    Answer:   AHERMelvenia Beam0[[id number]]  Lakena Sparlin Debbora PrestoN, FNP-C 12/26/2020, 9:06 AM  [facility] [address] [id number]

## 2020-12-20 NOTE — Patient Instructions (Signed)
Below is our plan:  We will continue donepezil 10mg  daily. I will increase mematine to 10mg  twice daily. Take 5mg  in the morning and 10mg  at bedtime for 1 week then increase dose to 10mg  twice daily.   Please make sure you are staying well hydrated. I recommend 50-60 ounces daily. Well balanced diet and regular exercise encouraged. Consistent sleep schedule with 6-8 hours recommended.   Please continue follow up with care team as directed.   Follow up with me in 6 months  You may receive a survey regarding today's visit. I encourage you to leave honest feed back as I do use this information to improve patient care. Thank you for seeing me today!     Management of Memory Problems   There are some general things you can do to help manage your memory problems.  Your memory may not in fact recover, but by using techniques and strategies you will be able to manage your memory difficulties better.   1)  Establish a routine. Try to establish and then stick to a regular routine.  By doing this, you will get used to what to expect and you will reduce the need to rely on your memory.  Also, try to do things at the same time of day, such as taking your medication or checking your calendar first thing in the morning. Think about think that you can do as a part of a regular routine and make a list.  Then enter them into a daily planner to remind you.  This will help you establish a routine.   2)  Organize your environment. Organize your environment so that it is uncluttered.  Decrease visual stimulation.  Place everyday items such as keys or cell phone in the same place every day (ie.  Basket next to front door) Use post it notes with a brief message to yourself (ie. Turn off light, lock the door) Use labels to indicate where things go (ie. Which cupboards are for food, dishes, etc.) Keep a notepad and pen by the telephone to take messages   3)  Memory Aids A diary or journal/notebook/daily  planner Making a list (shopping list, chore list, to do list that needs to be done) Using an alarm as a reminder (kitchen timer or cell phone alarm) Using cell phone to store information (Notes, Calendar, Reminders) Calendar/White board placed in a prominent position Post-it notes   In order for memory aids to be useful, you need to have good habits.  It's no good remembering to make a note in your journal if you don't remember to look in it.  Try setting aside a certain time of day to look in journal.   4)  Improving mood and managing fatigue. There may be other factors that contribute to memory difficulties.  Factors, such as anxiety, depression and tiredness can affect memory. Regular gentle exercise can help improve your mood and give you more energy. Simple relaxation techniques may help relieve symptoms of anxiety Try to get back to completing activities or hobbies you enjoyed doing in the past. Learn to pace yourself through activities to decrease fatigue. Find out about some local support groups where you can share experiences with others. Try and achieve 7-8 hours of sleep at night.

## 2020-12-26 ENCOUNTER — Encounter: Payer: Self-pay | Admitting: Family Medicine

## 2020-12-26 ENCOUNTER — Ambulatory Visit: Payer: 59 | Admitting: Family Medicine

## 2020-12-26 VITALS — BP 167/101 | HR 92 | Ht 65.0 in | Wt 201.0 lb

## 2020-12-26 DIAGNOSIS — G300 Alzheimer's disease with early onset: Secondary | ICD-10-CM

## 2020-12-26 DIAGNOSIS — F0280 Dementia in other diseases classified elsewhere without behavioral disturbance: Secondary | ICD-10-CM

## 2020-12-26 MED ORDER — MEMANTINE HCL 10 MG PO TABS: 10.0000 mg | ORAL_TABLET | Freq: Two times a day (BID) | ORAL | 3 refills | Status: DC

## 2021-03-14 ENCOUNTER — Other Ambulatory Visit: Payer: Self-pay | Admitting: Neurology

## 2021-03-14 ENCOUNTER — Telehealth: Payer: Self-pay | Admitting: Family Medicine

## 2021-03-14 MED ORDER — DONEPEZIL HCL 10 MG PO TABS: ORAL_TABLET | ORAL | 1 refills | Status: DC

## 2021-03-14 NOTE — Telephone Encounter (Signed)
Pt's daughter-in-law, Winter Brummitt request refill for donepezil (ARICEPT) 10 MG tablet at [facility] [phone]

## 2021-03-14 NOTE — Telephone Encounter (Signed)
Refill has been sent for the pt 

## 2021-03-14 NOTE — Telephone Encounter (Signed)
Refill has been sent.  °

## 2021-06-20 ENCOUNTER — Encounter: Payer: Self-pay | Admitting: Family Medicine

## 2021-06-20 NOTE — Telephone Encounter (Signed)
error 

## 2021-09-12 ENCOUNTER — Other Ambulatory Visit: Payer: Self-pay

## 2021-09-12 MED ORDER — DONEPEZIL HCL 10 MG PO TABS: ORAL_TABLET | ORAL | 0 refills | Status: DC

## 2021-10-09 DIAGNOSIS — Z23 Encounter for immunization: Secondary | ICD-10-CM | POA: Diagnosis not present

## 2021-10-09 DIAGNOSIS — E7800 Pure hypercholesterolemia, unspecified: Secondary | ICD-10-CM | POA: Diagnosis not present

## 2021-10-09 DIAGNOSIS — Z6835 Body mass index (BMI) 35.0-35.9, adult: Secondary | ICD-10-CM | POA: Diagnosis not present

## 2021-10-09 DIAGNOSIS — E559 Vitamin D deficiency, unspecified: Secondary | ICD-10-CM | POA: Diagnosis not present

## 2021-10-09 DIAGNOSIS — E039 Hypothyroidism, unspecified: Secondary | ICD-10-CM | POA: Diagnosis not present

## 2021-10-24 NOTE — Progress Notes (Unsigned)
No chief complaint on file.   HISTORY OF PRESENT ILLNESS:  10/24/21 ALL: Donna Jacobs returns for follow up for early onset dementia. She continues donepezil 59m daily and memantine 129mBID.   12/26/2020 ALL: AnDylanaeturns for follow up for early onset AD. She continues donepezil 105maily and memantine 5mg22mD. She is doing fairly well. No significant changes. She does have more difficulty getting into her son's truck. She doesn't remember what foot to put on the step first. She continues to be mostly independent with ADLs. She does not drink much water, loves Dr PeppMalachi Bondspetite is good. She is less active. Mood is good. She lives with her husband. She does not drive. BP is normally good. They have not checked at home but reports normal readings at PCP visits. She is asymptomatic today.   06/14/2020 ALL: [name] for follow up for early onset AD. She continues Aricept 10mg4mly. Namenda 5mg B74mwas added at last follow up in 12/2019. Mood has been much better. She is less irritable. She continues to live with her husband, Terry.Donna Markrequires some assistance with ADL's. Medications are given by her family. She is eating and sleeping well. No changes in gait. No falls. She is not as active as she used to be. She loves to sit and watch TV. She likes to drink Dr PepperMalachi Bonds/28/2021 ALL:  Donna LUCKING61 y.o49female here today for follow up for early onset AD. Overall, she seems to be about the same since last being seen. Her daughter in law presents with her today and aids in history. She reports that around September, she noticed that she seemed more irritable and depressed. No previous history of depression. No concerns of SI/HI. She was a very active person when healthy. Now she is not as active and gets very frustrated when she can't go out, shop or visit with friends. She does not seem to understand why she is not able to go out.  She lives with her husband. She does not drive. Mr  Edman gLindell medications and prepares all foods. Donna Bogdan iDresnerle to perform most other ADL's independently.    HISTORY (copied from Dr Athar'Guadelupe Sabinous note)  Donna Jacobs iFriedland59 yea74old right-handed woman with an underlying medical history of vitamin D deficiency, hyperlipidemia, arthritis, former smoking and obesity, who presents for follow-up consultation of her memory loss of 3+ years duration.  The patient is accompanied by her daughter-in-law, Donna Jacobs, today.  I last saw her on 06/18/2019, at which time we talked about her recent test results including her MRI and neuropsychological evaluation.  She was agreeable to pursuing a PET scan.  She was advised to start Aricept 10 mg strength half a pill with increase to 1 pill after about a month.  She had an interim brain metabolic PET scan on 07/13/25/03/6376 reviewed the results: IMPRESSION: 1. Symmetric decreased radiotracer activity within bilateral parietal lobes compatible with a pattern seen with Alzheimer's dementia.   Her mother was contacted with her test results.   Today, 09/21/2019: She reports doing fairly well.  Sometimes she feels her donepezil was making her tired and her eyelids become heavy.  She has a routine eye examination pending for later this year in November.  She has not fallen asleep inadvertently during the day.  In the first 2 weeks after starting the full dose of donepezil she was notably irritable per daughter-in-law.  She had some  anger type reactions that were not like her.  It did help to take a break and go to the beach with the family.  She did well with that.  She has had some intermittent diarrhea but has a history of having diarrhea in the past, she saw GI in the past, has not seen a GI specialist as of late.  All in all, she is able to tolerate the donepezil and she has not had any mood irritability lately.  She is interested in watching TV.  She does not do a whole lot in terms of other activities.  She has not been  able to exercise on a regular basis, walking outside has been possible intermittently weather permitting.  She has increased her water intake and decreased her soda intake.  She has not fallen recently.  She has seen rheumatology and had significant amount of blood work per daughter-in-law and was told to start vitamin D.  She has been using a gummy type multivitamin, no separate vitamin D.  They are not sure how much vitamin D is in it.     The patient's allergies, current medications, family history, past medical history, past social history, past surgical history and problem list were reviewed and updated as appropriate.    Previously:      I first met her on 12/16/2018 at the request of her primary care, at which time the patient reported a history of memory loss, she had been accompanied by her friend at the time.  She had recently had blood work through her primary care and I suggested further work-up in the form of brain MRI and also seeking consultation with neuropsychology.   She had a brain MRI without contrast on 12/30/2018 and I reviewed the results: :    MRI brain (without) demonstrating: - Mild periventricular and subcortical foci of T2 hyperintensities. These findings are non-specific and considerations include autoimmune, inflammatory, post-infectious, microvascular ischemic or migraine associated etiologies.  - No acute findings.   She had consultation, evaluation and feedback appointment with Dr. Merz, neuropsychologist at [facility] and I reviewed his impression and recommendations.      His evaluation was supportive of early onset Alzheimer's disease.  Further evaluation was recommended in the form of PET scan or CSF evaluation.  Medication recommendations included anticholinergic dementia medication or Namenda.     12/16/18: (She) reports memory loss for the past 3+ years.  The patient has had forgetfulness and often misplaces things.  She has been driving.  Her friend  has been able to observe her driving and feels that she has done well with local and familiar routes.  However, patient does report that she has gotten lost driving at times.  She continues to work.  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 She has had some mood issues including frustration, her husband has also been frustrated with her as I understand.  She lives with her husband, she has 2 grown sons.  She has 3 grandsons.  She quit smoking over 20 years ago.  She has a remote history of drug abuse and was in rehab when she was a teenager or young adults.  She admits to trying cocaine but denies any IV drug use, she denies any recent drug use or relapse, she has smoked marijuana as well.  She does not hydrate very well with water she admits.  She likes to drink Diet Coke.  She is   not sure how many cans she drinks per day.  She does not currently drink any alcohol.  She denies any heavy alcohol use or alcohol abuse in the past.  She does not snore.  Christy has not noticed any gasping sounds or snoring sounds when they have shared a room before on a trip. She reports that her paternal grandmother had Alzheimer's disease.  Paternal aunt had no dementia.  The patient is the oldest of 3, she has a brother and a sister, neither 1 have memory loss.  I reviewed your office note from 10/27/2018.  She was referred to rheumatology at the time for a presumed diagnosis of rheumatoid arthritis.  She was also referred to orthopedics for leg length discrepancy.  She had blood work at the time including CBC with differential, B12, TSH, vitamin D.  Her B12 level was 257, vitamin D low at 21.9, TSH was normal, CBC with differential was unremarkable.  She is followed by rheumatology for her arthritis and prior diagnosis of rheumatoid arthritis.   REVIEW OF SYSTEMS: Out of a complete 14 system review of symptoms, the patient complains only of the following symptoms, memory loss,  and all other reviewed systems are negative.   ALLERGIES: Allergies  Allergen Reactions   Lipitor [Atorvastatin Calcium]    Codeine Nausea And Vomiting     HOME MEDICATIONS: Outpatient Medications Prior to Visit  Medication Sig Dispense Refill   donepezil (ARICEPT) 10 MG tablet TAKE 1 TABLET BY MOUTH AT BEDTIME 90 tablet 0   ezetimibe (ZETIA) 10 MG tablet Take 10 mg by mouth daily.     memantine (NAMENDA) 10 MG tablet Take 1 tablet (10 mg total) by mouth 2 (two) times daily. 180 tablet 3   memantine (NAMENDA) 5 MG tablet Take 1 tablet (5 mg total) by mouth 2 (two) times daily. 180 tablet 1   Multiple Vitamins-Minerals (MULTIVITAMIN WOMEN) TABS Take 1 tablet by mouth daily in the afternoon.     No facility-administered medications prior to visit.     PAST MEDICAL HISTORY: Past Medical History:  Diagnosis Date   History of substance abuse    Briefly spent time in substance abuse rehabilitation treatment as a teenager. Cocaine likely represented the primary drug of abuse.    Hyperlipidemia    "I used to take med. for cholesteol, but only for a short time"   Major neurocognitive disorder 05/28/2019   Menopausal syndrome    Rheumatoid arthritis    Lumbar - spondylisthesis   S/P lumbar spinal fusion 08/15/2016   Vitamin D deficiency      PAST SURGICAL HISTORY: Past Surgical History:  Procedure Laterality Date   ABDOMINAL HYSTERECTOMY     TUBAL LIGATION     VAGINAL DELIVERY     x2     FAMILY HISTORY: Family History  Problem Relation Age of Onset   Alzheimer's disease Paternal Grandmother      SOCIAL HISTORY: Social History   Socioeconomic History   Marital status: Married    Spouse name: Not on file   Number of children: Not on file   Years of education: 12   Highest education level: High school graduate  Occupational History   Occupation: Retired    Comment: Clothing packer at [employer]  Tobacco Use   Smoking status: Former    Types: Cigarettes    Quit date:  08/10/1975    Years since quitting: 46.2   Smokeless tobacco: Never  Substance and Sexual Activity   Alcohol use: Yes      Comment: very rarely   Drug use: Not Currently    Types: Cocaine, Marijuana   Sexual activity: Not on file  Other Topics Concern   Not on file  Social History Narrative   Not on file   Social Determinants of Health   Financial Resource Strain: Not on file  Food Insecurity: Not on file  Transportation Needs: Not on file  Physical Activity: Not on file  Stress: Not on file  Social Connections: Not on file  Intimate Partner Violence: Not on file      PHYSICAL EXAM  There were no vitals filed for this visit.  There is no height or weight on file to calculate BMI.   Generalized: Well developed, in no acute distress  Cardiology: normal rate and rhythm, no murmur auscultated  Respiratory: clear to auscultation bilaterally    Neurological examination  Mentation: Alert, she is not oriented to time, but is oriented to place and some history taking. Follows all commands with repeated instruction, speech and language fluent Cranial nerve II-XII: Pupils were equal round reactive to light. Extraocular movements were full, visual field were full on confrontational test. Facial sensation and strength were normal. Uvula tongue midline. Head turning and shoulder shrug  were normal and symmetric. Motor: The motor testing reveals 5 over 5 strength of all 4 extremities. Good symmetric motor tone is noted throughout.  Sensory: Sensory testing is intact to soft touch on all 4 extremities. No evidence of extinction is noted.  Coordination: Cerebellar testing reveals good finger-nose-finger and heel-to-shin bilaterally.  Gait and station: Gait is arthritic    DIAGNOSTIC DATA (LABS, IMAGING, TESTING) - I reviewed patient records, labs, notes, testing and imaging myself where available.  Lab Results  Component Value Date   WBC 7.1 08/09/2016   HGB 13.9 08/09/2016   HCT  41.4 08/09/2016   MCV 88.1 08/09/2016   PLT 229 08/09/2016      Component Value Date/Time   NA 140 08/09/2016 1526   K 3.7 08/09/2016 1526   CL 106 08/09/2016 1526   CO2 25 08/09/2016 1526   GLUCOSE 93 08/09/2016 1526   BUN 10 08/09/2016 1526   CREATININE 0.71 08/09/2016 1526   CALCIUM 9.0 08/09/2016 1526   GFRNONAA >60 08/09/2016 1526   GFRAA >60 08/09/2016 1526   No results found for: "CHOL", "HDL", "LDLCALC", "LDLDIRECT", "TRIG", "CHOLHDL" No results found for: "HGBA1C" No results found for: "VITAMINB12" No results found for: "TSH"     12/26/2020    9:00 AM 06/14/2020    9:53 AM 01/26/2020    9:26 AM  MMSE - Mini Mental State Exam  Not completed: Unable to complete    Orientation to time  1 0  Orientation to Place  3 3  Registration  1 3  Attention/ Calculation  0 0  Recall  0 0  Language- name 2 objects  1 2  Language- repeat  1 1  Language- follow 3 step command  1 2  Language- read & follow direction  0 1  Write a sentence  0 0  Copy design  0 0  Copy design-comments   3 animals  Total score  8 12     ASSESSMENT AND PLAN  61 y.o. year old female  has a past medical history of History of substance abuse, Hyperlipidemia, Major neurocognitive disorder (05/28/2019), Menopausal syndrome, Rheumatoid arthritis, S/P lumbar spinal fusion (08/15/2016), and Vitamin D deficiency. here with   No diagnosis found.  Deseray is doing well, today.   Memory is fairly stable. We were unable to complete MMSE, today, due to anxiety with questioning. She has tolerated donepezil 49m daily and memantine 593mtwice daily. We will continue donepezil 1030maily and increase memantine to 67m15mD. Possible side effects reviewed.  I have educated the family on the progressive nature of Alzheimer's the disease. We can consider PT/OT if she continues to have difficulty with motor skills. I have encouraged her to stay as active as possible.  Healthy lifestyle habits reviewed.  Memory compensation  strategies encouraged. She was encouraged to keep a close eye on BP at home. She will notify PCP if readings are > 140/90 consistently. She will follow-up in 6 months, sooner if needed.  She and her daughter-in-law both verbalized understanding and agreement with this plan.  No orders of the defined types were placed in this encounter.     Kaevion Sinclair Debbora PrestoN, FNP-C 10/24/2021, 3:36 PM  [facility] [address] StreetitSellersburgeMutual [phone]

## 2021-10-24 NOTE — Patient Instructions (Signed)
Below is our plan:  We will continue donepezil 10mg  daily and memantine 10mg  twice daily.   Please make sure you are staying well hydrated. I recommend 50-60 ounces daily. Well balanced diet and regular exercise encouraged. Consistent sleep schedule with 6-8 hours recommended.   Please continue follow up with care team as directed.   Follow up with me in 1 year   You may receive a survey regarding today's visit. I encourage you to leave honest feed back as I do use this information to improve patient care. Thank you for seeing me today!   Management of Memory Problems   There are some general things you can do to help manage your memory problems.  Your memory may not in fact recover, but by using techniques and strategies you will be able to manage your memory difficulties better.   1)  Establish a routine. Try to establish and then stick to a regular routine.  By doing this, you will get used to what to expect and you will reduce the need to rely on your memory.  Also, try to do things at the same time of day, such as taking your medication or checking your calendar first thing in the morning. Think about think that you can do as a part of a regular routine and make a list.  Then enter them into a daily planner to remind you.  This will help you establish a routine.   2)  Organize your environment. Organize your environment so that it is uncluttered.  Decrease visual stimulation.  Place everyday items such as keys or cell phone in the same place every day (ie.  Basket next to front door) Use post it notes with a brief message to yourself (ie. Turn off light, lock the door) Use labels to indicate where things go (ie. Which cupboards are for food, dishes, etc.) Keep a notepad and pen by the telephone to take messages   3)  Memory Aids A diary or journal/notebook/daily planner Making a list (shopping list, chore list, to do list that needs to be done) Using an alarm as a reminder (kitchen  timer or cell phone alarm) Using cell phone to store information (Notes, Calendar, Reminders) Calendar/White board placed in a prominent position Post-it notes   In order for memory aids to be useful, you need to have good habits.  It's no good remembering to make a note in your journal if you don't remember to look in it.  Try setting aside a certain time of day to look in journal.   4)  Improving mood and managing fatigue. There may be other factors that contribute to memory difficulties.  Factors, such as anxiety, depression and tiredness can affect memory. Regular gentle exercise can help improve your mood and give you more energy. Simple relaxation techniques may help relieve symptoms of anxiety Try to get back to completing activities or hobbies you enjoyed doing in the past. Learn to pace yourself through activities to decrease fatigue. Find out about some local support groups where you can share experiences with others. Try and achieve 7-8 hours of sleep at night.

## 2021-10-25 ENCOUNTER — Ambulatory Visit: Payer: Medicare Other | Admitting: Family Medicine

## 2021-10-25 ENCOUNTER — Encounter: Payer: Self-pay | Admitting: Family Medicine

## 2021-10-25 VITALS — BP 130/96 | HR 83 | Ht 60.0 in | Wt 179.4 lb

## 2021-10-25 DIAGNOSIS — G300 Alzheimer's disease with early onset: Secondary | ICD-10-CM | POA: Diagnosis not present

## 2021-10-25 DIAGNOSIS — F0280 Dementia in other diseases classified elsewhere without behavioral disturbance: Secondary | ICD-10-CM

## 2021-10-25 MED ORDER — MEMANTINE HCL 10 MG PO TABS: 10.0000 mg | ORAL_TABLET | Freq: Two times a day (BID) | ORAL | 3 refills | Status: DC

## 2021-10-25 MED ORDER — DONEPEZIL HCL 10 MG PO TABS: ORAL_TABLET | ORAL | 3 refills | Status: DC

## 2021-12-12 ENCOUNTER — Other Ambulatory Visit: Payer: Self-pay

## 2021-12-12 MED ORDER — DONEPEZIL HCL 10 MG PO TABS: ORAL_TABLET | ORAL | 3 refills | Status: DC

## 2022-01-19 DIAGNOSIS — I10 Essential (primary) hypertension: Secondary | ICD-10-CM | POA: Diagnosis not present

## 2022-01-19 DIAGNOSIS — R413 Other amnesia: Secondary | ICD-10-CM | POA: Diagnosis not present

## 2022-01-19 DIAGNOSIS — E559 Vitamin D deficiency, unspecified: Secondary | ICD-10-CM | POA: Diagnosis not present

## 2022-01-19 DIAGNOSIS — E7800 Pure hypercholesterolemia, unspecified: Secondary | ICD-10-CM | POA: Diagnosis not present

## 2022-02-07 DIAGNOSIS — E7800 Pure hypercholesterolemia, unspecified: Secondary | ICD-10-CM | POA: Diagnosis not present

## 2022-02-07 DIAGNOSIS — Z604 Social exclusion and rejection: Secondary | ICD-10-CM | POA: Diagnosis not present

## 2022-02-07 DIAGNOSIS — M2170 Unequal limb length (acquired), unspecified site: Secondary | ICD-10-CM | POA: Diagnosis not present

## 2022-02-07 DIAGNOSIS — E039 Hypothyroidism, unspecified: Secondary | ICD-10-CM | POA: Diagnosis not present

## 2022-02-07 DIAGNOSIS — E559 Vitamin D deficiency, unspecified: Secondary | ICD-10-CM | POA: Diagnosis not present

## 2022-02-07 DIAGNOSIS — R413 Other amnesia: Secondary | ICD-10-CM | POA: Diagnosis not present

## 2022-02-07 DIAGNOSIS — Z8616 Personal history of COVID-19: Secondary | ICD-10-CM | POA: Diagnosis not present

## 2022-02-07 DIAGNOSIS — F0280 Dementia in other diseases classified elsewhere without behavioral disturbance: Secondary | ICD-10-CM | POA: Diagnosis not present

## 2022-02-07 DIAGNOSIS — G300 Alzheimer's disease with early onset: Secondary | ICD-10-CM | POA: Diagnosis not present

## 2022-02-07 DIAGNOSIS — Z87891 Personal history of nicotine dependence: Secondary | ICD-10-CM | POA: Diagnosis not present

## 2022-02-07 DIAGNOSIS — I10 Essential (primary) hypertension: Secondary | ICD-10-CM | POA: Diagnosis not present

## 2022-02-07 DIAGNOSIS — Z556 Problems related to health literacy: Secondary | ICD-10-CM | POA: Diagnosis not present

## 2022-02-13 DIAGNOSIS — G300 Alzheimer's disease with early onset: Secondary | ICD-10-CM | POA: Diagnosis not present

## 2022-02-13 DIAGNOSIS — R413 Other amnesia: Secondary | ICD-10-CM | POA: Diagnosis not present

## 2022-02-13 DIAGNOSIS — Z8616 Personal history of COVID-19: Secondary | ICD-10-CM | POA: Diagnosis not present

## 2022-02-13 DIAGNOSIS — Z604 Social exclusion and rejection: Secondary | ICD-10-CM | POA: Diagnosis not present

## 2022-02-13 DIAGNOSIS — I10 Essential (primary) hypertension: Secondary | ICD-10-CM | POA: Diagnosis not present

## 2022-02-13 DIAGNOSIS — M2170 Unequal limb length (acquired), unspecified site: Secondary | ICD-10-CM | POA: Diagnosis not present

## 2022-02-13 DIAGNOSIS — E039 Hypothyroidism, unspecified: Secondary | ICD-10-CM | POA: Diagnosis not present

## 2022-02-13 DIAGNOSIS — Z87891 Personal history of nicotine dependence: Secondary | ICD-10-CM | POA: Diagnosis not present

## 2022-02-13 DIAGNOSIS — F0280 Dementia in other diseases classified elsewhere without behavioral disturbance: Secondary | ICD-10-CM | POA: Diagnosis not present

## 2022-02-13 DIAGNOSIS — E7800 Pure hypercholesterolemia, unspecified: Secondary | ICD-10-CM | POA: Diagnosis not present

## 2022-02-13 DIAGNOSIS — Z556 Problems related to health literacy: Secondary | ICD-10-CM | POA: Diagnosis not present

## 2022-02-13 DIAGNOSIS — E559 Vitamin D deficiency, unspecified: Secondary | ICD-10-CM | POA: Diagnosis not present

## 2022-02-14 DIAGNOSIS — E7800 Pure hypercholesterolemia, unspecified: Secondary | ICD-10-CM | POA: Diagnosis not present

## 2022-02-14 DIAGNOSIS — E039 Hypothyroidism, unspecified: Secondary | ICD-10-CM | POA: Diagnosis not present

## 2022-02-14 DIAGNOSIS — Z8616 Personal history of COVID-19: Secondary | ICD-10-CM | POA: Diagnosis not present

## 2022-02-14 DIAGNOSIS — Z87891 Personal history of nicotine dependence: Secondary | ICD-10-CM | POA: Diagnosis not present

## 2022-02-14 DIAGNOSIS — F0280 Dementia in other diseases classified elsewhere without behavioral disturbance: Secondary | ICD-10-CM | POA: Diagnosis not present

## 2022-02-14 DIAGNOSIS — E559 Vitamin D deficiency, unspecified: Secondary | ICD-10-CM | POA: Diagnosis not present

## 2022-02-14 DIAGNOSIS — I10 Essential (primary) hypertension: Secondary | ICD-10-CM | POA: Diagnosis not present

## 2022-02-14 DIAGNOSIS — Z556 Problems related to health literacy: Secondary | ICD-10-CM | POA: Diagnosis not present

## 2022-02-14 DIAGNOSIS — M2170 Unequal limb length (acquired), unspecified site: Secondary | ICD-10-CM | POA: Diagnosis not present

## 2022-02-14 DIAGNOSIS — Z604 Social exclusion and rejection: Secondary | ICD-10-CM | POA: Diagnosis not present

## 2022-02-14 DIAGNOSIS — G300 Alzheimer's disease with early onset: Secondary | ICD-10-CM | POA: Diagnosis not present

## 2022-02-14 DIAGNOSIS — R413 Other amnesia: Secondary | ICD-10-CM | POA: Diagnosis not present

## 2022-02-15 DIAGNOSIS — E559 Vitamin D deficiency, unspecified: Secondary | ICD-10-CM | POA: Diagnosis not present

## 2022-02-15 DIAGNOSIS — M2170 Unequal limb length (acquired), unspecified site: Secondary | ICD-10-CM | POA: Diagnosis not present

## 2022-02-15 DIAGNOSIS — R413 Other amnesia: Secondary | ICD-10-CM | POA: Diagnosis not present

## 2022-02-15 DIAGNOSIS — F0280 Dementia in other diseases classified elsewhere without behavioral disturbance: Secondary | ICD-10-CM | POA: Diagnosis not present

## 2022-02-15 DIAGNOSIS — E039 Hypothyroidism, unspecified: Secondary | ICD-10-CM | POA: Diagnosis not present

## 2022-02-15 DIAGNOSIS — Z604 Social exclusion and rejection: Secondary | ICD-10-CM | POA: Diagnosis not present

## 2022-02-15 DIAGNOSIS — G300 Alzheimer's disease with early onset: Secondary | ICD-10-CM | POA: Diagnosis not present

## 2022-02-15 DIAGNOSIS — I10 Essential (primary) hypertension: Secondary | ICD-10-CM | POA: Diagnosis not present

## 2022-02-15 DIAGNOSIS — Z87891 Personal history of nicotine dependence: Secondary | ICD-10-CM | POA: Diagnosis not present

## 2022-02-15 DIAGNOSIS — Z556 Problems related to health literacy: Secondary | ICD-10-CM | POA: Diagnosis not present

## 2022-02-15 DIAGNOSIS — E7800 Pure hypercholesterolemia, unspecified: Secondary | ICD-10-CM | POA: Diagnosis not present

## 2022-02-15 DIAGNOSIS — Z8616 Personal history of COVID-19: Secondary | ICD-10-CM | POA: Diagnosis not present

## 2022-02-22 ENCOUNTER — Encounter: Payer: Self-pay | Admitting: Family Medicine

## 2022-02-22 DIAGNOSIS — Z604 Social exclusion and rejection: Secondary | ICD-10-CM | POA: Diagnosis not present

## 2022-02-22 DIAGNOSIS — F0280 Dementia in other diseases classified elsewhere without behavioral disturbance: Secondary | ICD-10-CM | POA: Diagnosis not present

## 2022-02-22 DIAGNOSIS — Z8616 Personal history of COVID-19: Secondary | ICD-10-CM | POA: Diagnosis not present

## 2022-02-22 DIAGNOSIS — E039 Hypothyroidism, unspecified: Secondary | ICD-10-CM | POA: Diagnosis not present

## 2022-02-22 DIAGNOSIS — Z556 Problems related to health literacy: Secondary | ICD-10-CM | POA: Diagnosis not present

## 2022-02-22 DIAGNOSIS — I10 Essential (primary) hypertension: Secondary | ICD-10-CM | POA: Diagnosis not present

## 2022-02-22 DIAGNOSIS — R413 Other amnesia: Secondary | ICD-10-CM | POA: Diagnosis not present

## 2022-02-22 DIAGNOSIS — E559 Vitamin D deficiency, unspecified: Secondary | ICD-10-CM | POA: Diagnosis not present

## 2022-02-22 DIAGNOSIS — G300 Alzheimer's disease with early onset: Secondary | ICD-10-CM | POA: Diagnosis not present

## 2022-02-22 DIAGNOSIS — M2170 Unequal limb length (acquired), unspecified site: Secondary | ICD-10-CM | POA: Diagnosis not present

## 2022-02-22 DIAGNOSIS — Z87891 Personal history of nicotine dependence: Secondary | ICD-10-CM | POA: Diagnosis not present

## 2022-02-22 DIAGNOSIS — E7800 Pure hypercholesterolemia, unspecified: Secondary | ICD-10-CM | POA: Diagnosis not present

## 2022-02-23 DIAGNOSIS — I10 Essential (primary) hypertension: Secondary | ICD-10-CM | POA: Diagnosis not present

## 2022-02-23 DIAGNOSIS — E7800 Pure hypercholesterolemia, unspecified: Secondary | ICD-10-CM | POA: Diagnosis not present

## 2022-02-23 DIAGNOSIS — Z87891 Personal history of nicotine dependence: Secondary | ICD-10-CM | POA: Diagnosis not present

## 2022-02-23 DIAGNOSIS — G300 Alzheimer's disease with early onset: Secondary | ICD-10-CM | POA: Diagnosis not present

## 2022-02-23 DIAGNOSIS — M2170 Unequal limb length (acquired), unspecified site: Secondary | ICD-10-CM | POA: Diagnosis not present

## 2022-02-23 DIAGNOSIS — F0280 Dementia in other diseases classified elsewhere without behavioral disturbance: Secondary | ICD-10-CM | POA: Diagnosis not present

## 2022-02-23 DIAGNOSIS — E559 Vitamin D deficiency, unspecified: Secondary | ICD-10-CM | POA: Diagnosis not present

## 2022-02-23 DIAGNOSIS — Z8616 Personal history of COVID-19: Secondary | ICD-10-CM | POA: Diagnosis not present

## 2022-02-23 DIAGNOSIS — Z604 Social exclusion and rejection: Secondary | ICD-10-CM | POA: Diagnosis not present

## 2022-02-23 DIAGNOSIS — R413 Other amnesia: Secondary | ICD-10-CM | POA: Diagnosis not present

## 2022-02-23 DIAGNOSIS — Z556 Problems related to health literacy: Secondary | ICD-10-CM | POA: Diagnosis not present

## 2022-02-23 DIAGNOSIS — E039 Hypothyroidism, unspecified: Secondary | ICD-10-CM | POA: Diagnosis not present

## 2022-02-27 DIAGNOSIS — E7800 Pure hypercholesterolemia, unspecified: Secondary | ICD-10-CM | POA: Diagnosis not present

## 2022-02-27 DIAGNOSIS — Z8616 Personal history of COVID-19: Secondary | ICD-10-CM | POA: Diagnosis not present

## 2022-02-27 DIAGNOSIS — F0280 Dementia in other diseases classified elsewhere without behavioral disturbance: Secondary | ICD-10-CM | POA: Diagnosis not present

## 2022-02-27 DIAGNOSIS — E559 Vitamin D deficiency, unspecified: Secondary | ICD-10-CM | POA: Diagnosis not present

## 2022-02-27 DIAGNOSIS — G300 Alzheimer's disease with early onset: Secondary | ICD-10-CM | POA: Diagnosis not present

## 2022-02-27 DIAGNOSIS — Z87891 Personal history of nicotine dependence: Secondary | ICD-10-CM | POA: Diagnosis not present

## 2022-02-27 DIAGNOSIS — R413 Other amnesia: Secondary | ICD-10-CM | POA: Diagnosis not present

## 2022-02-27 DIAGNOSIS — Z604 Social exclusion and rejection: Secondary | ICD-10-CM | POA: Diagnosis not present

## 2022-02-27 DIAGNOSIS — E039 Hypothyroidism, unspecified: Secondary | ICD-10-CM | POA: Diagnosis not present

## 2022-02-27 DIAGNOSIS — I10 Essential (primary) hypertension: Secondary | ICD-10-CM | POA: Diagnosis not present

## 2022-02-27 DIAGNOSIS — Z556 Problems related to health literacy: Secondary | ICD-10-CM | POA: Diagnosis not present

## 2022-02-27 DIAGNOSIS — M2170 Unequal limb length (acquired), unspecified site: Secondary | ICD-10-CM | POA: Diagnosis not present

## 2022-03-02 DIAGNOSIS — E559 Vitamin D deficiency, unspecified: Secondary | ICD-10-CM | POA: Diagnosis not present

## 2022-03-02 DIAGNOSIS — E039 Hypothyroidism, unspecified: Secondary | ICD-10-CM | POA: Diagnosis not present

## 2022-03-02 DIAGNOSIS — F0280 Dementia in other diseases classified elsewhere without behavioral disturbance: Secondary | ICD-10-CM | POA: Diagnosis not present

## 2022-03-02 DIAGNOSIS — M2170 Unequal limb length (acquired), unspecified site: Secondary | ICD-10-CM | POA: Diagnosis not present

## 2022-03-02 DIAGNOSIS — E7800 Pure hypercholesterolemia, unspecified: Secondary | ICD-10-CM | POA: Diagnosis not present

## 2022-03-02 DIAGNOSIS — Z556 Problems related to health literacy: Secondary | ICD-10-CM | POA: Diagnosis not present

## 2022-03-02 DIAGNOSIS — Z87891 Personal history of nicotine dependence: Secondary | ICD-10-CM | POA: Diagnosis not present

## 2022-03-02 DIAGNOSIS — Z604 Social exclusion and rejection: Secondary | ICD-10-CM | POA: Diagnosis not present

## 2022-03-02 DIAGNOSIS — Z8616 Personal history of COVID-19: Secondary | ICD-10-CM | POA: Diagnosis not present

## 2022-03-02 DIAGNOSIS — R413 Other amnesia: Secondary | ICD-10-CM | POA: Diagnosis not present

## 2022-03-02 DIAGNOSIS — G300 Alzheimer's disease with early onset: Secondary | ICD-10-CM | POA: Diagnosis not present

## 2022-03-02 DIAGNOSIS — I10 Essential (primary) hypertension: Secondary | ICD-10-CM | POA: Diagnosis not present

## 2022-03-07 DIAGNOSIS — G300 Alzheimer's disease with early onset: Secondary | ICD-10-CM | POA: Diagnosis not present

## 2022-03-07 DIAGNOSIS — M2170 Unequal limb length (acquired), unspecified site: Secondary | ICD-10-CM | POA: Diagnosis not present

## 2022-03-07 DIAGNOSIS — R413 Other amnesia: Secondary | ICD-10-CM | POA: Diagnosis not present

## 2022-03-07 DIAGNOSIS — Z556 Problems related to health literacy: Secondary | ICD-10-CM | POA: Diagnosis not present

## 2022-03-07 DIAGNOSIS — E559 Vitamin D deficiency, unspecified: Secondary | ICD-10-CM | POA: Diagnosis not present

## 2022-03-07 DIAGNOSIS — I10 Essential (primary) hypertension: Secondary | ICD-10-CM | POA: Diagnosis not present

## 2022-03-07 DIAGNOSIS — Z8616 Personal history of COVID-19: Secondary | ICD-10-CM | POA: Diagnosis not present

## 2022-03-07 DIAGNOSIS — E7800 Pure hypercholesterolemia, unspecified: Secondary | ICD-10-CM | POA: Diagnosis not present

## 2022-03-07 DIAGNOSIS — Z604 Social exclusion and rejection: Secondary | ICD-10-CM | POA: Diagnosis not present

## 2022-03-07 DIAGNOSIS — E039 Hypothyroidism, unspecified: Secondary | ICD-10-CM | POA: Diagnosis not present

## 2022-03-07 DIAGNOSIS — Z87891 Personal history of nicotine dependence: Secondary | ICD-10-CM | POA: Diagnosis not present

## 2022-03-07 DIAGNOSIS — F0280 Dementia in other diseases classified elsewhere without behavioral disturbance: Secondary | ICD-10-CM | POA: Diagnosis not present

## 2022-03-09 DIAGNOSIS — Z556 Problems related to health literacy: Secondary | ICD-10-CM | POA: Diagnosis not present

## 2022-03-09 DIAGNOSIS — M2170 Unequal limb length (acquired), unspecified site: Secondary | ICD-10-CM | POA: Diagnosis not present

## 2022-03-09 DIAGNOSIS — Z8616 Personal history of COVID-19: Secondary | ICD-10-CM | POA: Diagnosis not present

## 2022-03-09 DIAGNOSIS — E7800 Pure hypercholesterolemia, unspecified: Secondary | ICD-10-CM | POA: Diagnosis not present

## 2022-03-09 DIAGNOSIS — Z87891 Personal history of nicotine dependence: Secondary | ICD-10-CM | POA: Diagnosis not present

## 2022-03-09 DIAGNOSIS — F0280 Dementia in other diseases classified elsewhere without behavioral disturbance: Secondary | ICD-10-CM | POA: Diagnosis not present

## 2022-03-09 DIAGNOSIS — E039 Hypothyroidism, unspecified: Secondary | ICD-10-CM | POA: Diagnosis not present

## 2022-03-09 DIAGNOSIS — I10 Essential (primary) hypertension: Secondary | ICD-10-CM | POA: Diagnosis not present

## 2022-03-09 DIAGNOSIS — E559 Vitamin D deficiency, unspecified: Secondary | ICD-10-CM | POA: Diagnosis not present

## 2022-03-09 DIAGNOSIS — Z604 Social exclusion and rejection: Secondary | ICD-10-CM | POA: Diagnosis not present

## 2022-03-09 DIAGNOSIS — R413 Other amnesia: Secondary | ICD-10-CM | POA: Diagnosis not present

## 2022-03-09 DIAGNOSIS — G300 Alzheimer's disease with early onset: Secondary | ICD-10-CM | POA: Diagnosis not present

## 2022-03-24 DIAGNOSIS — Z604 Social exclusion and rejection: Secondary | ICD-10-CM | POA: Diagnosis not present

## 2022-03-24 DIAGNOSIS — G300 Alzheimer's disease with early onset: Secondary | ICD-10-CM | POA: Diagnosis not present

## 2022-03-24 DIAGNOSIS — E559 Vitamin D deficiency, unspecified: Secondary | ICD-10-CM | POA: Diagnosis not present

## 2022-03-24 DIAGNOSIS — F0280 Dementia in other diseases classified elsewhere without behavioral disturbance: Secondary | ICD-10-CM | POA: Diagnosis not present

## 2022-03-24 DIAGNOSIS — M2170 Unequal limb length (acquired), unspecified site: Secondary | ICD-10-CM | POA: Diagnosis not present

## 2022-03-24 DIAGNOSIS — E039 Hypothyroidism, unspecified: Secondary | ICD-10-CM | POA: Diagnosis not present

## 2022-03-24 DIAGNOSIS — I10 Essential (primary) hypertension: Secondary | ICD-10-CM | POA: Diagnosis not present

## 2022-03-24 DIAGNOSIS — R413 Other amnesia: Secondary | ICD-10-CM | POA: Diagnosis not present

## 2022-03-24 DIAGNOSIS — Z8616 Personal history of COVID-19: Secondary | ICD-10-CM | POA: Diagnosis not present

## 2022-03-24 DIAGNOSIS — Z556 Problems related to health literacy: Secondary | ICD-10-CM | POA: Diagnosis not present

## 2022-03-24 DIAGNOSIS — E7800 Pure hypercholesterolemia, unspecified: Secondary | ICD-10-CM | POA: Diagnosis not present

## 2022-03-24 DIAGNOSIS — Z87891 Personal history of nicotine dependence: Secondary | ICD-10-CM | POA: Diagnosis not present

## 2022-04-06 DIAGNOSIS — E7800 Pure hypercholesterolemia, unspecified: Secondary | ICD-10-CM | POA: Diagnosis not present

## 2022-04-06 DIAGNOSIS — I10 Essential (primary) hypertension: Secondary | ICD-10-CM | POA: Diagnosis not present

## 2022-04-06 DIAGNOSIS — G300 Alzheimer's disease with early onset: Secondary | ICD-10-CM | POA: Diagnosis not present

## 2022-04-06 DIAGNOSIS — Z8616 Personal history of COVID-19: Secondary | ICD-10-CM | POA: Diagnosis not present

## 2022-04-06 DIAGNOSIS — E559 Vitamin D deficiency, unspecified: Secondary | ICD-10-CM | POA: Diagnosis not present

## 2022-04-06 DIAGNOSIS — Z604 Social exclusion and rejection: Secondary | ICD-10-CM | POA: Diagnosis not present

## 2022-04-06 DIAGNOSIS — E039 Hypothyroidism, unspecified: Secondary | ICD-10-CM | POA: Diagnosis not present

## 2022-04-06 DIAGNOSIS — M2170 Unequal limb length (acquired), unspecified site: Secondary | ICD-10-CM | POA: Diagnosis not present

## 2022-04-06 DIAGNOSIS — Z87891 Personal history of nicotine dependence: Secondary | ICD-10-CM | POA: Diagnosis not present

## 2022-04-06 DIAGNOSIS — Z556 Problems related to health literacy: Secondary | ICD-10-CM | POA: Diagnosis not present

## 2022-04-06 DIAGNOSIS — F0280 Dementia in other diseases classified elsewhere without behavioral disturbance: Secondary | ICD-10-CM | POA: Diagnosis not present

## 2022-04-06 DIAGNOSIS — R413 Other amnesia: Secondary | ICD-10-CM | POA: Diagnosis not present

## 2022-06-08 DIAGNOSIS — B372 Candidiasis of skin and nail: Secondary | ICD-10-CM | POA: Diagnosis not present

## 2022-09-05 ENCOUNTER — Other Ambulatory Visit: Payer: Self-pay

## 2022-09-05 ENCOUNTER — Emergency Department (HOSPITAL_COMMUNITY): Payer: Medicare Other

## 2022-09-05 ENCOUNTER — Encounter (HOSPITAL_COMMUNITY): Payer: Self-pay

## 2022-09-05 ENCOUNTER — Emergency Department (HOSPITAL_COMMUNITY)
Admission: EM | Admit: 2022-09-05 | Discharge: 2022-09-05 | Disposition: A | Discharge status: Home / Self Care | Payer: Medicare Other | Attending: Student | Admitting: Student

## 2022-09-05 DIAGNOSIS — S82832A Other fracture of upper and lower end of left fibula, initial encounter for closed fracture: Secondary | ICD-10-CM | POA: Insufficient documentation

## 2022-09-05 DIAGNOSIS — M25572 Pain in left ankle and joints of left foot: Secondary | ICD-10-CM | POA: Diagnosis not present

## 2022-09-05 DIAGNOSIS — S99922A Unspecified injury of left foot, initial encounter: Secondary | ICD-10-CM | POA: Diagnosis not present

## 2022-09-05 DIAGNOSIS — S82892A Other fracture of left lower leg, initial encounter for closed fracture: Secondary | ICD-10-CM

## 2022-09-05 DIAGNOSIS — W19XXXA Unspecified fall, initial encounter: Secondary | ICD-10-CM | POA: Diagnosis not present

## 2022-09-05 DIAGNOSIS — W1830XA Fall on same level, unspecified, initial encounter: Secondary | ICD-10-CM | POA: Diagnosis not present

## 2022-09-05 DIAGNOSIS — I10 Essential (primary) hypertension: Secondary | ICD-10-CM | POA: Diagnosis not present

## 2022-09-05 DIAGNOSIS — Y92096 Garden or yard of other non-institutional residence as the place of occurrence of the external cause: Secondary | ICD-10-CM | POA: Diagnosis not present

## 2022-09-05 DIAGNOSIS — S82425A Nondisplaced transverse fracture of shaft of left fibula, initial encounter for closed fracture: Secondary | ICD-10-CM | POA: Diagnosis not present

## 2022-09-05 DIAGNOSIS — F0390 Unspecified dementia without behavioral disturbance: Secondary | ICD-10-CM | POA: Diagnosis not present

## 2022-09-05 DIAGNOSIS — Z87891 Personal history of nicotine dependence: Secondary | ICD-10-CM | POA: Diagnosis not present

## 2022-09-05 DIAGNOSIS — S82302A Unspecified fracture of lower end of left tibia, initial encounter for closed fracture: Secondary | ICD-10-CM | POA: Insufficient documentation

## 2022-09-05 MED ORDER — HYDROCODONE-ACETAMINOPHEN 5-325 MG PO TABS: 2.0000 | ORAL_TABLET | ORAL | 0 refills | Status: DC | PRN

## 2022-09-05 MED ORDER — NAPROXEN 375 MG PO TABS: 375.0000 mg | ORAL_TABLET | Freq: Two times a day (BID) | ORAL | 0 refills | Status: DC

## 2022-09-05 MED ORDER — HYDROCODONE-ACETAMINOPHEN 5-325 MG PO TABS
1.0000 | ORAL_TABLET | Freq: Once | ORAL | Status: AC
  Administered 2022-09-05: 1 via ORAL
  Filled 2022-09-05: qty 1

## 2022-09-05 NOTE — ED Notes (Signed)
Short leg splint applied; pt tolerated well; pt has sensation and good capillary refill; EDP made aware to go check on pt

## 2022-09-05 NOTE — ED Triage Notes (Signed)
Pt had fall at home in yard and hurt left ankle.

## 2022-09-05 NOTE — ED Provider Notes (Signed)
[place] EMERGENCY DEPARTMENT AT [facility] Provider Note  CSN: [id number] Arrival date & time: 09/05/22 1240  Chief Complaint(s) Ankle Pain (Injured left ankle)  HPI Donna Jacobs is a 62 y.o. female with early onset dementia, rheumatoid arthritis who presents Emergency Department for evaluation of left ankle pain.  Patient states that she fell at home in the yard and had sudden onset pain and swelling of the left ankle.  Unable to bear weight.  History difficult to obtain with patient's underlying dementia but currently she is not complaining of any additional symptoms including chest pain, shortness of breath, Donnell pain, nausea, vomiting or other systemic symptoms.  Did not hit her head or lose consciousness.  No blood thinner use.   Past Medical History Past Medical History:  Diagnosis Date   History of substance abuse    Briefly spent time in substance abuse rehabilitation treatment as a teenager. Cocaine likely represented the primary drug of abuse.    Hyperlipidemia    "I used to take med. for cholesteol, but only for a short time"   Major neurocognitive disorder 05/28/2019   Menopausal syndrome    Rheumatoid arthritis    Lumbar - spondylisthesis   S/P lumbar spinal fusion 08/15/2016   Vitamin D deficiency    Patient Active Problem List   Diagnosis Date Noted   Major neurocognitive disorder, possible Alzheimer's disease 05/28/2019   Menopausal flushing    Menopausal syndrome    Vitamin D deficiency    Hyperlipidemia    Rheumatoid arthritis    History of substance abuse    S/P lumbar spinal fusion 08/15/2016   Home Medication(s) Prior to Admission medications   Medication Sig Start Date End Date Taking? Authorizing Provider  HYDROcodone-acetaminophen (NORCO/VICODIN) 5-325 MG tablet Take 2 tablets by mouth every 4 (four) hours as needed. 09/05/22  Yes Robyn Galati, MD  naproxen (NAPROSYN) 375 MG tablet Take 1 tablet (375 mg total) by mouth 2 (two) times  daily. 09/05/22  Yes Criss Bartles, MD  donepezil (ARICEPT) 10 MG tablet TAKE 1 TABLET BY MOUTH AT BEDTIME 12/12/21   Lomax, Amy, NP  ezetimibe (ZETIA) 10 MG tablet Take 10 mg by mouth daily. 12/14/20   [provider]  memantine (NAMENDA) 10 MG tablet Take 1 tablet (10 mg total) by mouth 2 (two) times daily. 10/25/21   Lomax, Amy, NP  Multiple Vitamins-Minerals (MULTIVITAMIN WOMEN) TABS Take 1 tablet by mouth daily in the afternoon.    [provider]                                                                                                                                    Past Surgical History Past Surgical History:  Procedure Laterality Date   ABDOMINAL HYSTERECTOMY     TUBAL LIGATION     VAGINAL DELIVERY     x2   Family History Family History  Problem Relation Age  of Onset   Alzheimer's disease Paternal Grandmother     Social History Social History   Tobacco Use   Smoking status: Former    Current packs/day: 0.00    Types: Cigarettes    Quit date: 08/10/1975    Years since quitting: 47.1   Smokeless tobacco: Never  Substance Use Topics   Alcohol use: Yes    Comment: very rarely   Drug use: Not Currently    Types: Cocaine, Marijuana   Allergies Lipitor [atorvastatin calcium] and Codeine  Review of Systems Review of Systems  Musculoskeletal:  Positive for arthralgias and joint swelling.    Physical Exam Vital Signs  I have reviewed the triage vital signs BP 115/77 (BP Location: Right Arm)   Pulse 66   Temp 98.1 F (36.7 C)   Resp 18   Ht 5' (1.524 m)   Wt 81.4 kg   SpO2 92%   BMI 35.05 kg/m   Physical Exam Vitals and nursing note reviewed.  Constitutional:      General: She is not in acute distress.    Appearance: She is well-developed.  HENT:     Head: Normocephalic and atraumatic.  Eyes:     Conjunctiva/sclera: Conjunctivae normal.  Cardiovascular:     Rate and Rhythm: Normal rate and regular rhythm.     Heart sounds: No  murmur heard. Pulmonary:     Effort: Pulmonary effort is normal. No respiratory distress.     Breath sounds: Normal breath sounds.  Abdominal:     Palpations: Abdomen is soft.     Tenderness: There is no abdominal tenderness.  Musculoskeletal:        General: Swelling, tenderness and deformity present.     Cervical back: Neck supple.  Skin:    General: Skin is warm and dry.     Capillary Refill: Capillary refill takes less than 2 seconds.  Neurological:     Mental Status: She is alert.  Psychiatric:        Mood and Affect: Mood normal.     ED Results and Treatments Labs (all labs ordered are listed, but only abnormal results are displayed) Labs Reviewed - No data to display                                                                                                                        Radiology DG Ankle Complete Left  Result Date: 09/05/2022 CLINICAL DATA:  Pain after fall EXAM: LEFT ANKLE COMPLETE - 3 VIEW COMPARISON:  X-ray 08/25/2013 FINDINGS: Soft tissue swelling. There is a comminuted fracture of the distal tibia involving the base of the medial malleolus obliquely as well as along the metaphysis of the tibia. There is also a transverse fracture of the distal fibular metaphysis which is nondisplaced. Well corticated plantar calcaneal spur. IMPRESSION: Soft tissue swelling.  Fractures of the distal tibia and fibula. Electronically Signed   By: Karen Kays M.D.   On: 09/05/2022 14:51    Pertinent labs &  imaging results that were available during my care of the patient were reviewed by me and considered in my medical decision making (see MDM for details).  Medications Ordered in ED Medications  HYDROcodone-acetaminophen (NORCO/VICODIN) 5-325 MG per tablet 1 tablet (1 tablet Oral Given 09/05/22 1445)                                                                                                                                     Procedures .Ortho Injury  Treatment  Date/Time: 09/05/2022 9:40 PM  Performed by: Glendora Score, MD Authorized by: Glendora Score, MD   Consent:    Consent obtained:  Verbal   Consent given by:  Patient   Risks discussed:  Fracture, nerve damage, restricted joint movement and vascular damage   Alternatives discussed:  No treatment and alternative treatmentInjury location: ankle Location details: left ankle Injury type: fracture Fracture type: bimalleolar Pre-procedure distal perfusion: normal Pre-procedure neurological function: normal Pre-procedure range of motion: reduced  Patient sedated: NoImmobilization: splint Splint type: ankle stirrup and short leg Splint Applied by: ED Nurse Supplies used: Ortho-Glass Post-procedure distal perfusion: normal Post-procedure neurological function: normal Post-procedure range of motion: unchanged     (including critical care time)  Medical Decision Making / ED Course   This patient presents to the ED for concern of ankle pain, this involves an extensive number of treatment options, and is a complaint that carries with it a high risk of complications and morbidity.  The differential diagnosis includes fracture, hematoma, ligamentous injury, contusion, sprain  MDM: Patient seen emergency room for evaluation of ankle pain.  Physical exam with significant tenderness and swelling to the left ankle but no additional external evidence of trauma.  Patient is neurovascularly intact.  X-ray imaging concerning for bimalleolar ankle fracture.  I spoke to Dr. Dallas Schimke of orthopedics over epic chat who will agree to see the patient in outpatient clinic.  Splint applied and crutches provided.  Pain control regimen prescribed and the patient will require outpatient follow-up with orthopedics but at this time does not meet inpatient criteria for admission.  Patient then discharged with outpatient follow-up.   Additional history obtained: -Additional history obtained from  husband -External records from outside source obtained and reviewed including: Chart review including previous notes, labs, imaging, consultation notes    Imaging Studies ordered: I ordered imaging studies including ankle x-ray I independently visualized and interpreted imaging. I agree with the radiologist interpretation   Medicines ordered and prescription drug management: Meds ordered this encounter  Medications   HYDROcodone-acetaminophen (NORCO/VICODIN) 5-325 MG per tablet 1 tablet   HYDROcodone-acetaminophen (NORCO/VICODIN) 5-325 MG tablet    Sig: Take 2 tablets by mouth every 4 (four) hours as needed.    Dispense:  10 tablet    Refill:  0   naproxen (NAPROSYN) 375 MG tablet    Sig: Take 1 tablet (375 mg total) by mouth 2 (two) times daily.  Dispense:  20 tablet    Refill:  0    -I have reviewed the patients home medicines and have made adjustments as needed  Critical interventions none  Consultations Obtained: I requested consultation with the orthopedic surgeon on-call Dr. Dallas Schimke ,  and discussed lab and imaging findings as well as pertinent plan - they recommend: Outpatient follow-up, splint   Cardiac Monitoring: The patient was maintained on a cardiac monitor.  I personally viewed and interpreted the cardiac monitored which showed an underlying rhythm of: NSR  Social Determinants of Health:  Factors impacting patients care include: none   Reevaluation: After the interventions noted above, I reevaluated the patient and found that they have :improved  Co morbidities that complicate the patient evaluation  Past Medical History:  Diagnosis Date   History of substance abuse    Briefly spent time in substance abuse rehabilitation treatment as a teenager. Cocaine likely represented the primary drug of abuse.    Hyperlipidemia    "I used to take med. for cholesteol, but only for a short time"   Major neurocognitive disorder 05/28/2019   Menopausal syndrome     Rheumatoid arthritis    Lumbar - spondylisthesis   S/P lumbar spinal fusion 08/15/2016   Vitamin D deficiency       Dispostion: I considered admission for this patient, but at this time she does not meet inpatient criteria for admission and she is here for discharge with outpatient follow-up     Final Clinical Impression(s) / ED Diagnoses Final diagnoses:  Closed fracture of left ankle, initial encounter     @PCDICTATION @    Glendora Score, MD 09/05/22 2143

## 2022-09-05 NOTE — Discharge Instructions (Addendum)
For pain:  - Acetaminophen 1000 mg three times daily (every 8 hours) - Naproxen 2 times daily (every 12 hours) - oxycodone for breakthrough pain only 

## 2022-09-13 ENCOUNTER — Ambulatory Visit: Payer: Medicare Other | Admitting: Podiatry

## 2022-09-13 ENCOUNTER — Encounter (HOSPITAL_COMMUNITY): Payer: Self-pay

## 2022-09-13 ENCOUNTER — Emergency Department (HOSPITAL_COMMUNITY): Payer: Medicare Other

## 2022-09-13 ENCOUNTER — Ambulatory Visit (INDEPENDENT_AMBULATORY_CARE_PROVIDER_SITE_OTHER): Payer: Medicare Other

## 2022-09-13 ENCOUNTER — Other Ambulatory Visit: Payer: Self-pay

## 2022-09-13 ENCOUNTER — Inpatient Hospital Stay (HOSPITAL_COMMUNITY)
Admission: EM | Admit: 2022-09-13 | Discharge: 2022-09-20 | DRG: 494 | Disposition: A | Discharge status: Skilled Nursing Facility | Payer: Medicare Other | Attending: Internal Medicine | Admitting: Internal Medicine

## 2022-09-13 ENCOUNTER — Encounter: Payer: Self-pay | Admitting: Podiatry

## 2022-09-13 DIAGNOSIS — Z7401 Bed confinement status: Secondary | ICD-10-CM | POA: Diagnosis not present

## 2022-09-13 DIAGNOSIS — E785 Hyperlipidemia, unspecified: Secondary | ICD-10-CM | POA: Diagnosis present

## 2022-09-13 DIAGNOSIS — S82852S Displaced trimalleolar fracture of left lower leg, sequela: Principal | ICD-10-CM

## 2022-09-13 DIAGNOSIS — S82853A Displaced trimalleolar fracture of unspecified lower leg, initial encounter for closed fracture: Secondary | ICD-10-CM | POA: Diagnosis not present

## 2022-09-13 DIAGNOSIS — S82892A Other fracture of left lower leg, initial encounter for closed fracture: Secondary | ICD-10-CM

## 2022-09-13 DIAGNOSIS — F0280 Dementia in other diseases classified elsewhere without behavioral disturbance: Secondary | ICD-10-CM | POA: Diagnosis present

## 2022-09-13 DIAGNOSIS — W1830XA Fall on same level, unspecified, initial encounter: Secondary | ICD-10-CM | POA: Diagnosis not present

## 2022-09-13 DIAGNOSIS — F0390 Unspecified dementia without behavioral disturbance: Secondary | ICD-10-CM | POA: Diagnosis not present

## 2022-09-13 DIAGNOSIS — D1724 Benign lipomatous neoplasm of skin and subcutaneous tissue of left leg: Secondary | ICD-10-CM | POA: Diagnosis not present

## 2022-09-13 DIAGNOSIS — Z90710 Acquired absence of both cervix and uterus: Secondary | ICD-10-CM | POA: Diagnosis not present

## 2022-09-13 DIAGNOSIS — M6259 Muscle wasting and atrophy, not elsewhere classified, multiple sites: Secondary | ICD-10-CM | POA: Diagnosis not present

## 2022-09-13 DIAGNOSIS — S82852A Displaced trimalleolar fracture of left lower leg, initial encounter for closed fracture: Principal | ICD-10-CM | POA: Diagnosis present

## 2022-09-13 DIAGNOSIS — G300 Alzheimer's disease with early onset: Secondary | ICD-10-CM | POA: Diagnosis present

## 2022-09-13 DIAGNOSIS — S82899A Other fracture of unspecified lower leg, initial encounter for closed fracture: Secondary | ICD-10-CM | POA: Diagnosis not present

## 2022-09-13 DIAGNOSIS — Z87891 Personal history of nicotine dependence: Secondary | ICD-10-CM | POA: Diagnosis not present

## 2022-09-13 DIAGNOSIS — Z6835 Body mass index (BMI) 35.0-35.9, adult: Secondary | ICD-10-CM | POA: Diagnosis not present

## 2022-09-13 DIAGNOSIS — R404 Transient alteration of awareness: Secondary | ICD-10-CM | POA: Diagnosis not present

## 2022-09-13 DIAGNOSIS — R531 Weakness: Secondary | ICD-10-CM | POA: Diagnosis not present

## 2022-09-13 DIAGNOSIS — Z820 Family history of epilepsy and other diseases of the nervous system: Secondary | ICD-10-CM

## 2022-09-13 DIAGNOSIS — M6281 Muscle weakness (generalized): Secondary | ICD-10-CM | POA: Diagnosis not present

## 2022-09-13 DIAGNOSIS — Z888 Allergy status to other drugs, medicaments and biological substances status: Secondary | ICD-10-CM

## 2022-09-13 DIAGNOSIS — Z981 Arthrodesis status: Secondary | ICD-10-CM

## 2022-09-13 DIAGNOSIS — R600 Localized edema: Secondary | ICD-10-CM | POA: Diagnosis not present

## 2022-09-13 DIAGNOSIS — W19XXXA Unspecified fall, initial encounter: Secondary | ICD-10-CM | POA: Diagnosis not present

## 2022-09-13 DIAGNOSIS — Y92007 Garden or yard of unspecified non-institutional (private) residence as the place of occurrence of the external cause: Secondary | ICD-10-CM | POA: Diagnosis not present

## 2022-09-13 DIAGNOSIS — E669 Obesity, unspecified: Secondary | ICD-10-CM | POA: Diagnosis not present

## 2022-09-13 DIAGNOSIS — Z885 Allergy status to narcotic agent status: Secondary | ICD-10-CM

## 2022-09-13 DIAGNOSIS — S82852D Displaced trimalleolar fracture of left lower leg, subsequent encounter for closed fracture with routine healing: Secondary | ICD-10-CM

## 2022-09-13 DIAGNOSIS — E559 Vitamin D deficiency, unspecified: Secondary | ICD-10-CM | POA: Diagnosis present

## 2022-09-13 DIAGNOSIS — R918 Other nonspecific abnormal finding of lung field: Secondary | ICD-10-CM | POA: Diagnosis not present

## 2022-09-13 DIAGNOSIS — R0989 Other specified symptoms and signs involving the circulatory and respiratory systems: Secondary | ICD-10-CM | POA: Diagnosis not present

## 2022-09-13 DIAGNOSIS — Z79899 Other long term (current) drug therapy: Secondary | ICD-10-CM | POA: Diagnosis not present

## 2022-09-13 DIAGNOSIS — Z01818 Encounter for other preprocedural examination: Secondary | ICD-10-CM | POA: Diagnosis not present

## 2022-09-13 DIAGNOSIS — M069 Rheumatoid arthritis, unspecified: Secondary | ICD-10-CM | POA: Diagnosis present

## 2022-09-13 LAB — BASIC METABOLIC PANEL
Anion gap: 10 (ref 5–15)
BUN: 17 mg/dL (ref 6–20)
CO2: 24 mmol/L (ref 22–32)
Calcium: 8.8 mg/dL — ABNORMAL LOW (ref 8.9–10.3)
Chloride: 103 mmol/L (ref 98–111)
Creatinine, Ser: 0.67 mg/dL (ref 0.44–1.00)
GFR, Estimated: >60 mL/min (ref >60)
Glucose, Bld: 144 mg/dL — ABNORMAL HIGH (ref 70–99)
Potassium: 3.7 mmol/L (ref 3.5–5.1)
Sodium: 137 mmol/L (ref 135–145)

## 2022-09-13 LAB — CBC WITH DIFFERENTIAL/PLATELET
Abs Immature Granulocytes: 0.01 10*3/uL (ref 0.00–0.07)
Basophils Absolute: 0.1 10*3/uL (ref 0.0–0.1)
Basophils Relative: 1 %
Eosinophils Absolute: 0.1 10*3/uL (ref 0.0–0.5)
Eosinophils Relative: 1 %
HCT: 41.3 % (ref 36.0–46.0)
Hemoglobin: 13.9 g/dL (ref 12.0–15.0)
Immature Granulocytes: 0 %
Lymphocytes Relative: 21 %
Lymphs Abs: 1.8 10*3/uL (ref 0.7–4.0)
MCH: 30 pg (ref 26.0–34.0)
MCHC: 33.7 g/dL (ref 30.0–36.0)
MCV: 89 fL (ref 80.0–100.0)
Monocytes Absolute: 0.6 10*3/uL (ref 0.1–1.0)
Monocytes Relative: 7 %
Neutro Abs: 5.9 10*3/uL (ref 1.7–7.7)
Neutrophils Relative %: 70 %
Platelets: 251 10*3/uL (ref 150–400)
RBC: 4.64 MIL/uL (ref 3.87–5.11)
RDW: 12.6 % (ref 11.5–15.5)
WBC: 8.5 10*3/uL (ref 4.0–10.5)
nRBC: 0 % (ref 0.0–0.2)

## 2022-09-13 LAB — I-STAT CHEM 8, ED
BUN: 17 mg/dL (ref 6–20)
Calcium, Ion: 1.18 mmol/L (ref 1.15–1.40)
Chloride: 104 mmol/L (ref 98–111)
Creatinine, Ser: 0.6 mg/dL (ref 0.44–1.00)
Glucose, Bld: 140 mg/dL — ABNORMAL HIGH (ref 70–99)
HCT: 41 % (ref 36.0–46.0)
Hemoglobin: 13.9 g/dL (ref 12.0–15.0)
Potassium: 3.7 mmol/L (ref 3.5–5.1)
Sodium: 139 mmol/L (ref 135–145)
TCO2: 24 mmol/L (ref 22–32)

## 2022-09-13 NOTE — Plan of Care (Signed)
Orthopedic Plan of Care Note  Patient has a trimalleolar ankle fracture on the left side. This is an unstable fracture and will require operative fixation. She should remain non-weight bearing in the splint. Plan to undergo surgery tomorrow. Please make NPO at midnight. Hold all anticoagulation in anticipation of surgery. Will be non-weight bearing post-operatively in a splint. Since she has been having difficulty managing non-weight bearing status already, anticipate she will need a SNF at discharge.   London Sheer, MD Orthopedic Surgeon

## 2022-09-13 NOTE — H&P (Signed)
History and Physical   [facility] [place] @ [facility] Admission History and Physical [employer], D.O.    Patient Name: Donna Jacobs MR#: [id number] Date of Birth: 1960/08/30 Date of Admission: 09/13/2022  Referring MD/NP/PA: Dr. Anitra Lauth Primary Care Physician: Catha Gosselin, MD  Chief Complaint:  Chief Complaint  Patient presents with   Ankle Pain  Please note the entire history is obtained from the patient's emergency department chart, emergency department provider Patient's personal history is limited by dementia and poor historian  HPI: Donna Jacobs is a 62 y.o. female with a known history of dementia, hyperlipidemia, rheumatoid arthritis, history of substance abuse presents to the emergency department for evaluation of ankle pain status post fall.  Patient was in a usual state of health until 8 days ago when she sustained a fall in her yard, was seen in the [facility] emergency department and found to have a bimalleolar fracture of the left ankle and was splinted.  She was instructed to follow-up with orthopedics but ended up going to podiatry today because of difficulty with weightbearing.  She was sent to the emergency department by her podiatrist for evaluation of the ankle pain.  EMS/ED Course: Medical admission has been requested for further surgical management of trimalleolar fracture of the left ankle.   Review of Systems:  Unable to obtain secondary to dementia  Past Medical History:  Diagnosis Date   History of substance abuse    Briefly spent time in substance abuse rehabilitation treatment as a teenager. Cocaine likely represented the primary drug of abuse.    Hyperlipidemia    "I used to take med. for cholesteol, but only for a short time"   Major neurocognitive disorder 05/28/2019   Menopausal syndrome    Rheumatoid arthritis    Lumbar - spondylisthesis   S/P lumbar spinal fusion 08/15/2016   Vitamin D deficiency     Past Surgical History:  Procedure  Laterality Date   ABDOMINAL HYSTERECTOMY     TUBAL LIGATION     VAGINAL DELIVERY     x2     reports that she quit smoking about 47 years ago. Her smoking use included cigarettes. She has never used smokeless tobacco. She reports current alcohol use. She reports that she does not currently use drugs after having used the following drugs: Cocaine and Marijuana.  Allergies  Allergen Reactions   Lipitor [Atorvastatin Calcium]    Codeine Nausea And Vomiting    Family History  Problem Relation Age of Onset   Alzheimer's disease Paternal Grandmother     Prior to Admission medications   Medication Sig Start Date End Date Taking? Authorizing Provider  donepezil (ARICEPT) 10 MG tablet TAKE 1 TABLET BY MOUTH AT BEDTIME 12/12/21   Lomax, Amy, NP  ezetimibe (ZETIA) 10 MG tablet Take 10 mg by mouth daily. 12/14/20   [provider]  HYDROcodone-acetaminophen (NORCO/VICODIN) 5-325 MG tablet Take 2 tablets by mouth every 4 (four) hours as needed. 09/05/22   Kommor, Madison, MD  memantine (NAMENDA) 10 MG tablet Take 1 tablet (10 mg total) by mouth 2 (two) times daily. 10/25/21   Lomax, Amy, NP  Multiple Vitamins-Minerals (MULTIVITAMIN WOMEN) TABS Take 1 tablet by mouth daily in the afternoon.    [provider]  naproxen (NAPROSYN) 375 MG tablet Take 1 tablet (375 mg total) by mouth 2 (two) times daily. 09/05/22   Glendora Score, MD    Physical Exam: Vitals:   09/13/22 1904  BP: (!) 154/89  Pulse:  76  Resp: 16  Temp: 98.5 F (36.9 C)  TempSrc: Oral  SpO2: 100%    GENERAL: 62 y.o.-year-old white female patient, well-developed, well-nourished lying in the bed in no acute distress.  Does not look confused HEENT: Head atraumatic, normocephalic. Pupils equal. Mucus membranes moist. NECK: Supple. No JVD. CHEST: Normal breath sounds bilaterally. No wheezing, rales, rhonchi or crackles. No use of accessory muscles of respiration.  No reproducible chest wall tenderness.   CARDIOVASCULAR: S1, S2 normal. No murmurs, rubs, or gallops. Cap refill <2 seconds. Pulses intact distally.  ABDOMEN: Soft, nondistended, nontender. No rebound, guarding, rigidity. Normoactive bowel sounds present in all four quadrants.  EXTREMITIES: Left lower leg is fully splinted.  No pedal edema, cyanosis, or clubbing. No calf tenderness or Homan's sign.  NEUROLOGIC: The patient is alert and oriented x 1. Cranial nerves II through XII are grossly intact with no focal sensorimotor deficit. PSYCHIATRIC:  Normal affect, mood, thought content. SKIN: Warm, dry, and intact without obvious rash, lesion, or ulcer.    Labs on Admission:  CBC: Recent Labs  Lab 09/13/22 2154 09/13/22 2159  WBC 8.5  --   NEUTROABS 5.9  --   HGB 13.9 13.9  HCT 41.3 41.0  MCV 89.0  --   PLT 251  --    Basic Metabolic Panel: Recent Labs  Lab 09/13/22 2154 09/13/22 2159  NA 137 139  K 3.7 3.7  CL 103 104  CO2 24  --   GLUCOSE 144* 140*  BUN 17 17  CREATININE 0.67 0.60  CALCIUM 8.8*  --    GFR: Estimated Creatinine Clearance: 70.7 mL/min (by C-G formula based on SCr of 0.6 mg/dL). Liver Function Tests: No results for input(s): "AST", "ALT", "ALKPHOS", "BILITOT", "PROT", "ALBUMIN" in the last 168 hours. No results for input(s): "LIPASE", "AMYLASE" in the last 168 hours. No results for input(s): "AMMONIA" in the last 168 hours. Coagulation Profile: No results for input(s): "INR", "PROTIME" in the last 168 hours. Cardiac Enzymes: No results for input(s): "CKTOTAL", "CKMB", "CKMBINDEX", "TROPONINI" in the last 168 hours. BNP (last 3 results) No results for input(s): "PROBNP" in the last 8760 hours. HbA1C: No results for input(s): "HGBA1C" in the last 72 hours. CBG: No results for input(s): "GLUCAP" in the last 168 hours. Lipid Profile: No results for input(s): "CHOL", "HDL", "LDLCALC", "TRIG", "CHOLHDL", "LDLDIRECT" in the last 72 hours. Thyroid Function Tests: No results for input(s): "TSH",  "T4TOTAL", "FREET4", "T3FREE", "THYROIDAB" in the last 72 hours. Anemia Panel: No results for input(s): "VITAMINB12", "FOLATE", "FERRITIN", "TIBC", "IRON", "RETICCTPCT" in the last 72 hours. Urine analysis: No results found for: "COLORURINE", "APPEARANCEUR", "LABSPEC", "PHURINE", "GLUCOSEU", "HGBUR", "BILIRUBINUR", "KETONESUR", "PROTEINUR", "UROBILINOGEN", "NITRITE", "LEUKOCYTESUR" Sepsis Labs: @LABRCNTIP (procalcitonin:4,lacticidven:4) )No results found for this or any previous visit (from the past 240 hour(s)).   Radiological Exams on Admission: CT Ankle Left Wo Contrast  Result Date: 09/13/2022 CLINICAL DATA:  Acute ankle fracture EXAM: CT OF THE LEFT ANKLE WITHOUT CONTRAST TECHNIQUE: Multidetector CT imaging of the left ankle was performed according to the standard protocol. Multiplanar CT image reconstructions were also generated. RADIATION DOSE REDUCTION: This exam was performed according to the departmental dose-optimization program which includes automated exposure control, adjustment of the mA and/or kV according to patient size and/or use of iterative reconstruction technique. COMPARISON:  Radiographs 09/13/2022 FINDINGS: Bones/Joint/Cartilage Mixed appearance with elements of stage 2Weber A and stage 4 Weber B fracture of the ankle is identified with a transverse lateral malleolar component and a primarily oblique/longitudinal medial malleolar  component as shown on image 49 series 6. There is some additional comminution including a transverse medial malleolar tip component as well as an oblique posterior malleolar component. This fracture pattern is unstable. A 6 mm free cortical fragment sits in the joint anterior to the talar dome on image 32 series 7. Degenerative subcortical cyst formation noted between the navicular and the middle cuneiform dorsally. Ligaments Suboptimally assessed by CT. Muscles and Tendons Fat density potentially reflecting lipoma noted in the distal extensor digitorum  longus muscle on image 1 series 5. No flexor tendon entrapment.  Achilles tendon intact. Soft tissues Circumferential subcutaneous edema in the ankle and tracking into the dorsum of foot. IMPRESSION: 1. Mixed features of stage 2 Weber A and stage 4 Weber B fracture of the ankle as detailed above, with a 6 mm free cortical fragment in the joint anterior to the talar dome. The fracture pattern is unstable. 2. Circumferential subcutaneous edema in the ankle and tracking into the dorsum of foot. 3. Fat density potentially reflecting lipoma in the distal extensor digitorum longus muscle. Electronically Signed   By: Gaylyn Rong M.D.   On: 09/13/2022 20:54   DG Ankle Complete Left  Result Date: 09/13/2022 Please see detailed radiograph report in office note.   EKG: Pending  Assessment/Plan  This is a 62 y.o. female with a history of dementia, hyperlipidemia, rheumatoid arthritis, history of substance abuse  now being admitted with:  #.  Trimalleolar fracture of the left ankle -Admit inpatient -Surgical management per Ortho plan for OR tomorrow - N.p.o. after midnight - Pain control - Check INR and EKG -please follow-up in a.m. - Hold anticoagulants - PT and SW for discharge planning anticipate SNF  #.  History of dementia - Continue donepezil and memantine  #.  History of hyperlipidemia - Continue ezetimibe  Admission status: Inpatient IV Fluids: LR Diet/Nutrition: N.p.o. Consults called: Ortho, PT, social work DVT Px: SCDs and early ambulation. Code Status: Full Code  Disposition Plan: To be determined  All the records are reviewed and case discussed with ED provider. Management plans discussed with the patient and/or family who express understanding and agree with plan of care.  Meredeth Furber D.O. on 09/13/2022 at 11:24 PM CC: Primary care physician; Catha Gosselin, MD   09/13/2022, 11:24 PM

## 2022-09-13 NOTE — Progress Notes (Addendum)
  Subjective:  Patient ID: Donna Jacobs, female    DOB: 12-10-1960,  MRN: [id number]  Chief Complaint  Patient presents with   Foot Pain    Rm 2: broken ankle needs surgery consultation    62 y.o. female presents with the above complaint. History confirmed with patient.  Patient presents for evaluation of left ankle injury suffered on 09/05/2022 from a fall at home.  They had presented to the [facility] emergency room and were evaluated with x-rays and she was placed into a splint and discharged home to be nonweightbearing.  This has been very difficult for her and her husband.  She is having to sleep in a recliner and he is having to help her and do a bedside commode.  Her ankle is painful.  Objective:  Physical Exam: warm, good capillary refill, normal sensory exam, and left lower extremity and ankle is in a below-knee posterior splint with Ace wrap intact, range of motion is intact to digit, good capillary refill.  Radiographs: Multiple views x-ray of left ankle: Radiographs taken today of the left ankle show trimalleolar ankle fracture Assessment:   1. Closed fracture of left ankle, initial encounter      Plan:  Patient was evaluated and treated and all questions answered.  I reviewed the radiographs and discussed the plan of care with the patient his wife and her daughter who was available by telephone.  Currently at home being complete nonweightbearing on the left lower extremity is quite difficult for her and her spouse.  She does have memory issues.  We had initially discussed outpatient ORIF with outpatient CT scan next week at [facility] with an overnight admit and PT/rehab following surgery but due to her current social issues and living situation likely would be best to address sooner and she would be admitted to the hospital.  The family would prefer to stay in [facility] and go to [facility].  I agreed with this plan of care and they will proceed to the [facility] emergency  room, EMS had to be called to transport her today due to difficulty with the mobility, I also spoke with the ER charge nurse and they will plan to look out for her and consult orthopedics for ORIF and management  No follow-ups on file.

## 2022-09-13 NOTE — ED Triage Notes (Signed)
Pt BIB EMS for surgery. Pt fell yesterday and broke left ankle. Ankle is wrapped. Pt has dementia

## 2022-09-13 NOTE — ED Notes (Signed)
ED TO INPATIENT HANDOFF REPORT  ED Nurse Name and Phone #: [phone]  S Name/Age/Gender Donna Jacobs 62 y.o. female Room/Bed: 033C/033C  Code Status   Code Status: Full Code  Home/SNF/Other Home Patient oriented to: self and situation Is this baseline? Yes   Triage Complete: Triage complete  Chief Complaint Trimalleolar fracture of ankle, closed [S82.853A]  Triage Note Pt BIB EMS for surgery. Pt fell yesterday and broke left ankle. Ankle is wrapped. Pt has dementia    Allergies Allergies  Allergen Reactions   Lipitor [Atorvastatin Calcium]    Codeine Nausea And Vomiting    Level of Care/Admitting Diagnosis ED Disposition     ED Disposition  Admit   Condition  --   Comment  Hospital Area: [facility] [[id number]]  Level of Care: Med-Surg [16]  May admit patient to [facility] or [facility] if equivalent level of care is available:: No  Covid Evaluation: Asymptomatic - no recent exposure (last 10 days) testing not required  Diagnosis: Trimalleolar fracture of ankle, closed [960454]  Admitting Physician: Tonye Royalty [[id number]]  Attending Physician: Janann Colonel  Certification:: I certify this patient will need inpatient services for at least 2 midnights  Expected Medical Readiness: 09/15/2022          B Medical/Surgery History Past Medical History:  Diagnosis Date   History of substance abuse    Briefly spent time in substance abuse rehabilitation treatment as a teenager. Cocaine likely represented the primary drug of abuse.    Hyperlipidemia    "I used to take med. for cholesteol, but only for a short time"   Major neurocognitive disorder 05/28/2019   Menopausal syndrome    Rheumatoid arthritis    Lumbar - spondylisthesis   S/P lumbar spinal fusion 08/15/2016   Vitamin D deficiency    Past Surgical History:  Procedure Laterality Date   ABDOMINAL HYSTERECTOMY     TUBAL LIGATION     VAGINAL DELIVERY     x2      A IV Location/Drains/Wounds Patient Lines/Drains/Airways Status     Active Line/Drains/Airways     None            Intake/Output Last 24 hours No intake or output data in the 24 hours ending 09/13/22 2352  Labs/Imaging Results for orders placed or performed during the hospital encounter of 09/13/22 (from the past 48 hour(s))  CBC with Differential/Platelet     Status: None   Collection Time: 09/13/22  9:54 PM  Result Value Ref Range   WBC 8.5 4.0 - 10.5 K/uL   RBC 4.64 3.87 - 5.11 MIL/uL   Hemoglobin 13.9 12.0 - 15.0 g/dL   HCT 47.8 29.5 - 62.1 %   MCV 89.0 80.0 - 100.0 fL   MCH 30.0 26.0 - 34.0 pg   MCHC 33.7 30.0 - 36.0 g/dL   RDW 30.8 65.7 - 84.6 %   Platelets 251 150 - 400 K/uL   nRBC 0.0 0.0 - 0.2 %   Neutrophils Relative % 70 %   Neutro Abs 5.9 1.7 - 7.7 K/uL   Lymphocytes Relative 21 %   Lymphs Abs 1.8 0.7 - 4.0 K/uL   Monocytes Relative 7 %   Monocytes Absolute 0.6 0.1 - 1.0 K/uL   Eosinophils Relative 1 %   Eosinophils Absolute 0.1 0.0 - 0.5 K/uL   Basophils Relative 1 %   Basophils Absolute 0.1 0.0 - 0.1 K/uL   Immature Granulocytes 0 %   Abs Immature  Granulocytes 0.01 0.00 - 0.07 K/uL    Comment: Performed at [facility], [address], [place], [place] [address]  Basic metabolic panel     Status: Abnormal   Collection Time: 09/13/22  9:54 PM  Result Value Ref Range   Sodium 137 135 - 145 mmol/L   Potassium 3.7 3.5 - 5.1 mmol/L   Chloride 103 98 - 111 mmol/L   CO2 24 22 - 32 mmol/L   Glucose, Bld 144 (H) 70 - 99 mg/dL    Comment: Glucose reference range applies only to samples taken after fasting for at least 8 hours.   BUN 17 6 - 20 mg/dL   Creatinine, Ser 6.04 0.44 - 1.00 mg/dL   Calcium 8.8 (L) 8.9 - 10.3 mg/dL   GFR, Estimated >54 >09 mL/min    Comment: (NOTE) Calculated using the CKD-EPI Creatinine Equation (2021)    Anion gap 10 5 - 15    Comment: Performed at [facility], [address] [place], [place] [address]   I-stat chem 8, ED (not at [facility], [facility] or [facility])     Status: Abnormal   Collection Time: 09/13/22  9:59 PM  Result Value Ref Range   Sodium 139 135 - 145 mmol/L   Potassium 3.7 3.5 - 5.1 mmol/L   Chloride 104 98 - 111 mmol/L   BUN 17 6 - 20 mg/dL   Creatinine, Ser 4.78 0.44 - 1.00 mg/dL   Glucose, Bld 295 (H) 70 - 99 mg/dL    Comment: Glucose reference range applies only to samples taken after fasting for at least 8 hours.   Calcium, Ion 1.18 1.15 - 1.40 mmol/L   TCO2 24 22 - 32 mmol/L   Hemoglobin 13.9 12.0 - 15.0 g/dL   HCT 62.1 30.8 - 65.7 %   CT Ankle Left Wo Contrast  Result Date: 09/13/2022 CLINICAL DATA:  Acute ankle fracture EXAM: CT OF THE LEFT ANKLE WITHOUT CONTRAST TECHNIQUE: Multidetector CT imaging of the left ankle was performed according to the standard protocol. Multiplanar CT image reconstructions were also generated. RADIATION DOSE REDUCTION: This exam was performed according to the departmental dose-optimization program which includes automated exposure control, adjustment of the mA and/or kV according to patient size and/or use of iterative reconstruction technique. COMPARISON:  Radiographs 09/13/2022 FINDINGS: Bones/Joint/Cartilage Mixed appearance with elements of stage 2Weber A and stage 4 Weber B fracture of the ankle is identified with a transverse lateral malleolar component and a primarily oblique/longitudinal medial malleolar component as shown on image 49 series 6. There is some additional comminution including a transverse medial malleolar tip component as well as an oblique posterior malleolar component. This fracture pattern is unstable. A 6 mm free cortical fragment sits in the joint anterior to the talar dome on image 32 series 7. Degenerative subcortical cyst formation noted between the navicular and the middle cuneiform dorsally. Ligaments Suboptimally assessed by CT. Muscles and Tendons Fat density potentially reflecting lipoma noted in the distal extensor digitorum  longus muscle on image 1 series 5. No flexor tendon entrapment.  Achilles tendon intact. Soft tissues Circumferential subcutaneous edema in the ankle and tracking into the dorsum of foot. IMPRESSION: 1. Mixed features of stage 2 Weber A and stage 4 Weber B fracture of the ankle as detailed above, with a 6 mm free cortical fragment in the joint anterior to the talar dome. The fracture pattern is unstable. 2. Circumferential subcutaneous edema in the ankle and tracking into the dorsum of  foot. 3. Fat density potentially reflecting lipoma in the distal extensor digitorum longus muscle. Electronically Signed   By: Gaylyn Rong M.D.   On: 09/13/2022 20:54   DG Ankle Complete Left  Result Date: 09/13/2022 Please see detailed radiograph report in office note.   Pending Labs Unresulted Labs (From admission, onward)     Start     Ordered   09/13/22 2340  Protime-INR  Once,   R        09/13/22 2339   Signed and Held  HIV Antibody (routine testing w rflx)  (HIV Antibody (Routine testing w reflex) panel)  Once,   R        Signed and Held            Vitals/Pain Today's Vitals   09/13/22 1832 09/13/22 1904 09/13/22 2223 09/13/22 2317  BP:  (!) 154/89  134/80  Pulse:  76  85  Resp:  16  19  Temp:  98.5 F (36.9 C)    TempSrc:  Oral    SpO2:  100%  99%  PainSc: 3   0-No pain     Isolation Precautions No active isolations  Medications Medications - No data to display  Mobility non-ambulatory     Focused Assessments N/A   R Recommendations: See Admitting Provider Note  Report given to:   Additional Notes: N/A

## 2022-09-13 NOTE — ED Provider Notes (Signed)
[place] EMERGENCY DEPARTMENT AT [facility] Provider Note   CSN: [id number] Arrival date & time: 09/13/22  1824     History  Chief Complaint  Patient presents with   Ankle Pain    SHUNDRA BRENT is a 62 y.o. female.  Patient is a 62 year old female with a history of dementia who had a fall on 09/05/2022 and was found to have a tibia and fibular fracture of the ankle and was placed in a splint and was supposed to follow-up and be nonweightbearing.  Patient went to podiatry today and has been having a really hard time.  Her husband reports she really cannot get up at all because of being nonweightbearing it is really difficult.  Podiatry felt that patient was going to require surgery and outpatient was going to be very difficult for her.  She was sent here for an orthopedic evaluation and CT of ankle.  The history is provided by the patient, the spouse and medical records.  Ankle Pain      Home Medications Prior to Admission medications   Medication Sig Start Date End Date Taking? Authorizing Provider  donepezil (ARICEPT) 10 MG tablet TAKE 1 TABLET BY MOUTH AT BEDTIME 12/12/21   Lomax, Amy, NP  ezetimibe (ZETIA) 10 MG tablet Take 10 mg by mouth daily. 12/14/20   [provider]  HYDROcodone-acetaminophen (NORCO/VICODIN) 5-325 MG tablet Take 2 tablets by mouth every 4 (four) hours as needed. 09/05/22   Kommor, Madison, MD  memantine (NAMENDA) 10 MG tablet Take 1 tablet (10 mg total) by mouth 2 (two) times daily. 10/25/21   Lomax, Amy, NP  Multiple Vitamins-Minerals (MULTIVITAMIN WOMEN) TABS Take 1 tablet by mouth daily in the afternoon.    [provider]  naproxen (NAPROSYN) 375 MG tablet Take 1 tablet (375 mg total) by mouth 2 (two) times daily. 09/05/22   Kommor, Madison, MD      Allergies    Lipitor [atorvastatin calcium] and Codeine    Review of Systems   Review of Systems  Physical Exam Updated Vital Signs BP (!) 154/89   Pulse 76   Temp 98.5 F  (36.9 C) (Oral)   Resp 16   SpO2 100%  Physical Exam Vitals and nursing note reviewed.  Cardiovascular:     Rate and Rhythm: Normal rate.  Pulmonary:     Effort: Pulmonary effort is normal.  Musculoskeletal:        General: Tenderness present.     Left lower leg: Edema present.     Comments: Splint is present on the left lower leg.  Edema is also present.  Normal capillary refill in the toes.  Neurological:     Mental Status: She is alert.     ED Results / Procedures / Treatments   Labs (all labs ordered are listed, but only abnormal results are displayed) Labs Reviewed  BASIC METABOLIC PANEL - Abnormal; Notable for the following components:      Result Value   Glucose, Bld 144 (*)    Calcium 8.8 (*)    All other components within normal limits  I-STAT CHEM 8, ED - Abnormal; Notable for the following components:   Glucose, Bld 140 (*)    All other components within normal limits  CBC WITH DIFFERENTIAL/PLATELET    EKG None  Radiology CT Ankle Left Wo Contrast  Result Date: 09/13/2022 CLINICAL DATA:  Acute ankle fracture EXAM: CT OF THE LEFT ANKLE WITHOUT CONTRAST TECHNIQUE: Multidetector CT imaging of the left  ankle was performed according to the standard protocol. Multiplanar CT image reconstructions were also generated. RADIATION DOSE REDUCTION: This exam was performed according to the departmental dose-optimization program which includes automated exposure control, adjustment of the mA and/or kV according to patient size and/or use of iterative reconstruction technique. COMPARISON:  Radiographs 09/13/2022 FINDINGS: Bones/Joint/Cartilage Mixed appearance with elements of stage 2Weber A and stage 4 Weber B fracture of the ankle is identified with a transverse lateral malleolar component and a primarily oblique/longitudinal medial malleolar component as shown on image 49 series 6. There is some additional comminution including a transverse medial malleolar tip component as well  as an oblique posterior malleolar component. This fracture pattern is unstable. A 6 mm free cortical fragment sits in the joint anterior to the talar dome on image 32 series 7. Degenerative subcortical cyst formation noted between the navicular and the middle cuneiform dorsally. Ligaments Suboptimally assessed by CT. Muscles and Tendons Fat density potentially reflecting lipoma noted in the distal extensor digitorum longus muscle on image 1 series 5. No flexor tendon entrapment.  Achilles tendon intact. Soft tissues Circumferential subcutaneous edema in the ankle and tracking into the dorsum of foot. IMPRESSION: 1. Mixed features of stage 2 Weber A and stage 4 Weber B fracture of the ankle as detailed above, with a 6 mm free cortical fragment in the joint anterior to the talar dome. The fracture pattern is unstable. 2. Circumferential subcutaneous edema in the ankle and tracking into the dorsum of foot. 3. Fat density potentially reflecting lipoma in the distal extensor digitorum longus muscle. Electronically Signed   By: Gaylyn Rong M.D.   On: 09/13/2022 20:54   DG Ankle Complete Left  Result Date: 09/13/2022 Please see detailed radiograph report in office note.   Procedures Procedures    Medications Ordered in ED Medications - No data to display  ED Course/ Medical Decision Making/ A&P                                 Medical Decision Making Amount and/or Complexity of Data Reviewed Independent Historian: spouse External Data Reviewed: notes. Labs: ordered. Decision-making details documented in ED Course. Radiology: ordered and independent interpretation performed. Decision-making details documented in ED Course.  Risk Decision regarding hospitalization.   Patient is presenting here from podiatry due to an ankle fracture and podiatry felt that patient was going to need surgical repair but due to her social situation needed to come to the hospital to have it done sooner than later  as he did not feel that she was doing well as an outpatient.  Patient's fracture was 8 days ago and she was supposed to follow-up with Dr. Dallas Schimke in the office but did not.  Patient has not had any further falls but her husband said she is really not getting up at all.  CT of the ankle pending and then will discuss with orthopedics.  10:26 PM CT shows a trimalleolus fracture.  Spoke with Dr. Christell Constant who agrees that patient will need surgery.  Given patient's decline at home will admit for surgery, PT and most likely rehab.  Discussed with the patient and her husband.  They are comfortable with this plan.  I independently interpreted patient's labs and CBC and BMP are normal.  Will admit to hospitalist for surgical clearance        Final Clinical Impression(s) / ED Diagnoses Final diagnoses:  Trimalleolar fracture of ankle,  closed, left, sequela    Rx / DC Orders ED Discharge Orders     None         Gwyneth Sprout, MD 09/13/22 2226

## 2022-09-13 NOTE — ED Notes (Addendum)
Milas Gain- daughter-in law would like patient updates and inform the admitting doc of medical hx.

## 2022-09-14 ENCOUNTER — Encounter (HOSPITAL_COMMUNITY): Payer: Self-pay | Admitting: Family Medicine

## 2022-09-14 ENCOUNTER — Other Ambulatory Visit: Payer: Self-pay

## 2022-09-14 ENCOUNTER — Inpatient Hospital Stay (HOSPITAL_COMMUNITY): Payer: Medicare Other | Admitting: Anesthesiology

## 2022-09-14 ENCOUNTER — Inpatient Hospital Stay (HOSPITAL_COMMUNITY): Payer: Medicare Other

## 2022-09-14 ENCOUNTER — Encounter (HOSPITAL_COMMUNITY)
Admission: EM | Disposition: A | Discharge status: Skilled Nursing Facility | Payer: Self-pay | Source: Home / Self Care | Attending: Internal Medicine

## 2022-09-14 DIAGNOSIS — S82852S Displaced trimalleolar fracture of left lower leg, sequela: Secondary | ICD-10-CM

## 2022-09-14 DIAGNOSIS — S82892A Other fracture of left lower leg, initial encounter for closed fracture: Secondary | ICD-10-CM | POA: Diagnosis not present

## 2022-09-14 DIAGNOSIS — S82852A Displaced trimalleolar fracture of left lower leg, initial encounter for closed fracture: Secondary | ICD-10-CM

## 2022-09-14 HISTORY — PX: ORIF ANKLE FRACTURE: SHX5408

## 2022-09-14 LAB — HIV ANTIBODY (ROUTINE TESTING W REFLEX): HIV Screen 4th Generation wRfx: NONREACTIVE

## 2022-09-14 LAB — SURGICAL PCR SCREEN
MRSA, PCR: NEGATIVE
Staphylococcus aureus: POSITIVE — AB

## 2022-09-14 LAB — PROTIME-INR
INR: 1.2 (ref 0.8–1.2)
Prothrombin Time: 14.9 s (ref 11.4–15.2)

## 2022-09-14 SURGERY — OPEN REDUCTION INTERNAL FIXATION (ORIF) ANKLE FRACTURE
Anesthesia: General | Site: Ankle | Laterality: Left

## 2022-09-14 MED ORDER — HYDROCODONE-ACETAMINOPHEN 7.5-325 MG PO TABS
1.0000 | ORAL_TABLET | ORAL | Status: DC | PRN
  Administered 2022-09-17 – 2022-09-18 (×2): 1 via ORAL
  Filled 2022-09-14 (×2): qty 1

## 2022-09-14 MED ORDER — FENTANYL CITRATE (PF) 250 MCG/5ML IJ SOLN
INTRAMUSCULAR | Status: AC
  Filled 2022-09-14: qty 5

## 2022-09-14 MED ORDER — AMISULPRIDE (ANTIEMETIC) 5 MG/2ML IV SOLN: 10.0000 mg | Freq: Once | INTRAVENOUS | Status: DC | PRN

## 2022-09-14 MED ORDER — MIDAZOLAM HCL 2 MG/2ML IJ SOLN
INTRAMUSCULAR | Status: AC
  Filled 2022-09-14: qty 2

## 2022-09-14 MED ORDER — ADULT MULTIVITAMIN W/MINERALS CH
1.0000 | ORAL_TABLET | Freq: Every day | ORAL | Status: DC
  Administered 2022-09-14 – 2022-09-20 (×7): 1 via ORAL
  Filled 2022-09-14 (×7): qty 1

## 2022-09-14 MED ORDER — CHLORHEXIDINE GLUCONATE 0.12 % MT SOLN
15.0000 mL | Freq: Once | OROMUCOSAL | Status: AC
  Administered 2022-09-14: 15 mL via OROMUCOSAL

## 2022-09-14 MED ORDER — HYDROCODONE-ACETAMINOPHEN 5-325 MG PO TABS: 2.0000 | ORAL_TABLET | ORAL | Status: DC | PRN

## 2022-09-14 MED ORDER — FENTANYL CITRATE (PF) 250 MCG/5ML IJ SOLN
INTRAMUSCULAR | Status: DC | PRN
  Administered 2022-09-14 (×2): 50 ug via INTRAVENOUS

## 2022-09-14 MED ORDER — ONDANSETRON HCL 4 MG PO TABS: 4.0000 mg | ORAL_TABLET | Freq: Four times a day (QID) | ORAL | Status: DC | PRN

## 2022-09-14 MED ORDER — EZETIMIBE 10 MG PO TABS
10.0000 mg | ORAL_TABLET | Freq: Every day | ORAL | Status: DC
  Administered 2022-09-14 – 2022-09-20 (×7): 10 mg via ORAL
  Filled 2022-09-14 (×7): qty 1

## 2022-09-14 MED ORDER — HYDROCODONE-ACETAMINOPHEN 5-325 MG PO TABS
1.0000 | ORAL_TABLET | ORAL | Status: DC | PRN
  Administered 2022-09-14 – 2022-09-15 (×2): 2 via ORAL
  Filled 2022-09-14 (×2): qty 2

## 2022-09-14 MED ORDER — FLEET ENEMA RE ENEM: 1.0000 | ENEMA | Freq: Once | RECTAL | Status: DC | PRN

## 2022-09-14 MED ORDER — CHLORHEXIDINE GLUCONATE 4 % EX SOLN: 60.0000 mL | Freq: Once | CUTANEOUS | Status: DC

## 2022-09-14 MED ORDER — MEPERIDINE HCL 25 MG/ML IJ SOLN: 6.2500 mg | INTRAMUSCULAR | Status: DC | PRN

## 2022-09-14 MED ORDER — DEXMEDETOMIDINE HCL IN NACL 200 MCG/50ML IV SOLN
INTRAVENOUS | Status: DC | PRN
Start: 2022-09-14 — End: 2022-09-14
  Administered 2022-09-14: 12 ug via INTRAVENOUS

## 2022-09-14 MED ORDER — OXYCODONE HCL 5 MG PO TABS: 5.0000 mg | ORAL_TABLET | Freq: Once | ORAL | Status: DC | PRN

## 2022-09-14 MED ORDER — ONDANSETRON HCL 4 MG/2ML IJ SOLN
4.0000 mg | Freq: Four times a day (QID) | INTRAMUSCULAR | Status: DC | PRN
  Administered 2022-09-15: 4 mg via INTRAVENOUS
  Filled 2022-09-14: qty 2

## 2022-09-14 MED ORDER — ORAL CARE MOUTH RINSE: 15.0000 mL | Freq: Once | OROMUCOSAL | Status: AC

## 2022-09-14 MED ORDER — EPHEDRINE SULFATE (PRESSORS) 50 MG/ML IJ SOLN
INTRAMUSCULAR | Status: DC | PRN
Start: 2022-09-14 — End: 2022-09-14
  Administered 2022-09-14: 10 mg via INTRAVENOUS

## 2022-09-14 MED ORDER — CEFAZOLIN SODIUM-DEXTROSE 1-4 GM/50ML-% IV SOLN
1.0000 g | Freq: Four times a day (QID) | INTRAVENOUS | Status: AC
  Administered 2022-09-14 – 2022-09-15 (×3): 1 g via INTRAVENOUS
  Filled 2022-09-14 (×3): qty 50

## 2022-09-14 MED ORDER — MEMANTINE HCL 10 MG PO TABS
10.0000 mg | ORAL_TABLET | Freq: Two times a day (BID) | ORAL | Status: DC
  Administered 2022-09-14 – 2022-09-20 (×13): 10 mg via ORAL
  Filled 2022-09-14 (×13): qty 1

## 2022-09-14 MED ORDER — MUPIROCIN 2 % EX OINT
1.0000 | TOPICAL_OINTMENT | Freq: Two times a day (BID) | CUTANEOUS | Status: AC
  Administered 2022-09-14 – 2022-09-19 (×10): 1 via NASAL
  Filled 2022-09-14 (×3): qty 22

## 2022-09-14 MED ORDER — OXYCODONE HCL 5 MG/5ML PO SOLN: 5.0000 mg | Freq: Once | ORAL | Status: DC | PRN

## 2022-09-14 MED ORDER — CEFAZOLIN SODIUM-DEXTROSE 2-4 GM/100ML-% IV SOLN
2.0000 g | INTRAVENOUS | Status: AC
  Administered 2022-09-14: 2 g via INTRAVENOUS

## 2022-09-14 MED ORDER — HYDROMORPHONE HCL 1 MG/ML IJ SOLN
INTRAMUSCULAR | Status: AC
  Filled 2022-09-14: qty 1

## 2022-09-14 MED ORDER — ROSUVASTATIN CALCIUM 5 MG PO TABS
10.0000 mg | ORAL_TABLET | Freq: Every day | ORAL | Status: DC
  Administered 2022-09-14 – 2022-09-19 (×6): 10 mg via ORAL
  Filled 2022-09-14 (×7): qty 2

## 2022-09-14 MED ORDER — 0.9 % SODIUM CHLORIDE (POUR BTL) OPTIME
TOPICAL | Status: DC | PRN
  Administered 2022-09-14: 1000 mL

## 2022-09-14 MED ORDER — LACTATED RINGERS IV SOLN: INTRAVENOUS | Status: DC

## 2022-09-14 MED ORDER — METOCLOPRAMIDE HCL 5 MG/ML IJ SOLN: 5.0000 mg | Freq: Three times a day (TID) | INTRAMUSCULAR | Status: DC | PRN

## 2022-09-14 MED ORDER — METHOCARBAMOL 500 MG PO TABS
500.0000 mg | ORAL_TABLET | Freq: Four times a day (QID) | ORAL | Status: DC | PRN
  Administered 2022-09-16 – 2022-09-17 (×2): 500 mg via ORAL
  Filled 2022-09-14 (×2): qty 1

## 2022-09-14 MED ORDER — BISACODYL 10 MG RE SUPP: 10.0000 mg | Freq: Every day | RECTAL | Status: DC | PRN

## 2022-09-14 MED ORDER — MAGNESIUM CITRATE PO SOLN: 1.0000 | Freq: Once | ORAL | Status: DC | PRN

## 2022-09-14 MED ORDER — LIDOCAINE 2% (20 MG/ML) 5 ML SYRINGE
INTRAMUSCULAR | Status: DC | PRN
  Administered 2022-09-14: 30 mg via INTRAVENOUS

## 2022-09-14 MED ORDER — METHOCARBAMOL 1000 MG/10ML IJ SOLN: 500.0000 mg | Freq: Four times a day (QID) | INTRAVENOUS | Status: DC | PRN

## 2022-09-14 MED ORDER — BISACODYL 5 MG PO TBEC: 5.0000 mg | DELAYED_RELEASE_TABLET | Freq: Every day | ORAL | Status: DC | PRN

## 2022-09-14 MED ORDER — POVIDONE-IODINE 10 % EX SWAB
2.0000 | Freq: Once | CUTANEOUS | Status: AC
  Administered 2022-09-14: 2 via TOPICAL

## 2022-09-14 MED ORDER — FENTANYL CITRATE (PF) 100 MCG/2ML IJ SOLN
INTRAMUSCULAR | Status: AC
  Filled 2022-09-14: qty 2

## 2022-09-14 MED ORDER — METOCLOPRAMIDE HCL 5 MG PO TABS: 5.0000 mg | ORAL_TABLET | Freq: Three times a day (TID) | ORAL | Status: DC | PRN

## 2022-09-14 MED ORDER — HYDROMORPHONE HCL 1 MG/ML IJ SOLN
0.2500 mg | INTRAMUSCULAR | Status: DC | PRN
  Administered 2022-09-14: 0.5 mg via INTRAVENOUS

## 2022-09-14 MED ORDER — CEFAZOLIN SODIUM-DEXTROSE 2-4 GM/100ML-% IV SOLN
INTRAVENOUS | Status: AC
  Filled 2022-09-14: qty 100

## 2022-09-14 MED ORDER — DONEPEZIL HCL 10 MG PO TABS
10.0000 mg | ORAL_TABLET | Freq: Every day | ORAL | Status: DC
  Administered 2022-09-14 – 2022-09-19 (×6): 10 mg via ORAL
  Filled 2022-09-14 (×6): qty 1

## 2022-09-14 MED ORDER — ENSURE ENLIVE PO LIQD
237.0000 mL | Freq: Two times a day (BID) | ORAL | Status: DC
  Administered 2022-09-14 – 2022-09-20 (×7): 237 mL via ORAL

## 2022-09-14 MED ORDER — PROPOFOL 10 MG/ML IV BOLUS
INTRAVENOUS | Status: DC | PRN
  Administered 2022-09-14: 200 mg via INTRAVENOUS

## 2022-09-14 MED ORDER — SENNOSIDES-DOCUSATE SODIUM 8.6-50 MG PO TABS: 1.0000 | ORAL_TABLET | Freq: Every evening | ORAL | Status: DC | PRN

## 2022-09-14 MED ORDER — MORPHINE SULFATE (PF) 2 MG/ML IV SOLN: 0.5000 mg | INTRAVENOUS | Status: DC | PRN

## 2022-09-14 MED ORDER — HYDROMORPHONE HCL 1 MG/ML IJ SOLN: 0.5000 mg | INTRAMUSCULAR | Status: DC | PRN

## 2022-09-14 MED ORDER — POLYETHYLENE GLYCOL 3350 17 G PO PACK: 17.0000 g | PACK | Freq: Every day | ORAL | Status: DC | PRN

## 2022-09-14 MED ORDER — PROMETHAZINE HCL 25 MG/ML IJ SOLN: 6.2500 mg | INTRAMUSCULAR | Status: DC | PRN

## 2022-09-14 MED ORDER — SODIUM CHLORIDE 0.9 % IV SOLN: INTRAVENOUS | Status: DC

## 2022-09-14 MED ORDER — ACETAMINOPHEN 325 MG PO TABS
325.0000 mg | ORAL_TABLET | Freq: Four times a day (QID) | ORAL | Status: DC | PRN
  Administered 2022-09-15 (×2): 650 mg via ORAL
  Filled 2022-09-14 (×2): qty 2

## 2022-09-14 MED ORDER — ONDANSETRON HCL 4 MG/2ML IJ SOLN
INTRAMUSCULAR | Status: DC | PRN
  Administered 2022-09-14: 4 mg via INTRAVENOUS

## 2022-09-14 MED ORDER — CHLORHEXIDINE GLUCONATE CLOTH 2 % EX PADS
6.0000 | MEDICATED_PAD | Freq: Every day | CUTANEOUS | Status: AC
  Administered 2022-09-15 – 2022-09-19 (×3): 6 via TOPICAL

## 2022-09-14 MED ORDER — DOCUSATE SODIUM 100 MG PO CAPS
100.0000 mg | ORAL_CAPSULE | Freq: Two times a day (BID) | ORAL | Status: DC
  Administered 2022-09-14 – 2022-09-20 (×12): 100 mg via ORAL
  Filled 2022-09-14 (×12): qty 1

## 2022-09-14 SURGICAL SUPPLY — 72 items
ALCOHOL 70% 16 OZ (MISCELLANEOUS) ×1 IMPLANT
APL PRP STRL LF DISP 70% ISPRP (MISCELLANEOUS)
BAG COUNTER SPONGE SURGICOUNT (BAG) ×1 IMPLANT
BAG SPNG CNTER NS LX DISP (BAG) ×1
BANDAGE ESMARK 6X9 LF (GAUZE/BANDAGES/DRESSINGS) IMPLANT
BIT DRILL 110X2.5XQCK CNCT (BIT) IMPLANT
BIT DRILL 2.5 (BIT) ×1
BIT DRL 110X2.5XQCK CNCT (BIT) ×1
BLADE SURG 15 STRL LF DISP TIS (BLADE) ×1 IMPLANT
BLADE SURG 15 STRL SS (BLADE)
BLADE SURG 21 STRL SS (BLADE) IMPLANT
BNDG CMPR 5X4 CHSV STRCH STRL (GAUZE/BANDAGES/DRESSINGS) ×1
BNDG CMPR 5X6 CHSV STRCH STRL (GAUZE/BANDAGES/DRESSINGS)
BNDG CMPR 9X6 STRL LF SNTH (GAUZE/BANDAGES/DRESSINGS)
BNDG CMPR MED 10X6 ELC LF (GAUZE/BANDAGES/DRESSINGS)
BNDG COHESIVE 4X5 TAN STRL (GAUZE/BANDAGES/DRESSINGS) IMPLANT
BNDG COHESIVE 4X5 TAN STRL LF (GAUZE/BANDAGES/DRESSINGS) IMPLANT
BNDG COHESIVE 6X5 TAN ST LF (GAUZE/BANDAGES/DRESSINGS) IMPLANT
BNDG ELASTIC 6X10 VLCR STRL LF (GAUZE/BANDAGES/DRESSINGS) ×1 IMPLANT
BNDG ESMARK 6X9 LF (GAUZE/BANDAGES/DRESSINGS)
BNDG GAUZE DERMACEA FLUFF 4 (GAUZE/BANDAGES/DRESSINGS) IMPLANT
BNDG GZE DERMACEA 4 6PLY (GAUZE/BANDAGES/DRESSINGS) ×1
CANISTER SUCT 3000ML PPV (MISCELLANEOUS) ×1 IMPLANT
CHLORAPREP W/TINT 26 (MISCELLANEOUS) ×2 IMPLANT
COVER SURGICAL LIGHT HANDLE (MISCELLANEOUS) ×1 IMPLANT
CUFF TOURN SGL QUICK 34 (TOURNIQUET CUFF)
CUFF TOURN SGL QUICK 42 (TOURNIQUET CUFF) IMPLANT
CUFF TRNQT CYL 34X4.125X (TOURNIQUET CUFF) ×1 IMPLANT
DRAPE OEC MINIVIEW 54X84 (DRAPES) ×1 IMPLANT
DRAPE U-SHAPE 47X51 STRL (DRAPES) ×1 IMPLANT
DRSG ADAPTIC 3X8 NADH LF (GAUZE/BANDAGES/DRESSINGS) IMPLANT
DRSG MEPITEL 4X7.2 (GAUZE/BANDAGES/DRESSINGS) ×1 IMPLANT
DRSG XEROFORM 1X8 (GAUZE/BANDAGES/DRESSINGS) ×1 IMPLANT
ELECT REM PT RETURN 9FT ADLT (ELECTROSURGICAL) ×1
ELECTRODE REM PT RTRN 9FT ADLT (ELECTROSURGICAL) ×1 IMPLANT
GAUZE PAD ABD 8X10 STRL (GAUZE/BANDAGES/DRESSINGS) ×2 IMPLANT
GAUZE SPONGE 4X4 12PLY STRL (GAUZE/BANDAGES/DRESSINGS) IMPLANT
GAUZE SPONGE 4X4 12PLY STRL LF (GAUZE/BANDAGES/DRESSINGS) ×1 IMPLANT
GLOVE BIOGEL M STRL SZ7.5 (GLOVE) ×1 IMPLANT
GLOVE BIOGEL PI IND STRL 8 (GLOVE) ×1 IMPLANT
GLOVE SRG 8 PF TXTR STRL LF DI (GLOVE) ×1 IMPLANT
GLOVE SURG ENC TEXT LTX SZ7.5 (GLOVE) ×1 IMPLANT
GLOVE SURG UNDER POLY LF SZ8 (GLOVE) ×1
GOWN STRL REUS W/ TWL LRG LVL3 (GOWN DISPOSABLE) ×1 IMPLANT
GOWN STRL REUS W/ TWL XL LVL3 (GOWN DISPOSABLE) ×2 IMPLANT
GOWN STRL REUS W/TWL LRG LVL3 (GOWN DISPOSABLE) ×1
GOWN STRL REUS W/TWL XL LVL3 (GOWN DISPOSABLE) ×2
KIT BASIN OR (CUSTOM PROCEDURE TRAY) ×1 IMPLANT
KIT TURNOVER KIT B (KITS) ×1 IMPLANT
NS IRRIG 1000ML POUR BTL (IV SOLUTION) ×1 IMPLANT
PACK ORTHO EXTREMITY (CUSTOM PROCEDURE TRAY) ×1 IMPLANT
PAD ARMBOARD 7.5X6 YLW CONV (MISCELLANEOUS) ×2 IMPLANT
PAD CAST 4YDX4 CTTN HI CHSV (CAST SUPPLIES) ×1 IMPLANT
PADDING CAST COTTON 4X4 STRL (CAST SUPPLIES)
PLATE 7HOLE 1/3 TUBULAR (Plate) IMPLANT
PLATE SMALL FRAG 3.5X49 4H (Plate) IMPLANT
SCREW CORT 2.5X30X3.5 SM (Screw) IMPLANT
SCREW CORT ST 3.5X40MM (Screw) IMPLANT
SCREW CORTICAL 3.5 16MM (Screw) IMPLANT
SCREW CORTICAL 3.5X12 (Screw) IMPLANT
SCREW CORTICAL 3.5X14 (Screw) IMPLANT
SCREW CORTICAL 3.5X30MM (Screw) ×1 IMPLANT
SPONGE T-LAP 18X18 ~~LOC~~+RFID (SPONGE) ×1 IMPLANT
SUCTION TUBE FRAZIER 10FR DISP (SUCTIONS) ×1 IMPLANT
SUT ETHILON 2 0 PSLX (SUTURE) IMPLANT
SUT ETHILON 3 0 PS 1 (SUTURE) ×1 IMPLANT
SUT MNCRL AB 3-0 PS2 27 (SUTURE) ×1 IMPLANT
SUT VIC AB 2-0 CT1 27 (SUTURE)
SUT VIC AB 2-0 CT1 TAPERPNT 27 (SUTURE) ×2 IMPLANT
TOWEL GREEN STERILE (TOWEL DISPOSABLE) ×1 IMPLANT
TOWEL GREEN STERILE FF (TOWEL DISPOSABLE) ×1 IMPLANT
TUBE CONNECTING 12X1/4 (SUCTIONS) ×1 IMPLANT

## 2022-09-14 NOTE — Consult Note (Signed)
Orthopedic Surgery Consult Note  Assessment: Patient is a 62 y.o. female with left trimalleolar ankle fracture   Plan: -Planning for operative fixation today with Dr. Lajoyce Corners -Diet: NPO for procedure -Antibiotics: ancef on call to OR -TXA on call to OR -Weight bearing status: NWB LLE in splint -PT evaluate and treat post-operatively -Pain control -Dispo: pending completion of operative plans  ___________________________________________________________________________   Chief complaint: left ankle pain  History:  Patient is a 62 y.o. female who fell in her yard a little over a week ago. She noted ankle pain at the time. Was brought to [facility] where she was splinted and instructed to be non-weight bearing. She never made it to the orthopedists office, so she went to see a podiatrist yesterday since she was having trouble managing at home with the weight bearing restrictions. Was brought to [facility] yesterday as a result. Was admitted to a medicine service last night.   Past Medical History:  Diagnosis Date   History of substance abuse    Briefly spent time in substance abuse rehabilitation treatment as a teenager. Cocaine likely represented the primary drug of abuse.    Hyperlipidemia    "I used to take med. for cholesteol, but only for a short time"   Major neurocognitive disorder 05/28/2019   Menopausal syndrome    Rheumatoid arthritis    Lumbar - spondylisthesis   S/P lumbar spinal fusion 08/15/2016   Vitamin D deficiency      Allergies: lipitor, codeine   Past Surgical History:  Procedure Laterality Date   ABDOMINAL HYSTERECTOMY     TUBAL LIGATION     VAGINAL DELIVERY     x2    Social history: Has a history of drug abuse, but no longer actively suing recreational drugs Former smoker  Family history: -reviewed and not pertinent to ankle fracture   Physical Exam:  General: no acute distress, appears stated age Neurologic: alert, answering questions  appropriately, following commands Cardiovascular: regular rate, no cyanosis Respiratory: unlabored breathing on room air, symmetric chest rise Psychiatric: appropriate affect, normal cadence to speech  MSK:   -Bilateral upper extremities  No tenderness to palpation over extremity, no gross deformity Fires deltoid, biceps, triceps, wrist extensors, wrist flexors, finger extensors, finger flexors  AIN/PIN/IO intact  Palpable radial pulse  Sensation intact to light touch in median/ulnar/radial/axillary nerve distributions  Hand warm and well perfused  -Right lower extremity  No tenderness to palpation over extremity, no gross deformity, no open wounds Fires hip flexors, quadriceps, hamstrings, tibialis anterior, gastrocnemius and soleus, extensor hallucis longus Plantarflexes and dorsiflexes toes Sensation intact to light touch in sural, saphenous, tibial, deep peroneal, and superficial peroneal nerve distributions Foot warm and well perfused  -Left lower extremity  No tenderness to palpation over the exposed extremity, leg in splint, no gross deformity seen but limited by splint Fires hip flexors, quadriceps, hamstrings, extensor hallucis longus Does not fire TA or GSC due to pain Plantarflexes and dorsiflexes toes Sensation intact to light touch in sural, saphenous, tibial, deep peroneal, and superficial peroneal nerve distributions Foot warm and well perfused  Imaging: CT of the left ankle from 09/14/2022 was independently reviewed and interpreted, showing a trimalleolar ankle fracture that is minimally displaced. No other fractures seen.    Patient name: Donna Jacobs Patient MRN: [id number] Date: 09/14/22

## 2022-09-14 NOTE — Consult Note (Addendum)
ORTHOPAEDIC CONSULTATION  REQUESTING PHYSICIAN: Loyce Dys, MD  Chief Complaint: Left ankle fracture  HPI: Donna Jacobs is a 62 y.o. female  who fell in her yard a little over a week ago. She noted ankle pain at the time. Was brought to [facility] where she was splinted and instructed to be non-weight bearing. She never made it to the orthopedists office, so she went to see a podiatrist yesterday since she was having trouble managing at home with the weight bearing restrictions. Was brought to [facility] yesterday as a result. Was admitted to a medicine service last night.   Past Medical History:  Diagnosis Date   History of substance abuse    Briefly spent time in substance abuse rehabilitation treatment as a teenager. Cocaine likely represented the primary drug of abuse.    Hyperlipidemia    "I used to take med. for cholesteol, but only for a short time"   Major neurocognitive disorder 05/28/2019   Menopausal syndrome    Rheumatoid arthritis    Lumbar - spondylisthesis   S/P lumbar spinal fusion 08/15/2016   Vitamin D deficiency    Past Surgical History:  Procedure Laterality Date   ABDOMINAL HYSTERECTOMY     TUBAL LIGATION     VAGINAL DELIVERY     x2   Social History   Socioeconomic History   Marital status: Married    Spouse name: Not on file   Number of children: Not on file   Years of education: 12   Highest education level: High school graduate  Occupational History   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[employer]  Tobacco Use   Smoking status: Former    Current packs/day: 0.00    Types: Cigarettes    Quit date: 08/10/1975    Years since quitting: 47.1   Smokeless tobacco: Never  Substance and Sexual Activity   Alcohol use: Yes    Comment: very rarely   Drug use: Not Currently    Types: Cocaine, Marijuana   Sexual activity: Not on file  Other Topics Concern   Not on file  Social History Narrative   Not on file   Social Determinants  of Health   Financial Resource Strain: Not on file  Food Insecurity: Not on file  Transportation Needs: Not on file  Physical Activity: Not on file  Stress: Not on file  Social Connections: Not on file   Family History  Problem Relation Age of Onset   Alzheimer's disease Paternal Grandmother    - negative except otherwise stated in the family history section Allergies  Allergen Reactions   Lipitor [Atorvastatin Calcium]    Codeine Nausea And Vomiting   Prior to Admission medications   Medication Sig Start Date End Date Taking? Authorizing Provider  donepezil (ARICEPT) 10 MG tablet TAKE 1 TABLET BY MOUTH AT BEDTIME 12/12/21   Lomax, Amy, NP  ezetimibe (ZETIA) 10 MG tablet Take 10 mg by mouth daily. 12/14/20   [provider]  HYDROcodone-acetaminophen (NORCO/VICODIN) 5-325 MG tablet Take 2 tablets by mouth every 4 (four) hours as needed. 09/05/22   Kommor, Madison, MD  memantine (NAMENDA) 10 MG tablet Take 1 tablet (10 mg total) by mouth 2 (two) times daily. 10/25/21   Lomax, Amy, NP  Multiple Vitamins-Minerals (MULTIVITAMIN WOMEN) TABS Take 1 tablet by mouth daily in the afternoon.    [provider]  naproxen (NAPROSYN) 375 MG tablet Take 1 tablet (375 mg total) by mouth  2 (two) times daily. 09/05/22   Glendora Score, MD   Chest Portable 1 View  Result Date: 09/14/2022 CLINICAL DATA:  Preoperative evaluation for ankle fracture. Altered mental status. EXAM: PORTABLE CHEST 1 VIEW COMPARISON:  Chest radiograph dated 08/09/2016 FINDINGS: Low lung volumes. Mild left basilar interstitial opacities. No pleural effusion or pneumothorax. The heart size and mediastinal contours are within normal limits. No acute osseous abnormality. Right upper quadrant surgical clips. IMPRESSION: Low lung volumes with mild left basilar interstitial opacities, which may represent mild pulmonary edema. Electronically Signed   By: Agustin Cree M.D.   On: 09/14/2022 08:25   CT Ankle Left Wo  Contrast  Result Date: 09/13/2022 CLINICAL DATA:  Acute ankle fracture EXAM: CT OF THE LEFT ANKLE WITHOUT CONTRAST TECHNIQUE: Multidetector CT imaging of the left ankle was performed according to the standard protocol. Multiplanar CT image reconstructions were also generated. RADIATION DOSE REDUCTION: This exam was performed according to the departmental dose-optimization program which includes automated exposure control, adjustment of the mA and/or kV according to patient size and/or use of iterative reconstruction technique. COMPARISON:  Radiographs 09/13/2022 FINDINGS: Bones/Joint/Cartilage Mixed appearance with elements of stage 2Weber A and stage 4 Weber B fracture of the ankle is identified with a transverse lateral malleolar component and a primarily oblique/longitudinal medial malleolar component as shown on image 49 series 6. There is some additional comminution including a transverse medial malleolar tip component as well as an oblique posterior malleolar component. This fracture pattern is unstable. A 6 mm free cortical fragment sits in the joint anterior to the talar dome on image 32 series 7. Degenerative subcortical cyst formation noted between the navicular and the middle cuneiform dorsally. Ligaments Suboptimally assessed by CT. Muscles and Tendons Fat density potentially reflecting lipoma noted in the distal extensor digitorum longus muscle on image 1 series 5. No flexor tendon entrapment.  Achilles tendon intact. Soft tissues Circumferential subcutaneous edema in the ankle and tracking into the dorsum of foot. IMPRESSION: 1. Mixed features of stage 2 Weber A and stage 4 Weber B fracture of the ankle as detailed above, with a 6 mm free cortical fragment in the joint anterior to the talar dome. The fracture pattern is unstable. 2. Circumferential subcutaneous edema in the ankle and tracking into the dorsum of foot. 3. Fat density potentially reflecting lipoma in the distal extensor digitorum longus  muscle. Electronically Signed   By: Gaylyn Rong M.D.   On: 09/13/2022 20:54   DG Ankle Complete Left  Result Date: 09/13/2022 Please see detailed radiograph report in office note.  - pertinent xrays, CT, MRI studies were reviewed and independently interpreted  Positive ROS: All other systems have been reviewed and were otherwise negative with the exception of those mentioned in the HPI and as above.  Physical Exam: General: Not alert, no acute distress Psychiatric: Patient is not competent for consent with flat mood and affect Lymphatic: No axillary or cervical lymphadenopathy Cardiovascular: No pedal edema Respiratory: No cyanosis, no use of accessory musculature GI: No organomegaly, abdomen is soft and non-tender    Images:  @ENCIMAGES @  Labs:  No results found for: "HGBA1C", "ESRSEDRATE", "CRP", "LABURIC", "REPTSTATUS", "GRAMSTAIN", "CULT", "LABORGA"  No results found for: "ALBUMIN", "PREALBUMIN", "LABURIC"      Latest Ref Rng & Units 09/13/2022    9:59 PM 09/13/2022    9:54 PM 08/09/2016    3:26 PM  CBC EXTENDED  WBC 4.0 - 10.5 K/uL  8.5  7.1   RBC 3.87 - 5.11  MIL/uL  4.64  4.70   Hemoglobin 12.0 - 15.0 g/dL 14.7  82.9  56.2   HCT 36.0 - 46.0 % 41.0  41.3  41.4   Platelets 150 - 400 K/uL  251  229   NEUT# 1.7 - 7.7 K/uL  5.9  4.7   Lymph# 0.7 - 4.0 K/uL  1.8  1.7     Neurologic: Patient does not have protective sensation bilateral lower extremities.   MUSCULOSKELETAL:   Skin: The splint is removed there is ecchymosis and bruising and wrinkling of the skin.  No fracture blisters.  Patient has a strong palpable dorsalis pedis pulse.  Good active movement of her toes.  Review of the radiographs and CT scan shows a trimalleolar ankle fracture with a small avulsion of the posterior malleolus and essentially nondisplaced fibula.  Large displaced medial malleolus.  No widening of the syndesmosis.  Assessment: Trimalleolar left ankle fracture.  Plan: Plan:  Will plan for open reduction internal fixation.  The posterior malleolar fracture is extremely small avulsion fragment without displacement of the syndesmosis.  Will plan for an antiglide plate of the medial malleolus internal fixation of the fibula and most likely will need a syndesmotic screw to stabilize the syndesmosis attached to the small avulsion fragment posteriorly.  Risk and benefits were discussed with the patient and her daughter on the phone, including infection, neurovascular injury, persistent pain, need for additional surgery.  Patient's daughter states she understands and wished to proceed at this time.  Thank you for the consult and the opportunity to see Ms. Jadene Pierini, MD [facility] [phone] 10:16 AM

## 2022-09-14 NOTE — TOC Initial Note (Signed)
Transition of Care [facility]) - Initial/Assessment Note    Patient Details  Name: Donna Jacobs MRN: [id number] Date of Birth: [birth year]  Transition [facility]) CM/SW Contact:    Lorri Frederick, LCSW Phone Number: 09/14/2022, 3:51 PM  Clinical Narrative:    Pt with dementia, oriented x1, CSW spoke with  daughter Donna Jacobs for all information.  Pt lives in mobile home with husband, no current services. Husband now staying home full time as caregiver.  They are requesting SNF with memory care/dementia unit, hopefully in [place].  Referral    sent to all SNFs in nearby area with dementia units.           Expected Discharge Plan: Skilled Nursing Facility Barriers to Discharge: Continued Medical Work up, SNF Pending bed offer   Patient Goals and CMS Choice            Expected Discharge Plan and Services In-house Referral: Clinical Social Work   Post Acute Care Choice: Skilled Nursing Facility Living arrangements for the past 2 months: Single Family Home                                      Prior Living Arrangements/Services Living arrangements for the past 2 months: Single Family Home Lives with:: Spouse Patient language and need for interpreter reviewed:: No        Need for Family Participation in Patient Care: Yes (Comment) Care giver support system in place?: Yes (comment) Current home services: Other (comment) (none) Criminal Activity/Legal Involvement Pertinent to Current Situation/Hospitalization: No - Comment as needed  Activities of Daily Living      Permission Sought/Granted                  Emotional Assessment       Orientation: : Oriented to Self      Admission diagnosis:  Trimalleolar fracture of ankle, closed [S82.853A] Trimalleolar fracture of ankle, closed, left, sequela [S82.852S] Patient Active Problem List   Diagnosis Date Noted   Trimalleolar fracture of ankle, closed, left, sequela 09/13/2022   Trimalleolar fracture of  ankle, closed 09/13/2022   Major neurocognitive disorder, possible Alzheimer's disease 05/28/2019   Menopausal flushing    Menopausal syndrome    Vitamin D deficiency    Hyperlipidemia    Rheumatoid arthritis    History of substance abuse    S/P lumbar spinal fusion 08/15/2016   PCP:  Catha Gosselin, MD Pharmacy:   [facility] [phone] - [place], [place] - [address] AT [facility] [address] [place] [address] Phone: [phone] Fax: [phone]     Social Determinants of Health (SDOH) Social History: SDOH Screenings   Tobacco Use: Medium Risk (09/14/2022)   SDOH Interventions:     Readmission Risk Interventions     No data to display

## 2022-09-14 NOTE — Anesthesia Preprocedure Evaluation (Addendum)
Anesthesia Evaluation  Patient identified by MRN, date of birth, ID band Patient awake    Reviewed: Allergy & Precautions, NPO status , Patient's Chart, lab work & pertinent test results  Airway Mallampati: II  TM Distance: >3 FB Neck ROM: Full    Dental no notable dental hx. (+) Teeth Intact, Dental Advisory Given   Pulmonary former smoker   Pulmonary exam normal breath sounds clear to auscultation       Cardiovascular Normal cardiovascular exam Rhythm:Regular Rate:Normal     Neuro/Psych       Dementia    GI/Hepatic   Endo/Other    Renal/GU      Musculoskeletal  (+) Arthritis , Osteoarthritis,    Abdominal  (+) + obese  Peds  Hematology   Anesthesia Other Findings   Reproductive/Obstetrics                             Anesthesia Physical Anesthesia Plan  ASA: 3  Anesthesia Plan: General   Post-op Pain Management: Minimal or no pain anticipated and Dilaudid IV   Induction: Intravenous  PONV Risk Score and Plan: 3 and Ondansetron, Dexamethasone, Midazolam and Treatment may vary due to age or medical condition  Airway Management Planned: LMA  Additional Equipment:   Intra-op Plan:   Post-operative Plan: Extubation in OR  Informed Consent: I have reviewed the patients History and Physical, chart, labs and discussed the procedure including the risks, benefits and alternatives for the proposed anesthesia with the patient or authorized representative who has indicated his/her understanding and acceptance.     Dental advisory given  Plan Discussed with: Anesthesiologist, Surgeon and CRNA  Anesthesia Plan Comments:         Anesthesia Quick Evaluation

## 2022-09-14 NOTE — Progress Notes (Signed)
Orthopedic Tech Progress Note Patient Details:  GREYSEN GUERINO 02-05-1960 [id number]  Patient ID: Peterson Lombard, female   DOB: 1960-07-14, 62 y.o.   MRN: [id number] Reached out to RN via secure chat in regards to patients shoe size so I can deliver the correct size Cam walker. Awaiting response. Darleen Crocker 09/14/2022, 5:16 PM

## 2022-09-14 NOTE — Plan of Care (Signed)

## 2022-09-14 NOTE — [place] FL2 (Signed)
[place] MEDICAID FL2 LEVEL OF CARE FORM     IDENTIFICATION  Patient Name: Donna Jacobs Birthdate: 1960/03/03 Sex: female Admission Date (Current Location): 09/13/2022  [facility] and [place] Number:  Producer, television/film/video and Address:  [facility], [address], [place], [place] [address]      Provider Number: [id number]  Attending Physician Name and Address:  Loyce Dys, MD  Relative Name and Phone Number:  [facility] Spouse   [phone]    Current Level of Care: Hospital Recommended Level of Care: Skilled Nursing Facility Prior Approval Number:    Date Approved/Denied:   PASRR Number: [id number]  Discharge Plan: SNF    Current Diagnoses: Patient Active Problem List   Diagnosis Date Noted   Trimalleolar fracture of ankle, closed, left, sequela 09/13/2022   Trimalleolar fracture of ankle, closed 09/13/2022   Major neurocognitive disorder, possible Alzheimer's disease 05/28/2019   Menopausal flushing    Menopausal syndrome    Vitamin D deficiency    Hyperlipidemia    Rheumatoid arthritis    History of substance abuse    S/P lumbar spinal fusion 08/15/2016    Orientation RESPIRATION BLADDER Height & Weight     Self  Normal Incontinent Weight: 179 lb 7.3 oz (81.4 kg) Height:     BEHAVIORAL SYMPTOMS/MOOD NEUROLOGICAL BOWEL NUTRITION STATUS      Incontinent Diet (see discharge summary)  AMBULATORY STATUS COMMUNICATION OF NEEDS Skin   Limited Assist Verbally Surgical wounds                       Personal Care Assistance Level of Assistance  Bathing, Feeding, Dressing Bathing Assistance: Limited assistance Feeding assistance: Limited assistance Dressing Assistance: Limited assistance     Functional Limitations Info  Sight, Hearing, Speech Sight Info: Adequate Hearing Info: Adequate Speech Info: Adequate    SPECIAL CARE FACTORS FREQUENCY  PT (By licensed PT), OT (By licensed OT)     PT Frequency: 5x week OT  Frequency: 5x week            Contractures Contractures Info: Not present    Additional Factors Info  Code Status, Allergies Code Status Info: full Allergies Info: Lipitor (Atorvastatin Calcium), Codeine           Current Medications (09/14/2022):  This is the current hospital active medication list Current Facility-Administered Medications  Medication Dose Route Frequency Provider Last Rate Last Admin   bisacodyl (DULCOLAX) EC tablet 5 mg  5 mg Oral Daily PRN Nadara Mustard, MD       ceFAZolin (ANCEF) 2-4 GM/100ML-% IVPB        Stopped at 09/14/22 1458   donepezil (ARICEPT) tablet 10 mg  10 mg Oral QHS Nadara Mustard, MD       ezetimibe (ZETIA) tablet 10 mg  10 mg Oral Daily Nadara Mustard, MD   10 mg at 09/14/22 0949   feeding supplement (ENSURE ENLIVE / ENSURE PLUS) liquid 237 mL  237 mL Oral BID BM Nadara Mustard, MD   237 mL at 09/14/22 1500   fentaNYL (SUBLIMAZE) 100 MCG/2ML injection            HYDROcodone-acetaminophen (NORCO/VICODIN) 5-325 MG per tablet 2 tablet  2 tablet Oral Q4H PRN Nadara Mustard, MD       HYDROmorphone (DILAUDID) 1 MG/ML injection            HYDROmorphone (DILAUDID) injection 0.5 mg  0.5 mg Intravenous Q2H  PRN Nadara Mustard, MD       memantine [facility]) tablet 10 mg  10 mg Oral BID Nadara Mustard, MD   10 mg at 09/14/22 0950   midazolam (VERSED) 2 MG/2ML injection            multivitamin with minerals tablet 1 tablet  1 tablet Oral Daily Nadara Mustard, MD   1 tablet at 09/14/22 0950   senna-docusate (Senokot-S) tablet 1 tablet  1 tablet Oral QHS PRN Nadara Mustard, MD       sodium phosphate (FLEET) enema 1 enema  1 enema Rectal Once PRN Nadara Mustard, MD         Discharge Medications: Please see discharge summary for a list of discharge medications.  Relevant Imaging Results:  Relevant Lab Results:   Additional Information SSN:[id number]  Lorri Frederick, LCSW

## 2022-09-14 NOTE — Transfer of Care (Signed)
Immediate Anesthesia Transfer of Care Note  Patient: Donna Jacobs  Procedure(s) Performed: OPEN REDUCTION INTERNAL FIXATION (ORIF) ANKLE FRACTURE (Left: Ankle)  Patient Location: PACU  Anesthesia Type:General  Level of Consciousness: drowsy  Airway & Oxygen Therapy: Patient Spontanous Breathing  Post-op Assessment: Report given to RN and Post -op Vital signs reviewed and stable  Post vital signs: Reviewed and stable  Last Vitals:  Vitals Value Taken Time  BP 126/100 09/14/22 1330  Temp    Pulse 76 09/14/22 1331  Resp 16 09/14/22 1331  SpO2 97 % 09/14/22 1331  Vitals shown include unfiled device data.  Last Pain:  Vitals:   09/14/22 1127  TempSrc: Oral  PainSc: 6          Complications: No notable events documented.

## 2022-09-14 NOTE — TOC CAGE-AID Note (Signed)
Transition of Care [facility]) - CAGE-AID Screening   Patient Details  Name: Donna Jacobs MRN: [id number] Date of Birth: 08-04-1960  [facility] CM/SW Contact:    Leota Sauers, RN Phone Number: 09/14/2022, 10:42 PM   Clinical Narrative:  Patient endorses some alcohol use, no illicit drug use, resources not given at this time.  CAGE-AID Screening:    Have You Ever Felt You Ought to Cut Down on Your Drinking or Drug Use?: No Have People Annoyed You By Critizing Your Drinking Or Drug Use?: No Have You Felt Bad Or Guilty About Your Drinking Or Drug Use?: No Have You Ever Had a Drink or Used Drugs First Thing In The Morning to Steady Your Nerves or to Get Rid of a Hangover?: No CAGE-AID Score: 0  Substance Abuse Education Offered: No

## 2022-09-14 NOTE — Evaluation (Signed)
Physical Therapy Evaluation Patient Details Name: Donna Jacobs MRN: [id number] DOB: August 30, 1960 Today's Date: 09/14/2022  History of Present Illness  Pt is a 62 year old female admitted on 09/13/22 who fell in her yard a little over a week ago. She noted ankle pain at the time. Was brought to [facility] where she was splinted and instructed to be non-weight bearing. She never made it to the orthopedists office, so she went to see a podiatrist yesterday since she was having trouble managing at home with the weight bearing restrictions. Was brought to [facility] yesterday as a result. Pt with a known history of dementia, hyperlipidemia, rheumatoid arthritis, history of substance abuse.  Clinical Impression  Pt presents with admitting diagnosis above. Pt was a very poor historian and demonstrated zero command following today. Pt was able to sit EOB with CGA however continued to WB through NWB limb despite max verbal and tactile cues. Recommend SNF upon DC. PT will continue to follow.        If plan is discharge home, recommend the following: A lot of help with walking and/or transfers;A lot of help with bathing/dressing/bathroom;Assistance with cooking/housework;Direct supervision/assist for medications management;Help with stairs or ramp for entrance;Assist for transportation;Direct supervision/assist for financial management;Supervision due to cognitive status   Can travel by private vehicle   No    Equipment Recommendations Other (comment) (Per accepting facility)  Recommendations for Other Services       Functional Status Assessment Patient has had a recent decline in their functional status and demonstrates the ability to make significant improvements in function in a reasonable and predictable amount of time.     Precautions / Restrictions Precautions Precautions: Fall Restrictions Weight Bearing Restrictions: Yes LLE Weight Bearing: Non weight bearing      Mobility  Bed  Mobility Overal bed mobility: Needs Assistance Bed Mobility: Supine to Sit, Sit to Supine     Supine to sit: Contact guard Sit to supine: +2 for physical assistance, Max assist   General bed mobility comments: Pt not following commands at all. Required +2 max A for return to supine due to poor command following.    Transfers Overall transfer level: Needs assistance Equipment used: Rolling walker (2 wheels) Transfers: Sit to/from Stand Sit to Stand: Contact guard assist           General transfer comment: Pt does not follow WB precautions at all and required Max cues for safety. Attempted sidesteps however due to pt weighbearing on LLE deferred to lateral scoots which pt also demonstrated diificulty with command following.    Ambulation/Gait               General Gait Details: Pt unable  Stairs            Wheelchair Mobility     Tilt Bed    Modified Rankin (Stroke Patients Only)       Balance Overall balance assessment: History of Falls, Needs assistance Sitting-balance support: Bilateral upper extremity supported, Feet supported Sitting balance-Leahy Scale: Good     Standing balance support: Bilateral upper extremity supported, During functional activity, Reliant on assistive device for balance Standing balance-Leahy Scale: Poor Standing balance comment: Reliant on RW. Poor command following                             Pertinent Vitals/Pain Pain Assessment Pain Assessment: No/denies pain    Home Living Family/patient expects to be discharged to::  Unsure                   Additional Comments: Pt is a very poor historian    Prior Function Prior Level of Function : Patient poor historian/Family not available             Mobility Comments: Pt reports that she has a RW and other DME but doesnt specify what kind of DME. ADLs Comments: Pt reports Ind     Extremity/Trunk Assessment   Upper Extremity Assessment Upper  Extremity Assessment: Overall WFL for tasks assessed    Lower Extremity Assessment Lower Extremity Assessment: LLE deficits/detail LLE Deficits / Details: L ankle fx    Cervical / Trunk Assessment Cervical / Trunk Assessment: Normal  Communication   Communication Communication: Difficulty communicating thoughts/reduced clarity of speech;Difficulty following commands/understanding Following commands: Follows one step commands inconsistently;Follows multi-step commands inconsistently (Does not follow commands at all) Cueing Techniques: Verbal cues;Tactile cues  Cognition Arousal: Alert Behavior During Therapy: Flat affect Overall Cognitive Status: History of cognitive impairments - at baseline                                 General Comments: Per chart pt has dementia. Very poor hisotrian and only A&Ox1.        General Comments General comments (skin integrity, edema, etc.): VSS    Exercises     Assessment/Plan    PT Assessment Patient needs continued PT services  PT Problem List Decreased strength;Decreased range of motion;Decreased activity tolerance;Decreased knowledge of use of DME;Decreased balance;Decreased mobility;Decreased coordination;Decreased cognition;Decreased safety awareness;Decreased knowledge of precautions;Pain       PT Treatment Interventions DME instruction;Gait training;Stair training;Functional mobility training;Therapeutic activities;Therapeutic exercise;Balance training;Neuromuscular re-education;Cognitive remediation;Patient/family education    PT Goals (Current goals can be found in the Care Plan section)  Acute Rehab PT Goals PT Goal Formulation: Patient unable to participate in goal setting Time For Goal Achievement: 09/28/22 Potential to Achieve Goals: Poor    Frequency Min 1X/week     Co-evaluation               AM-PAC PT "6 Clicks" Mobility  Outcome Measure Help needed turning from your back to your side while in a  flat bed without using bedrails?: A Little Help needed moving from lying on your back to sitting on the side of a flat bed without using bedrails?: A Little Help needed moving to and from a bed to a chair (including a wheelchair)?: Total Help needed standing up from a chair using your arms (e.g., wheelchair or bedside chair)?: A Little Help needed to walk in hospital room?: Total Help needed climbing 3-5 steps with a railing? : Total 6 Click Score: 12    End of Session Equipment Utilized During Treatment: Gait belt Activity Tolerance: Other (comment) (Limited due to cognitive deficits) Patient left: in bed;with call bell/phone within reach;with bed alarm set Nurse Communication: Mobility status PT Visit Diagnosis: Other abnormalities of gait and mobility (R26.89)    Time: 0865-7846 PT Time Calculation (min) (ACUTE ONLY): 28 min   Charges:   PT Evaluation $PT Eval High Complexity: 1 High PT Treatments $Therapeutic Activity: 8-22 mins PT General Charges $$ ACUTE PT VISIT: 1 Visit         Shela Nevin, PT, DPT Acute Rehab Services [phone]   [name] 09/14/2022, 9:30 AM

## 2022-09-14 NOTE — Consult Note (Signed)
  Social work will need to speak with patient's daughter, Donna Jacobs, for decisions regarding skilled nursing placement.

## 2022-09-14 NOTE — Progress Notes (Signed)
Initial Nutrition Assessment  DOCUMENTATION CODES:   Obesity unspecified  INTERVENTION:   - Recommend Regular diet once diet advanced  - Ensure Enlive po BID, each supplement provides 350 kcal and 20 grams of protein  - Continue MVI with minerals daily  NUTRITION DIAGNOSIS:   Increased nutrient needs related to hip fracture as evidenced by estimated needs.  GOAL:   Patient will meet greater than or equal to 90% of their needs  MONITOR:   PO intake, Supplement acceptance, Weight trends  REASON FOR ASSESSMENT:   Consult Hip fracture protocol  ASSESSMENT:   62 year old female who presented to the ED on 8/15 after a fall about 1 week PTA. PMH of dementia, HLD, rheumatoid arthritis, substance abuse. Pt admitted with left trimalleolar ankle fracture.  Plan is for ORIF of the left trimalleolar ankle fracture today with Dr. Lajoyce Corners. Pt is NPO for procedure.  Discussed pt with RN who reports pt is confused this morning and is trying to get out of bed. Spoke with pt at bedside. Pt unable to provide much nutrition-related history at this time; suspect this is due to dementia diagnosis.  Pt reports that she is hungry. She states that she usually eats "every day" but is unable to provide more details regarding number of meals per day or what foods she eats. She thinks her weight has been stable. Pt is willing to drink strawberry Ensure. RD to order.  Reviewed weight history in chart. Admission weight appears to be copied over from multiple previous encounters. Unsure of pt's true weight at this time.  Recommend Regular diet once diet advanced.  Medications reviewed and include: MVI with minerals  Labs reviewed.  NUTRITION - FOCUSED PHYSICAL EXAM:  Flowsheet Row Most Recent Value  Orbital Region No depletion  Upper Arm Region No depletion  Thoracic and Lumbar Region No depletion  Buccal Region No depletion  Temple Region No depletion  Clavicle Bone Region No depletion   Clavicle and Acromion Bone Region No depletion  Scapular Bone Region No depletion  Dorsal Hand No depletion  Patellar Region No depletion  Anterior Thigh Region No depletion  Posterior Calf Region No depletion  Edema (RD Assessment) None  Hair Reviewed  Eyes Reviewed  Mouth Reviewed  Skin Reviewed  Nails Reviewed    Diet Order:   Diet Order             Diet NPO time specified  Diet effective now                   EDUCATION NEEDS:   Not appropriate for education at this time  Skin:  Skin Assessment: Reviewed RN Assessment  Last BM:  no documented BM  Height:   Ht Readings from Last 1 Encounters:  09/05/22 5' (1.524 m)    Weight:   Wt Readings from Last 1 Encounters:  09/14/22 81.4 kg    Ideal Body Weight:  45.5 kg  BMI:  Body mass index is 35.05 kg/m.  Estimated Nutritional Needs:   Kcal:  1800-2000  Protein:  85-100 grams  Fluid:  1.8-2.0 L    Mertie Clause, MS, RD, LDN Registered Dietitian II Please see AMiON for contact information.

## 2022-09-14 NOTE — Op Note (Signed)
09/13/2022 - 09/14/2022  1:12 PM  PATIENT:  Donna Jacobs    PRE-OPERATIVE DIAGNOSIS:  Left ankle trimalleolar fracture  POST-OPERATIVE DIAGNOSIS:  Same  PROCEDURE:  OPEN REDUCTION INTERNAL FIXATION (ORIF) medial and lateral malleolus left ankle. C-arm fluoroscopy to verify reduction.  SURGEON:  Nadara Mustard, MD  PHYSICIAN ASSISTANT:None ANESTHESIA:   General  PREOPERATIVE INDICATIONS:  [name] is a  62 y.o. female with a diagnosis of Left ankle fracture who failed conservative measures and elected for surgical management.    The risks benefits and alternatives were discussed with the patient preoperatively including but not limited to the risks of infection, bleeding, nerve injury, cardiopulmonary complications, the need for revision surgery, among others, and the patient was willing to proceed.  OPERATIVE IMPLANTS:   Implant Name Type Inv. Item Serial No. Manufacturer Lot No. LRB No. Used Action  PLATE 7HOLE 1/3 TUBULAR - [id number] Plate PLATE 7HOLE 1/3 TUBULAR  [employer](ORTH,TRAU,BIO,SG)  Left 1 Implanted  SCREW CORTICAL 3.5X14 - [id number] Screw SCREW CORTICAL 3.5X14  [employer](ORTH,TRAU,BIO,SG)  Left 1 Implanted  SCREW CORTICAL 3.5X12 - [id number] Screw SCREW CORTICAL 3.5X12  [employer](ORTH,TRAU,BIO,SG)  Left 2 Implanted  SCREW CORTICAL 3.5 - [id number] Screw SCREW CORTICAL 3.5  [employer]IM[employer]ON(ORTH,TRAU,BIO,SG)  Left 2 Implanted  SCREW CORTICAL 3.5X30MM - [id number] Screw SCREW CORTICAL 3.5X30MM  [employer]IMMER [employer]ON(ORTH,TRAU,BIO,SG)  Left 1 Implanted  SCREW CORT ST 3.5X40MM - [id number] Screw SCREW CORT ST 3.5X40MM  ZIM[employer]ON(ORTH,TRAU,BIO,SG)  Left 1 Implanted  PLATE SMALL FRAG 3.5X49 4H - [id number] Plate PLATE SMALL FRAG 3.5X49 4H  ZIM[employer]ON(ORTH,TRAU,BIO,SG)  Left 1 Implanted    @ENCIMAGES @  OPERATIVE FINDINGS: C-arm possibly verified congruent mortise postoperatively.  Syndesmosis did not widen.  OPERATIVE PROCEDURE: Patient is brought the  operating room and underwent a general anesthetic.  After adequate levels anesthesia were obtained patient's left lower extremity was prepped using DuraPrep draped into a sterile field a timeout was called.  A lateral incision was made over the fibula this was carried sharply down to bone.  The fracture was transverse and a neutralization plate was applied laterally after reduction.  This was secured proximally with 2 compression screws and distally with 2 compression screws.  There is no instability of the syndesmosis.  A medial incision was made this was carried down to bone and the fracture edges were freshened and a plate was applied medially.  2 compression screws were applied and this reduced the medial malleolar fracture.  C-arm Shrosbree verified reduction.  The medial and lateral wounds were irrigated with normal saline.  Incisions were closed using 2-0 nylon a sterile dressing was applied patient was extubated taken the PACU in stable condition.   DISCHARGE PLANNING:  Antibiotic duration: 24 hours  Weightbearing: Weightbearing as needed for transfers.  No gait training at this time secondary to dementia.  Pain medication: Low-dose opioid pathway.  Dressing care/ Wound VAC: Dry dressing reinforce as needed.  Ambulatory devices: Transfer with assistance bed to chair.  Discharge to: Anticipate discharge to skilled nursing.  Follow-up: In the office 1 week post operative.

## 2022-09-14 NOTE — Progress Notes (Signed)
Progress Note   Patient: Donna Jacobs UEA:[id number] DOB: [birth year] DOA: 09/13/2022     1 DOS: the patient was seen and examined on 09/14/2022     Subjective:  Patient seen and examined at bedside this morning Underwent surgery involving Admits to some pain at the surgical site however improved with pain medication Denies nausea vomiting abdominal pain or chest pain  Brief hospital course: From HPI "Donna Jacobs is a 62 y.o. female with a known history of dementia, hyperlipidemia, rheumatoid arthritis, history of substance abuse presents to the emergency department for evaluation of ankle pain status post fall.  Patient was in a usual state of health until 8 days ago when she sustained a fall in her yard, was seen in the [facility] emergency department and found to have a bimalleolar fracture of the left ankle and was splinted.  She was instructed to follow-up with orthopedics but ended up going to podiatry today because of difficulty with weightbearing.  She was sent to the emergency department by her podiatrist for evaluation of the ankle pain.  "  Assessment and Plan: Acute trimalleolar fracture of the left ankle Patient with acute trimalleolar fracture of the left ankle s/p surgery Patient was taken to the OR today for operative intervention Continue as needed pain medication Continue PT OT Transition of care manager on board we appreciate input Plan of care discussed with orthopedic surgeon trimalleolar fracture of the left ankle    #.  History of dementia - Continue donepezil and memantine   #.  History of hyperlipidemia - Continue ezetimibe    Diet/Nutrition: Heart healthy  Consults called: Ortho, PT, social work  DVT Px: SCDs and early ambulation.  Code Status: Full Code   Disposition Plan: To be determined          Physical Exam: the bed in no acute distress.  Does not look confused HEENT: Head atraumatic, normocephalic. Pupils equal. Mucus membranes  moist. NECK: Supple. No JVD. CHEST: Normal breath sounds bilaterally. No wheezing, rales, rhonchi or crackles. No use of accessory muscles of respiration.  No reproducible chest wall tenderness.  CARDIOVASCULAR: S1, S2 normal. No murmurs, rubs, or gallops. Cap refill <2 seconds. Pulses intact distally.  ABDOMEN: Soft, nondistended, nontender. No rebound, guarding, rigidity. Normoactive bowel sounds present in all four quadrants.  EXTREMITIES: Patient noted to the left ankle NEUROLOGIC: Nonfocal awake and alert PSYCHIATRIC:  Normal affect, mood, thought content. SKIN: Warm, dry, and intact without obvious rash, lesion, or ulcer.  Vitals:   09/14/22 1146 09/14/22 1325 09/14/22 1340 09/14/22 1355  BP: (!) 159/76 (!) 149/80 128/66 121/70  Pulse:  76 (!) 53 60  Resp:  13 11 14   Temp:  97.7 F (36.5 C)    TempSrc:      SpO2:  98% 96% 95%  Weight:        Data Reviewed: Personally reviewed the patient's CT scan of the left ankle showing findings of fracture involving the left ankle. I have also reviewed CBC as well as CMP with results as shown below    Latest Ref Rng & Units 09/13/2022    9:59 PM 09/13/2022    9:54 PM 08/09/2016    3:26 PM  CBC  WBC 4.0 - 10.5 K/uL  8.5  7.1   Hemoglobin 12.0 - 15.0 g/dL 47.8  29.5  62.1   Hematocrit 36.0 - 46.0 % 41.0  41.3  41.4   Platelets 150 - 400 K/uL  251  229  Latest Ref Rng & Units 09/13/2022    9:59 PM 09/13/2022    9:54 PM 08/09/2016    3:26 PM  CMP  Glucose 70 - 99 mg/dL 664  403  93   BUN 6 - 20 mg/dL 17  17  10    Creatinine 0.44 - 1.00 mg/dL 4.74  2.59  5.63   Sodium 135 - 145 mmol/L 139  137  140   Potassium 3.5 - 5.1 mmol/L 3.7  3.7  3.7   Chloride 98 - 111 mmol/L 104  103  106   CO2 22 - 32 mmol/L  24  25   Calcium 8.9 - 10.3 mg/dL  8.8  9.0     Family Communication: Present at bedside at this time   Author: Loyce Dys, MD 09/14/2022 5:23 PM  For on call review [email or web address].

## 2022-09-14 NOTE — Anesthesia Procedure Notes (Signed)
Procedure Name: LMA Insertion Date/Time: 09/14/2022 12:23 PM  Performed by: Gwenyth Allegra, CRNAPre-anesthesia Checklist: Patient identified, Emergency Drugs available, Suction available and Patient being monitored Patient Re-evaluated:Patient Re-evaluated prior to induction Oxygen Delivery Method: Circle system utilized Preoxygenation: Pre-oxygenation with 100% oxygen Induction Type: IV induction LMA: LMA inserted LMA Size: 4.0 Number of attempts: 1 Placement Confirmation: positive ETCO2 and breath sounds checked- equal and bilateral Tube secured with: Tape Dental Injury: Teeth and Oropharynx as per pre-operative assessment

## 2022-09-15 DIAGNOSIS — S82852S Displaced trimalleolar fracture of left lower leg, sequela: Secondary | ICD-10-CM | POA: Diagnosis not present

## 2022-09-15 LAB — BASIC METABOLIC PANEL
Anion gap: 11 (ref 5–15)
BUN: 12 mg/dL (ref 6–20)
CO2: 24 mmol/L (ref 22–32)
Calcium: 8.6 mg/dL — ABNORMAL LOW (ref 8.9–10.3)
Chloride: 100 mmol/L (ref 98–111)
Creatinine, Ser: 0.59 mg/dL (ref 0.44–1.00)
GFR, Estimated: >60 mL/min (ref >60)
Glucose, Bld: 107 mg/dL — ABNORMAL HIGH (ref 70–99)
Potassium: 3.7 mmol/L (ref 3.5–5.1)
Sodium: 135 mmol/L (ref 135–145)

## 2022-09-15 LAB — CBC
HCT: 39.6 % (ref 36.0–46.0)
Hemoglobin: 13.2 g/dL (ref 12.0–15.0)
MCH: 29.2 pg (ref 26.0–34.0)
MCHC: 33.3 g/dL (ref 30.0–36.0)
MCV: 87.6 fL (ref 80.0–100.0)
Platelets: 237 10*3/uL (ref 150–400)
RBC: 4.52 MIL/uL (ref 3.87–5.11)
RDW: 12.7 % (ref 11.5–15.5)
WBC: 10 10*3/uL (ref 4.0–10.5)
nRBC: 0 % (ref 0.0–0.2)

## 2022-09-15 MED ORDER — JUVEN PO PACK
1.0000 | PACK | Freq: Two times a day (BID) | ORAL | Status: DC
  Administered 2022-09-15 – 2022-09-20 (×11): 1 via ORAL
  Filled 2022-09-15 (×11): qty 1

## 2022-09-15 MED ORDER — SODIUM CHLORIDE 0.9 % IV SOLN: INTRAVENOUS | Status: AC

## 2022-09-15 NOTE — Evaluation (Signed)
Occupational Therapy Evaluation Patient Details Name: Donna Jacobs MRN: [id number] DOB: Feb 21, 1960 Today's Date: 09/15/2022   History of Present Illness Pt is a 62 year old female admitted on 09/13/22 who fell in her yard a little over a week prior to admission sustaining L trimalleolar fx. She was sent home from [facility] non weightbearing, family unable to manage her. Underwent ORIF 8/16. Pt with a known history of dementia, hyperlipidemia, rheumatoid arthritis, history of substance abuse.   Clinical Impression   Pt lives with her supportive husband at home and typically ambulates,sometimes with a cane, and completes basic ADLs with assist as needed. She is dependent in IADLs. Pt has early onset dementia and has had a fast decline per her family. Pt presents with baseline cognitive impairment, generalized weakness and poor standing balance. She demonstrated ability to transfer toward her R side x 2 with +2 mod assist. Pt needs set up to total assist for ADLs. Patient will benefit from continued inpatient follow up therapy, <3 hours/day       If plan is discharge home, recommend the following: Two people to help with walking and/or transfers;Two people to help with bathing/dressing/bathroom;Assistance with cooking/housework;Assistance with feeding;Direct supervision/assist for medications management;Direct supervision/assist for financial management;Assist for transportation;Help with stairs or ramp for entrance    Functional Status Assessment  Patient has had a recent decline in their functional status and demonstrates the ability to make significant improvements in function in a reasonable and predictable amount of time.  Equipment Recommendations  Other (comment) (defer to next venue)    Recommendations for Other Services       Precautions / Restrictions Precautions Precautions: Fall Required Braces or Orthoses: Other Brace Other Brace: L CAM boot Restrictions Weight Bearing  Restrictions: Yes LLE Weight Bearing: Weight bearing as tolerated Other Position/Activity Restrictions: transfers only, no gait training      Mobility Bed Mobility Overal bed mobility: Needs Assistance Bed Mobility: Supine to Sit     Supine to sit: Min assist     General bed mobility comments: min assist for L LE with CAM boot donned    Transfers Overall transfer level: Needs assistance Equipment used: 2 person hand held assist Transfers: Sit to/from Stand, Bed to chair/wheelchair/BSC Sit to Stand: +2 physical assistance, Mod assist Stand pivot transfers: +2 physical assistance, Mod assist         General transfer comment: difficulty stepping with L LE, assist to rise and steady      Balance Overall balance assessment: History of Falls, Needs assistance Sitting-balance support: Feet supported Sitting balance-Leahy Scale: Fair     Standing balance support: Bilateral upper extremity supported, During functional activity, Reliant on assistive device for balance Standing balance-Leahy Scale: Poor                             ADL either performed or assessed with clinical judgement   ADL Overall ADL's : Needs assistance/impaired Eating/Feeding: Set up;Sitting   Grooming: Brushing hair;Total assistance;Sitting   Upper Body Bathing: Maximal assistance;Sitting   Lower Body Bathing: Total assistance;Sit to/from stand;+2 for safety/equipment   Upper Body Dressing : Sitting;Maximal assistance   Lower Body Dressing: Total assistance;+2 for safety/equipment;Sit to/from stand   Toilet Transfer: +2 for physical assistance;Moderate assistance;Stand-pivot;BSC/3in1   Toileting- Clothing Manipulation and Hygiene: Total assistance;Sit to/from stand               Vision Baseline Vision/History: 0 No visual deficits Ability to  See in Adequate Light: 0 Adequate Patient Visual Report: No change from baseline       Perception         Praxis          Pertinent Vitals/Pain Pain Assessment Pain Assessment: Faces Faces Pain Scale: Hurts a little bit Pain Location: L LE Pain Descriptors / Indicators: Sore Pain Intervention(s): Monitored during session, Repositioned, Premedicated before session     Extremity/Trunk Assessment Upper Extremity Assessment Upper Extremity Assessment: Overall WFL for tasks assessed   Lower Extremity Assessment Lower Extremity Assessment: Defer to PT evaluation   Cervical / Trunk Assessment Cervical / Trunk Assessment: Normal   Communication Communication Communication: Difficulty following commands/understanding Following commands: Follows one step commands inconsistently;Follows one step commands with increased time (and multimodal cues)   Cognition Arousal: Alert Behavior During Therapy: Flat affect Overall Cognitive Status: History of cognitive impairments - at baseline                                 General Comments: needs step by step, multimodal cues to follow commands     General Comments       Exercises     Shoulder Instructions      Home Living Family/patient expects to be discharged to:: Skilled nursing facility                                 Additional Comments: family prefers SNF near home      Prior Functioning/Environment Prior Level of Function : Needs assist             Mobility Comments: ambulated with cane ADLs Comments: Pt reports Ind, assist for IADLs        OT Problem List: Decreased strength;Decreased activity tolerance;Impaired balance (sitting and/or standing);Decreased cognition;Decreased safety awareness;Decreased knowledge of use of DME or AE;Pain      OT Treatment/Interventions: Self-care/ADL training;DME and/or AE instruction;Therapeutic activities;Patient/family education;Balance training    OT Goals(Current goals can be found in the care plan section) Acute Rehab OT Goals OT Goal Formulation: With family Time  For Goal Achievement: 09/29/22 Potential to Achieve Goals: Fair  OT Frequency: Min 1X/week    Co-evaluation PT/OT/SLP Co-Evaluation/Treatment: Yes Reason for Co-Treatment: For patient/therapist safety   OT goals addressed during session: ADL's and self-care      AM-PAC OT "6 Clicks" Daily Activity     Outcome Measure Help from another person eating meals?: A Little Help from another person taking care of personal grooming?: Total Help from another person toileting, which includes using toliet, bedpan, or urinal?: Total Help from another person bathing (including washing, rinsing, drying)?: A Lot Help from another person to put on and taking off regular upper body clothing?: A Lot Help from another person to put on and taking off regular lower body clothing?: Total 6 Click Score: 10   End of Session Nurse Communication: Mobility status  Activity Tolerance: Patient tolerated treatment well Patient left: in chair;with call bell/phone within reach;with chair alarm set;with family/visitor present  OT Visit Diagnosis: Unsteadiness on feet (R26.81);Muscle weakness (generalized) (M62.81);History of falling (Z91.81);Other symptoms and signs involving cognitive function                Time: 2952-8413 OT Time Calculation (min): 45 min Charges:  OT General Charges $OT Visit: 1 Visit OT Evaluation $OT Eval Moderate Complexity: 1 Mod  Raynelle Fanning  M, OTR/L Acute Rehabilitation Services Office: [phone]  Evern Bio 09/15/2022, 2:06 PM

## 2022-09-15 NOTE — Progress Notes (Signed)
Progress Note   Patient: Donna Jacobs QIO:[id number] DOB: May 20, 1960 DOA: 09/13/2022     2 DOS: the patient was seen and examined on 09/15/2022     Subjective:  Patient seen and examined at bedside this morning in the presence of the family They are wishes to have patient placed in a rehab before going home Patient denies nausea vomiting abdominal pain or chest pain Ankle pain is better  Brief hospital course: From HPI "Donna Jacobs is a 62 y.o. female with a known history of dementia, hyperlipidemia, rheumatoid arthritis, history of substance abuse presents to the emergency department for evaluation of ankle pain status post fall.  Patient was in a usual state of health until 8 days ago when she sustained a fall in her yard, was seen in the [facility] emergency department and found to have a bimalleolar fracture of the left ankle and was splinted.  She was instructed to follow-up with orthopedics but ended up going to podiatry today because of difficulty with weightbearing.  She was sent to the emergency department by her podiatrist for evaluation of the ankle pain.  "   Assessment and Plan: Acute trimalleolar fracture of the left ankle Patient with acute trimalleolar fracture of the left ankle s/p surgery Patient underwent surgical repair on 09/14/2022 Continue PT OT Since patient is not eating well we have placed on supplemental IV fluid PT OT has recommended skilled nursing facility/acute rehab Transition of care manager working on this Plan of care discussed with orthopedic surgeon Continue as needed pain medication     #.  History of dementia - Continue donepezil and memantine   #.  History of hyperlipidemia - Continue ezetimibe     Diet/Nutrition: Heart healthy   Consults called: Ortho, PT, social work   DVT Px: SCDs and early ambulation.   Code Status: Full Code    Disposition Plan: To be determined       Physical Exam: the bed in no acute distress.  Does not look  confused HEENT: Head atraumatic, normocephalic. Pupils equal. Mucus membranes moist. NECK: Supple. No JVD. CHEST: Normal breath sounds bilaterally. No wheezing, rales, rhonchi or crackles. No use of accessory muscles of respiration.  No reproducible chest wall tenderness.  CARDIOVASCULAR: S1, S2 normal. No murmurs, rubs, or gallops. Cap refill <2 seconds. Pulses intact distally.  ABDOMEN: Soft, nondistended, nontender. No rebound, guarding, rigidity. Normoactive bowel sounds present in all four quadrants.  EXTREMITIES: Patient with the left ankle in the booth NEUROLOGIC: Nonfocal awake and alert PSYCHIATRIC:  Normal affect, mood, thought content. SKIN: Warm, dry, and intact without obvious rash, lesion, or ulcer.    Data Reviewed: I have reviewed patient's CBC as well as BMP as documented below I have also reviewed PT OT recommendation of SNF/acute rehab and I have reviewed orthopedic surgeon documentation     Latest Ref Rng & Units 09/15/2022    1:27 AM 09/13/2022    9:59 PM 09/13/2022    9:54 PM  CBC  WBC 4.0 - 10.5 K/uL 10.0   8.5   Hemoglobin 12.0 - 15.0 g/dL 32.4  40.1  02.7   Hematocrit 36.0 - 46.0 % 39.6  41.0  41.3   Platelets 150 - 400 K/uL 237   251        Latest Ref Rng & Units 09/15/2022    1:27 AM 09/13/2022    9:59 PM 09/13/2022    9:54 PM  BMP  Glucose 70 - 99 mg/dL 253  664  144   BUN 6 - 20 mg/dL 12  17  17    Creatinine 0.44 - 1.00 mg/dL 1.61  0.96  0.45   Sodium 135 - 145 mmol/L 135  139  137   Potassium 3.5 - 5.1 mmol/L 3.7  3.7  3.7   Chloride 98 - 111 mmol/L 100  104  103   CO2 22 - 32 mmol/L 24   24   Calcium 8.9 - 10.3 mg/dL 8.6   8.8     Vitals:   09/14/22 1923 09/15/22 0002 09/15/22 0403 09/15/22 0730  BP: (!) 143/75 133/77 133/80 133/80  Pulse: 85 93 72 70  Resp: 18 18 18 17   Temp: 98.6 F (37 C) 98.2 F (36.8 C) 97.7 F (36.5 C) 98.3 F (36.8 C)  TempSrc: Oral Oral Oral Oral  SpO2: 98% 98% 97% 99%  Weight:         Author: Loyce Dys,  MD 09/15/2022 4:38 PM  For on call review [email or web address].

## 2022-09-15 NOTE — Progress Notes (Signed)
Physical Therapy Treatment Patient Details Name: Donna Jacobs MRN: [id number] DOB: 03-06-1960 Today's Date: 09/15/2022   History of Present Illness Pt is a 62 year old female admitted on 09/13/22 who fell in her yard a little over a week prior to admission sustaining L trimalleolar fx. She was sent home from [facility] non weightbearing, family unable to manage her. Underwent ORIF 8/16. Pt with a known history of dementia, hyperlipidemia, rheumatoid arthritis, history of substance abuse.    PT Comments  Pt tolerated treatment well today. Co-treat with OT. Pt today with new orders for WBAT for transfer only after surgery yesterday. Pt today required +2 Mod A to [facility] and chair via 2 person HHA. No change in DC/DME recs at this time. PT will continue to follow.    If plan is discharge home, recommend the following: A lot of help with walking and/or transfers;A lot of help with bathing/dressing/bathroom;Assistance with cooking/housework;Direct supervision/assist for medications management;Help with stairs or ramp for entrance;Assist for transportation;Direct supervision/assist for financial management;Supervision due to cognitive status   Can travel by private vehicle     No  Equipment Recommendations  Other (comment) (Per accepting facility)    Recommendations for Other Services       Precautions / Restrictions Precautions Precautions: Fall Required Braces or Orthoses: Other Brace Other Brace: L CAM boot Restrictions Weight Bearing Restrictions: Yes LLE Weight Bearing: Weight bearing as tolerated Other Position/Activity Restrictions: transfers only, no gait training     Mobility  Bed Mobility Overal bed mobility: Needs Assistance Bed Mobility: Supine to Sit     Supine to sit: Min assist     General bed mobility comments: min assist for L LE with CAM boot donned    Transfers Overall transfer level: Needs assistance Equipment used: 2 person hand held assist Transfers: Sit  to/from Stand, Bed to chair/wheelchair/BSC Sit to Stand: +2 physical assistance, Mod assist Stand pivot transfers: +2 physical assistance, Mod assist         General transfer comment: difficulty stepping with L LE, assist to rise and steady    Ambulation/Gait               General Gait Details: Transfer only per surgeon   Stairs             Wheelchair Mobility     Tilt Bed    Modified Rankin (Stroke Patients Only)       Balance Overall balance assessment: History of Falls, Needs assistance Sitting-balance support: Feet supported Sitting balance-Leahy Scale: Fair     Standing balance support: Bilateral upper extremity supported, During functional activity, Reliant on assistive device for balance Standing balance-Leahy Scale: Poor                              Cognition Arousal: Alert Behavior During Therapy: Flat affect Overall Cognitive Status: History of cognitive impairments - at baseline                                 General Comments: needs step by step, multimodal cues to follow commands        Exercises      General Comments General comments (skin integrity, edema, etc.): VSS      Pertinent Vitals/Pain Pain Assessment Pain Assessment: Faces Faces Pain Scale: Hurts a little bit Pain Location: L LE Pain Descriptors / Indicators: Sore Pain  Intervention(s): Monitored during session, Limited activity within patient's tolerance, Premedicated before session, Repositioned    Home Living Family/patient expects to be discharged to:: Skilled nursing facility                   Additional Comments: family prefers SNF near home    Prior Function            PT Goals (current goals can now be found in the care plan section) Progress towards PT goals: Progressing toward goals    Frequency    Min 1X/week      PT Plan      Co-evaluation PT/OT/SLP Co-Evaluation/Treatment: Yes Reason for  Co-Treatment: For patient/therapist safety;Necessary to address cognition/behavior during functional activity PT goals addressed during session: Mobility/safety with mobility;Proper use of DME OT goals addressed during session: ADL's and self-care      AM-PAC PT "6 Clicks" Mobility   Outcome Measure  Help needed turning from your back to your side while in a flat bed without using bedrails?: A Little Help needed moving from lying on your back to sitting on the side of a flat bed without using bedrails?: A Little Help needed moving to and from a bed to a chair (including a wheelchair)?: A Lot Help needed standing up from a chair using your arms (e.g., wheelchair or bedside chair)?: A Lot Help needed to walk in hospital room?: Total Help needed climbing 3-5 steps with a railing? : Total 6 Click Score: 12    End of Session Equipment Utilized During Treatment: Gait belt Activity Tolerance: Patient tolerated treatment well;Patient limited by pain;Patient limited by fatigue Patient left: in chair;with call bell/phone within reach;with chair alarm set;with family/visitor present Nurse Communication: Mobility status PT Visit Diagnosis: Other abnormalities of gait and mobility (R26.89)     Time: 8657-8469 PT Time Calculation (min) (ACUTE ONLY): 28 min  Charges:    $Therapeutic Activity: 8-22 mins PT General Charges $$ ACUTE PT VISIT: 1 Visit                     Shela Nevin, PT, DPT Acute Rehab Services [phone]    [name] 09/15/2022, 4:29 PM

## 2022-09-15 NOTE — Plan of Care (Signed)

## 2022-09-15 NOTE — Plan of Care (Signed)

## 2022-09-15 NOTE — Progress Notes (Signed)
Patient's daughter in law keeps requesting iv pain medication for patient.  Patient has been getting the Norco and it appears to  be helping her.  I explained to the daughter in law that the patient has iv pain medication if the pills do not work.  Also the daughter in law is requesting a sitter for the patient because of mentation at baseline.  Patients who qualify for a sitter are patients who are doing things that maybe harmful or interfere with care, like pulling out equipment.  I told her I would leave a note for the rounding doctors.

## 2022-09-15 NOTE — Progress Notes (Signed)
Physical Therapy Treatment Patient Details Name: Donna Jacobs MRN: [id number] DOB: May 10, 1960 Today's Date: 09/15/2022   History of Present Illness Pt is a 62 year old female admitted on 09/13/22 who fell in her yard a little over a week prior to admission sustaining L trimalleolar fx. She was sent home from [facility] non weightbearing, family unable to manage her. Underwent ORIF 8/16. Pt with a known history of dementia, hyperlipidemia, rheumatoid arthritis, history of substance abuse.    PT Comments  Pt received for second session to assist pt back to bed with nursing staff. No change in mobility from first session. No change in DC/DME recs at this time. PT will continue to follow.    If plan is discharge home, recommend the following: A lot of help with walking and/or transfers;A lot of help with bathing/dressing/bathroom;Assistance with cooking/housework;Direct supervision/assist for medications management;Help with stairs or ramp for entrance;Assist for transportation;Direct supervision/assist for financial management;Supervision due to cognitive status   Can travel by private vehicle     No  Equipment Recommendations  Other (comment) (Per accepting facility)    Recommendations for Other Services       Precautions / Restrictions Precautions Precautions: Fall Required Braces or Orthoses: Other Brace Other Brace: L CAM boot Restrictions Weight Bearing Restrictions: Yes LLE Weight Bearing: Weight bearing as tolerated Other Position/Activity Restrictions: transfers only, no gait training     Mobility  Bed Mobility Overal bed mobility: Needs Assistance Bed Mobility: Supine to Sit     Supine to sit: Min assist Sit to supine: +2 for physical assistance, Max assist   General bed mobility comments: +2 Max A via helicopter method due to pt unable to follow commands.    Transfers Overall transfer level: Needs assistance Equipment used: 2 person hand held assist Transfers: Sit  to/from Stand, Bed to chair/wheelchair/BSC Sit to Stand: +2 physical assistance, Mod assist Stand pivot transfers: +2 physical assistance, Mod assist         General transfer comment: difficulty stepping with L LE, assist to rise and steady    Ambulation/Gait               General Gait Details: Transfer only per surgeon   Stairs             Wheelchair Mobility     Tilt Bed    Modified Rankin (Stroke Patients Only)       Balance Overall balance assessment: History of Falls, Needs assistance Sitting-balance support: Feet supported Sitting balance-Leahy Scale: Fair     Standing balance support: Bilateral upper extremity supported, During functional activity, Reliant on assistive device for balance Standing balance-Leahy Scale: Poor                              Cognition Arousal: Alert Behavior During Therapy: Flat affect Overall Cognitive Status: History of cognitive impairments - at baseline                                 General Comments: needs step by step, multimodal cues to follow commands        Exercises      General Comments General comments (skin integrity, edema, etc.): VSS      Pertinent Vitals/Pain Pain Assessment Pain Assessment: Faces Faces Pain Scale: Hurts a little bit Pain Location: L LE Pain Descriptors / Indicators: Sore Pain Intervention(s): Monitored during  session, Limited activity within patient's tolerance, Premedicated before session, Repositioned    Home Living Family/patient expects to be discharged to:: Skilled nursing facility                   Additional Comments: family prefers SNF near home    Prior Function            PT Goals (current goals can now be found in the care plan section) Progress towards PT goals: Progressing toward goals    Frequency    Min 1X/week      PT Plan      Co-evaluation PT/OT/SLP Co-Evaluation/Treatment: Yes Reason for  Co-Treatment: For patient/therapist safety;Necessary to address cognition/behavior during functional activity PT goals addressed during session: Mobility/safety with mobility;Proper use of DME OT goals addressed during session: ADL's and self-care      AM-PAC PT "6 Clicks" Mobility   Outcome Measure  Help needed turning from your back to your side while in a flat bed without using bedrails?: A Little Help needed moving from lying on your back to sitting on the side of a flat bed without using bedrails?: A Little Help needed moving to and from a bed to a chair (including a wheelchair)?: A Lot Help needed standing up from a chair using your arms (e.g., wheelchair or bedside chair)?: A Lot Help needed to walk in hospital room?: Total Help needed climbing 3-5 steps with a railing? : Total 6 Click Score: 12    End of Session Equipment Utilized During Treatment: Gait belt Activity Tolerance: Patient tolerated treatment well;Patient limited by pain;Patient limited by fatigue Patient left: in chair;with call bell/phone within reach;with chair alarm set;with family/visitor present Nurse Communication: Mobility status PT Visit Diagnosis: Other abnormalities of gait and mobility (R26.89)     Time: 8413-2440 PT Time Calculation (min) (ACUTE ONLY): 12 min  Charges:    $Therapeutic Activity: 8-22 mins PT General Charges $$ ACUTE PT VISIT: 1 Visit                     Lonell Face, DPT Acute Rehab Services [phone]    [name] 09/15/2022, 4:33 PM

## 2022-09-15 NOTE — Progress Notes (Signed)
Orthopedic Tech Progress Note Patient Details:  Donna Jacobs 1960-07-14 [id number]  Ortho Devices Type of Ortho Device: CAM walker Ortho Device/Splint Location: LLE Ortho Device/Splint Interventions: Ordered      Al Decant 09/15/2022, 6:33 AM

## 2022-09-15 NOTE — Progress Notes (Addendum)
Patient ID: Donna Jacobs, female   DOB: [birth year], 62 y.o.   MRN: [id number] Patient is postoperative day 1 open reduction internal fixation left ankle fracture.  Patient may be weightbearing as needed for transfer training.  The fracture boot should be in place for weightbearing.  Patient may leave the boot off while in bed with the foot elevated.  Patient currently is not eating or drinking.  She will need assistance for eating and drinking.  Patient will need discharge to skilled nursing.  Patient's daughter is available for discharge planning.  I have placed order to increase her IV fluids and add Juven for protein supplement.  Ideally patient's pain should be controlled with Tylenol.

## 2022-09-16 DIAGNOSIS — S82852S Displaced trimalleolar fracture of left lower leg, sequela: Secondary | ICD-10-CM | POA: Diagnosis not present

## 2022-09-16 LAB — CBC
HCT: 33.3 % — ABNORMAL LOW (ref 36.0–46.0)
Hemoglobin: 11.1 g/dL — ABNORMAL LOW (ref 12.0–15.0)
MCH: 30.1 pg (ref 26.0–34.0)
MCHC: 33.3 g/dL (ref 30.0–36.0)
MCV: 90.2 fL (ref 80.0–100.0)
Platelets: 203 10*3/uL (ref 150–400)
RBC: 3.69 MIL/uL — ABNORMAL LOW (ref 3.87–5.11)
RDW: 12.9 % (ref 11.5–15.5)
WBC: 8.3 10*3/uL (ref 4.0–10.5)
nRBC: 0 % (ref 0.0–0.2)

## 2022-09-16 LAB — BASIC METABOLIC PANEL
Anion gap: 6 (ref 5–15)
BUN: 15 mg/dL (ref 6–20)
CO2: 26 mmol/L (ref 22–32)
Calcium: 8 mg/dL — ABNORMAL LOW (ref 8.9–10.3)
Chloride: 106 mmol/L (ref 98–111)
Creatinine, Ser: 0.67 mg/dL (ref 0.44–1.00)
GFR, Estimated: >60 mL/min (ref >60)
Glucose, Bld: 107 mg/dL — ABNORMAL HIGH (ref 70–99)
Potassium: 3.6 mmol/L (ref 3.5–5.1)
Sodium: 138 mmol/L (ref 135–145)

## 2022-09-16 MED ORDER — ENOXAPARIN SODIUM 40 MG/0.4ML IJ SOSY
40.0000 mg | PREFILLED_SYRINGE | INTRAMUSCULAR | Status: DC
  Administered 2022-09-16 – 2022-09-19 (×4): 40 mg via SUBCUTANEOUS
  Filled 2022-09-16 (×4): qty 0.4

## 2022-09-16 NOTE — Progress Notes (Signed)
Progress Note   Patient: Donna Jacobs [id number]:[id number] DOB: [birth year] DOA: 09/13/2022     3 DOS: the patient was seen and examined on 09/16/2022      Subjective:  Patient seen and examined at bedside this morning PT OT work with patient with recommendation of SNF/acute rehab Transition of care manager aware and working on options Denies nausea vomiting abdominal pain chest pain or cough  Brief hospital course: From HPI "Karia Iseman is a 62 y.o. female with a known history of dementia, hyperlipidemia, rheumatoid arthritis, history of substance abuse presents to the emergency department for evaluation of ankle pain status post fall.  Patient was in a usual state of health until 8 days ago when she sustained a fall in her yard, was seen in the [facility] emergency department and found to have a bimalleolar fracture of the left ankle and was splinted.  She was instructed to follow-up with orthopedics but ended up going to podiatry today because of difficulty with weightbearing.  She was sent to the emergency department by her podiatrist for evaluation of the ankle pain.  "   Assessment and Plan: Acute trimalleolar fracture of the left ankle Patient with acute trimalleolar fracture of the left ankle s/p surgery Patient underwent surgical repair on 09/14/2022 Continue PT OT Since patient is not eating well we have placed on supplemental IV fluid PT OT work with patient with recommendation of SNF/acute rehab Transition of care manager aware and working on options Plan of care discussed with orthopedic surgery Continue as needed pain medication     #.  History of dementia - Continue donepezil and memantine   #.  History of hyperlipidemia - Continue ezetimibe     Diet/Nutrition: Heart healthy   Consults called: Ortho, PT, social work   DVT Px: Lovenox   Code Status: Full Code    Disposition Plan: To nursing facility/acute rehab       Physical Exam: General: Laying in bed  communicative in no acute distress HEENT: Head atraumatic, normocephalic. Pupils equal. Mucus membranes moist. NECK: Supple. No JVD. CHEST: Normal breath sounds bilaterally. No wheezing, rales, rhonchi or crackles. No use of accessory muscles of respiration.  No reproducible chest wall tenderness.  CARDIOVASCULAR: S1, S2 normal. No murmurs, rubs, or gallops. Cap refill <2 seconds. Pulses intact distally.  ABDOMEN: Soft, nondistended, nontender. No rebound, guarding, rigidity. Normoactive bowel sounds present in all four quadrants.  EXTREMITIES: Dressing noted to the left ankle is clean and dry NEUROLOGIC: Nonfocal awake and alert PSYCHIATRIC:  Normal affect, mood, thought content. SKIN: Warm, dry, and intact without obvious rash, lesion, or ulcer.     Data Reviewed: I have reviewed patient's CBC and BMP with results as documented below     Latest Ref Rng & Units 09/16/2022    4:49 AM 09/15/2022    1:27 AM 09/13/2022    9:59 PM  CBC  WBC 4.0 - 10.5 K/uL 8.3  10.0    Hemoglobin 12.0 - 15.0 g/dL 78.4  69.6  29.5   Hematocrit 36.0 - 46.0 % 33.3  39.6  41.0   Platelets 150 - 400 K/uL 203  237         Latest Ref Rng & Units 09/16/2022    4:49 AM 09/15/2022    1:27 AM 09/13/2022    9:59 PM  BMP  Glucose 70 - 99 mg/dL 284  132  440   BUN 6 - 20 mg/dL 15  12  17    Creatinine  0.44 - 1.00 mg/dL 3.08  6.57  8.46   Sodium 135 - 145 mmol/L 138  135  139   Potassium 3.5 - 5.1 mmol/L 3.6  3.7  3.7   Chloride 98 - 111 mmol/L 106  100  104   CO2 22 - 32 mmol/L 26  24    Calcium 8.9 - 10.3 mg/dL 8.0  8.6         Vitals:   09/15/22 0403 09/15/22 0730 09/16/22 0506 09/16/22 0722  BP: 133/80 133/80 (!) 147/69 (!) 150/79  Pulse: 72 70 86 88  Resp: 18 17 18 18   Temp: 97.7 F (36.5 C) 98.3 F (36.8 C) 99 F (37.2 C) 98.2 F (36.8 C)  TempSrc: Oral Oral Oral Oral  SpO2: 97% 99% 97% 100%  Weight:         Author: Loyce Dys, MD 09/16/2022 3:12 PM  For on call review [email or web address].

## 2022-09-16 NOTE — TOC Progression Note (Signed)
Transition of Care [facility]) - Progression Note    Patient Details  Name: Donna Jacobs MRN: [id number] Date of Birth: Dec 07, 1960  Transition of Care [facility]) CM/SW Contact  Jimmy Picket, [place] Phone Number: 09/16/2022, 4:16 PM  Clinical Narrative:     Pt has no bed offers at this time.   Expected Discharge Plan: Skilled Nursing Facility Barriers to Discharge: Continued Medical Work up, SNF Pending bed offer  Expected Discharge Plan and Services In-house Referral: Clinical Social Work   Post Acute Care Choice: Skilled Nursing Facility Living arrangements for the past 2 months: Single Family Home                                       Social Determinants of Health (SDOH) Interventions SDOH Screenings   Tobacco Use: Medium Risk (09/14/2022)    Readmission Risk Interventions     No data to display

## 2022-09-17 ENCOUNTER — Encounter (HOSPITAL_COMMUNITY): Payer: Self-pay | Admitting: Orthopedic Surgery

## 2022-09-17 DIAGNOSIS — S82852S Displaced trimalleolar fracture of left lower leg, sequela: Secondary | ICD-10-CM | POA: Diagnosis not present

## 2022-09-17 LAB — BASIC METABOLIC PANEL
Anion gap: 8 (ref 5–15)
BUN: 12 mg/dL (ref 6–20)
CO2: 24 mmol/L (ref 22–32)
Calcium: 8.4 mg/dL — ABNORMAL LOW (ref 8.9–10.3)
Chloride: 106 mmol/L (ref 98–111)
Creatinine, Ser: 0.61 mg/dL (ref 0.44–1.00)
GFR, Estimated: >60 mL/min (ref >60)
Glucose, Bld: 102 mg/dL — ABNORMAL HIGH (ref 70–99)
Potassium: 3.4 mmol/L — ABNORMAL LOW (ref 3.5–5.1)
Sodium: 138 mmol/L (ref 135–145)

## 2022-09-17 LAB — CBC
HCT: 35.1 % — ABNORMAL LOW (ref 36.0–46.0)
Hemoglobin: 11.6 g/dL — ABNORMAL LOW (ref 12.0–15.0)
MCH: 29.3 pg (ref 26.0–34.0)
MCHC: 33 g/dL (ref 30.0–36.0)
MCV: 88.6 fL (ref 80.0–100.0)
Platelets: 232 10*3/uL (ref 150–400)
RBC: 3.96 MIL/uL (ref 3.87–5.11)
RDW: 13.1 % (ref 11.5–15.5)
WBC: 9.7 10*3/uL (ref 4.0–10.5)
nRBC: 0 % (ref 0.0–0.2)

## 2022-09-17 MED ORDER — POTASSIUM CHLORIDE CRYS ER 20 MEQ PO TBCR
40.0000 meq | EXTENDED_RELEASE_TABLET | Freq: Two times a day (BID) | ORAL | Status: AC
  Administered 2022-09-17 (×2): 40 meq via ORAL
  Filled 2022-09-17 (×2): qty 2

## 2022-09-17 NOTE — Care Management Important Message (Signed)
Important Message  Patient Details  Name: Donna Jacobs MRN: [id number] Date of Birth: 1960-02-11   Medicare Important Message Given:  Yes     Deeandra Heling 09/17/2022, 3:03 PM

## 2022-09-17 NOTE — TOC Progression Note (Addendum)
Transition of Care [facility]) - Progression Note    Patient Details  Name: Donna Jacobs MRN: [id number] Date of Birth: 10-22-1960  [facility]) CM/SW Contact  Lorri Frederick, LCSW Phone Number: 09/17/2022, 1:23 PM  Clinical Narrative:   No bed offers for pt.  CSW reached out to multiple SNFs that had not yet responded.  0900: [facility], [facility], [address], [place] have all declined.    1315: CSW reached out to [facility] and [facility] requesting they review referral.    1350: Daughter Winter updated.  1600: [facility] cannot offer.    Referral sent out to more SNF options.   Expected Discharge Plan: Skilled Nursing Facility Barriers to Discharge: Continued Medical Work up, SNF Pending bed offer  Expected Discharge Plan and Services In-house Referral: Clinical Social Work   Post Acute Care Choice: Skilled Nursing Facility Living arrangements for the past 2 months: Single Family Home                                       Social Determinants of Health (SDOH) Interventions SDOH Screenings   Tobacco Use: Medium Risk (09/14/2022)    Readmission Risk Interventions     No data to display

## 2022-09-17 NOTE — Anesthesia Postprocedure Evaluation (Signed)
Anesthesia Post Note  Patient: VAISHNAVI KINDIG  Procedure(s) Performed: OPEN REDUCTION INTERNAL FIXATION (ORIF) ANKLE FRACTURE (Left: Ankle)     Patient location during evaluation: PACU Anesthesia Type: General Level of consciousness: awake and alert Pain management: pain level controlled Vital Signs Assessment: post-procedure vital signs reviewed and stable Respiratory status: spontaneous breathing, nonlabored ventilation and respiratory function stable Cardiovascular status: blood pressure returned to baseline and stable Postop Assessment: no apparent nausea or vomiting Anesthetic complications: no   No notable events documented.  Last Vitals:  Vitals:   09/17/22 0822 09/17/22 1514  BP: (!) 175/79 (!) 162/91  Pulse: 92 94  Resp: 18 17  Temp: 37.1 C 37.7 C  SpO2: 99% 100%    Last Pain:  Vitals:   09/17/22 1514  TempSrc: Oral  PainSc:    Pain Goal: Patients Stated Pain Goal: 0 (09/14/22 2100)                 Lowella Curb

## 2022-09-17 NOTE — Plan of Care (Signed)
  Problem: Pain Managment: Goal: General experience of comfort will improve Outcome: Progressing   

## 2022-09-17 NOTE — Plan of Care (Signed)

## 2022-09-17 NOTE — Progress Notes (Signed)
Patient ID: Donna Jacobs, female   DOB: [birth year], 62 y.o.   MRN: [id number] Left ankle dressing clean and dry and elevated.  Plan for discharge to skilled nursing.

## 2022-09-17 NOTE — Progress Notes (Signed)
Progress Note   Patient: Donna Jacobs QMV:[id number] DOB: [birth year] DOA: 09/13/2022     4 DOS: the patient was seen and examined on 09/17/2022     Subjective:  Patient seen and examined at bedside this morning in the presence of the husband Transition of care manager working on placement At this time no bed offers obtained Denies nausea vomiting abdominal pain chest pain or cough   Brief hospital course: From HPI "Kahmya Padalino is a 62 y.o. female with a known history of dementia, hyperlipidemia, rheumatoid arthritis, history of substance abuse presents to the emergency department for evaluation of ankle pain status post fall.  Patient was in a usual state of health until 8 days ago when she sustained a fall in her yard, was seen in the [facility] emergency department and found to have a bimalleolar fracture of the left ankle and was splinted.  She was instructed to follow-up with orthopedics but ended up going to podiatry today because of difficulty with weightbearing.  She was sent to the emergency department by her podiatrist for evaluation of the ankle pain.  "   Assessment and Plan: Acute trimalleolar fracture of the left ankle Patient with acute trimalleolar fracture of the left ankle s/p surgery Patient underwent surgical repair on 09/14/2022 Continue PT OT PT OT work with patient with recommendation of SNF/acute rehab Transition of care manager working on placement At this time no bed offers obtained Plan discussed with orthopedics and transition of care manager Continue as needed pain medication     #.  History of dementia Continue donepezil and memantine   #.  History of hyperlipidemia Continue ezetimibe     Diet/Nutrition: Heart healthy   Consults called: Ortho, PT, social work   DVT Px: Lovenox   Code Status: Full Code    Disposition Plan: To nursing facility/acute rehab       Physical Exam: General: Sitting up in a chair in no acute distress HEENT: Head  atraumatic, normocephalic. Pupils equal. Mucus membranes moist. NECK: Supple. No JVD. CHEST: Normal breath sounds bilaterally. No wheezing, rales, rhonchi or crackles. No use of accessory muscles of respiration.  No reproducible chest wall tenderness.  CARDIOVASCULAR: S1, S2 normal. No murmurs, rubs, or gallops. Cap refill <2 seconds. Pulses intact distally.  ABDOMEN: Soft, nondistended, nontender. No rebound, guarding, rigidity. Normoactive bowel sounds present in all four quadrants.  EXTREMITIES: Dressing noted to the left ankle is clean and dry NEUROLOGIC: Nonfocal awake and alert PSYCHIATRIC:  Normal affect, mood, thought content. SKIN: Warm, dry, and intact without obvious rash, lesion, or ulcer.     Data Reviewed: I have reviewed patient's CBC, BMP, as documented below as well as orthopedics documentation         Latest Ref Rng & Units 09/17/2022    3:39 AM 09/16/2022    4:49 AM 09/15/2022    1:27 AM  CBC  WBC 4.0 - 10.5 K/uL 9.7  8.3  10.0   Hemoglobin 12.0 - 15.0 g/dL 28.4  13.2  44.0   Hematocrit 36.0 - 46.0 % 35.1  33.3  39.6   Platelets 150 - 400 K/uL 232  203  237        Latest Ref Rng & Units 09/17/2022    3:39 AM 09/16/2022    4:49 AM 09/15/2022    1:27 AM  BMP  Glucose 70 - 99 mg/dL 102  725  366   BUN 6 - 20 mg/dL 12  15  12  Creatinine 0.44 - 1.00 mg/dL 1.61  0.96  0.45   Sodium 135 - 145 mmol/L 138  138  135   Potassium 3.5 - 5.1 mmol/L 3.4  3.6  3.7   Chloride 98 - 111 mmol/L 106  106  100   CO2 22 - 32 mmol/L 24  26  24    Calcium 8.9 - 10.3 mg/dL 8.4  8.0  8.6     Vitals:   09/16/22 2011 09/17/22 0336 09/17/22 0822 09/17/22 1514  BP: (!) 129/56 135/81 (!) 175/79 (!) 162/91  Pulse: 97 87 92 94  Resp: 18 18 18 17   Temp: 97.9 F (36.6 C) (!) 97.3 F (36.3 C) 98.8 F (37.1 C) 99.8 F (37.7 C)  TempSrc: Oral Oral Oral Oral  SpO2: 100% 100% 99% 100%  Weight:         Author: Loyce Dys, MD 09/17/2022 4:31 PM  For on call review  [email or web address].

## 2022-09-17 NOTE — Progress Notes (Signed)
Mobility Specialist: Progress Note   09/17/22 1213  Mobility  Activity Ambulated with assistance in hallway  Level of Assistance Moderate assist, patient does 50-74% (+2)  Assistive Device Other (Comment) (HHA)  LLE Weight Bearing WBAT  Activity Response Tolerated well  Mobility Referral Yes  $Mobility charge 1 Mobility  Mobility Specialist Start Time (ACUTE ONLY) 1155  Mobility Specialist Stop Time (ACUTE ONLY) 1210  Mobility Specialist Time Calculation (min) (ACUTE ONLY) 15 min    Pt was agreeable to mobility session - received in bed. No c/o throughout. Donned CAM boot. ModA for bed mobility, modA to stand, modA +2 via HHA to take steps to chair. Pt with difficulty WB through LLE. Left in chair with all needs met, chair alarm on. Call bell in reach.  Maurene Capes Mobility Specialist Please contact via SecureChat or Rehab office at [phone]

## 2022-09-18 DIAGNOSIS — S82852D Displaced trimalleolar fracture of left lower leg, subsequent encounter for closed fracture with routine healing: Secondary | ICD-10-CM

## 2022-09-18 LAB — BASIC METABOLIC PANEL
Anion gap: 8 (ref 5–15)
BUN: 16 mg/dL (ref 6–20)
CO2: 24 mmol/L (ref 22–32)
Calcium: 8.5 mg/dL — ABNORMAL LOW (ref 8.9–10.3)
Chloride: 102 mmol/L (ref 98–111)
Creatinine, Ser: 0.64 mg/dL (ref 0.44–1.00)
GFR, Estimated: >60 mL/min (ref >60)
Glucose, Bld: 108 mg/dL — ABNORMAL HIGH (ref 70–99)
Potassium: 4.3 mmol/L (ref 3.5–5.1)
Sodium: 134 mmol/L — ABNORMAL LOW (ref 135–145)

## 2022-09-18 LAB — CBC
HCT: 35.5 % — ABNORMAL LOW (ref 36.0–46.0)
Hemoglobin: 11.9 g/dL — ABNORMAL LOW (ref 12.0–15.0)
MCH: 30 pg (ref 26.0–34.0)
MCHC: 33.5 g/dL (ref 30.0–36.0)
MCV: 89.4 fL (ref 80.0–100.0)
Platelets: 245 10*3/uL (ref 150–400)
RBC: 3.97 MIL/uL (ref 3.87–5.11)
RDW: 13 % (ref 11.5–15.5)
WBC: 10.9 10*3/uL — ABNORMAL HIGH (ref 4.0–10.5)
nRBC: 0 % (ref 0.0–0.2)

## 2022-09-18 NOTE — Progress Notes (Signed)
Occupational Therapy Treatment Patient Details Name: Donna Jacobs MRN: [id number] DOB: 1960-10-09 Today's Date: 09/18/2022   History of present illness Pt is a 62 year old female admitted on 09/13/22 who fell in her yard a little over a week prior to admission sustaining L trimalleolar fx. She was sent [facility] non weightbearing, family unable to manage her. Underwent ORIF 8/16. Pt with a known history of dementia, hyperlipidemia, rheumatoid arthritis, history of substance abuse.   OT comments  Pt in bed upon therapy arrival and agreeable to participate in OT tx session. Husband, daughter-in-law, and grandson present during session visiting. Session focused on bed mobility, activity tolerance, endurance, sitting/standing balance, and UE strength. Pt tolerated sitting EOB while completing UE exercises with resistance band. Due to dementia, pt required multimodel cueing to complete exercises with proper form and technique. More difficulty noted when overstimulated and provided too many cues at once. Pt overall is able to scoot in bed and along EOB well. Greatest difficulty with processing cues and directions. Family assisted with session as able. Pt continues to be appropriate for continued inpatient follow up therapy, <3 hours/day. OT will continue to follow patient acutely.       If plan is discharge home, recommend the following:  Two people to help with walking and/or transfers;Two people to help with bathing/dressing/bathroom;Assistance with cooking/housework;Assistance with feeding;Direct supervision/assist for medications management;Direct supervision/assist for financial management;Assist for transportation;Help with stairs or ramp for entrance   Equipment Recommendations  Other (comment) (defer to next venue of care)       Precautions / Restrictions Precautions Precautions: Fall;Other (comment) Precaution Comments: dementia Required Braces or Orthoses: Other Brace Other  Brace: Left CAM boot Restrictions Weight Bearing Restrictions: Yes LLE Weight Bearing: Weight bearing as tolerated Other Position/Activity Restrictions: transfers only, no gait training       Mobility Bed Mobility Overal bed mobility: Needs Assistance Bed Mobility: Sit to Supine, Supine to Sit     Supine to sit: Min assist, HOB elevated, Used rails Sit to supine: Min assist, Used rails   General bed mobility comments: increased time and multimodel cueing required to complete bed mobility. Assist provided for BLE on/off EOB.    Transfers Overall transfer level: Needs assistance Equipment used: Rolling walker (2 wheels), 2 person hand held assist Transfers: Sit to/from Stand Sit to Stand: Mod assist, From elevated surface           General transfer comment: Limited standing tolerance noted. Verbal and visual cues provided for sequencing, direction, and hand placement while utilizing RW.     Balance Overall balance assessment: History of Falls, Needs assistance Sitting-balance support: Feet supported Sitting balance-Leahy Scale: Fair Sitting balance - Comments: sitting EOB   Standing balance support: Bilateral upper extremity supported, During functional activity, Reliant on assistive device for balance Standing balance-Leahy Scale: Zero Standing balance comment: poor direction following requiring increased physical assist to maintain standing balance at EOB        ADL either performed or assessed with clinical judgement   ADL        Upper Body Dressing : Sitting;Maximal assistance             Cognition Arousal: Alert Behavior During Therapy: WFL for tasks assessed/performed Overall Cognitive Status: History of cognitive impairments - at baseline       General Comments: needs step by step, multimodal cues to follow commands        Exercises Shoulder Exercises Shoulder Extension: Strengthening, Both, 5 reps,  Seated, Theraband Theraband Level (Shoulder  Extension): Level 1 (Yellow) Other Exercises Other Exercises: seated, BUE, shoulder horizontal abduction/adduction, 1 set, 10X, yellow band            Pertinent Vitals/ Pain       Pain Assessment Pain Assessment: Faces Faces Pain Scale: Hurts whole lot Pain Location: LLE when weight bearing Pain Descriptors / Indicators: Grimacing Pain Intervention(s): Monitored during session, Repositioned         Frequency  Min 1X/week        Progress Toward Goals  OT Goals(current goals can now be found in the care plan section)  Progress towards OT goals: Progressing toward goals      AM-PAC OT "6 Clicks" Daily Activity     Outcome Measure   Help from another person eating meals?: A Little Help from another person taking care of personal grooming?: Total Help from another person toileting, which includes using toliet, bedpan, or urinal?: Total Help from another person bathing (including washing, rinsing, drying)?: A Lot Help from another person to put on and taking off regular upper body clothing?: A Lot Help from another person to put on and taking off regular lower body clothing?: Total 6 Click Score: 10    End of Session Equipment Utilized During Treatment: Gait belt;Rolling walker (2 wheels)  OT Visit Diagnosis: Unsteadiness on feet (R26.81);Muscle weakness (generalized) (M62.81);History of falling (Z91.81);Other symptoms and signs involving cognitive function   Activity Tolerance Patient tolerated treatment well   Patient Left in bed;with call bell/phone within reach;with bed alarm set;with family/visitor present           Time: 7829-5621 OT Time Calculation (min): 30 min  Charges: OT General Charges $OT Visit: 1 Visit OT Treatments $Therapeutic Activity: 8-22 mins $Therapeutic Exercise: 8-22 mins  Limmie Patricia, OTR/L,CBIS  Supplemental OT - [facility] and [facility] Secure Chat Preferred    Valaria Kohut, Charisse March 09/18/2022, 7:39 PM

## 2022-09-18 NOTE — TOC Progression Note (Signed)
Transition of Care [facility]) - Progression Note    Patient Details  Name: Donna Jacobs MRN: [id number] Date of Birth: 1960/06/07  Transition of Care [facility]) CM/SW Contact  Lorri Frederick, LCSW Phone Number: 09/18/2022, 10:03 AM  Clinical Narrative:     CSW spoke with daughter Winter.  Updated that [facility] cannot offer.  4 new SNF offers provided, daughter would like to stop by and visit [facility] and [place].    Expected Discharge Plan: Skilled Nursing Facility Barriers to Discharge: Continued Medical Work up, SNF Pending bed offer  Expected Discharge Plan and Services In-house Referral: Clinical Social Work   Post Acute Care Choice: Skilled Nursing Facility Living arrangements for the past 2 months: Single Family Home                                       Social Determinants of Health (SDOH) Interventions SDOH Screenings   Tobacco Use: Medium Risk (09/14/2022)    Readmission Risk Interventions     No data to display

## 2022-09-18 NOTE — Plan of Care (Signed)

## 2022-09-18 NOTE — Progress Notes (Signed)
PROGRESS NOTE  Donna Jacobs YQM:[id number] DOB: 15-Dec-1960 DOA: 09/13/2022 PCP: Catha Gosselin, MD   LOS: 5 days   Brief Narrative / Interim history: 62 y.o. female with a known history of dementia, hyperlipidemia, rheumatoid arthritis, history of substance abuse presents to the emergency department for evaluation of ankle pain status post fall.  She was found to have trimalleolar fracture of the left ankle, status post surgical repair 8/16  Subjective / 24h Interval events: Pleasant, underlying dementia.  Tells me she had a fall and had surgery.  Cannot contribute much more than that to the story  Assesement and Plan: Principal Problem:   Trimalleolar fracture of ankle, closed Active Problems:   Trimalleolar fracture of ankle, closed, left, sequela  Principal problem Acute left ankle trimalleolar fracture -orthopedic surgery consulted, s/p surgical repair 8/16.  Recommendations are for SNF, she is medically ready but we are waiting on a bed  Active problems Dementia, early onset Alzheimer's-continue home medications  Hyperlipidemia-continue home medications  Scheduled Meds:  Chlorhexidine Gluconate Cloth  6 each Topical Q0600   docusate sodium  100 mg Oral BID   donepezil  10 mg Oral QHS   enoxaparin (LOVENOX) injection  40 mg Subcutaneous Q24H   ezetimibe  10 mg Oral Daily   feeding supplement  237 mL Oral BID BM   memantine  10 mg Oral BID   multivitamin with minerals  1 tablet Oral Daily   mupirocin ointment  1 Application Nasal BID   nutrition supplement (JUVEN)  1 packet Oral BID BM   rosuvastatin  10 mg Oral QHS   Continuous Infusions:  sodium chloride 10 mL/hr at 09/16/22 2207   methocarbamol (ROBAXIN) IV     PRN Meds:.acetaminophen, bisacodyl, bisacodyl, HYDROcodone-acetaminophen, magnesium citrate, methocarbamol **OR** methocarbamol (ROBAXIN) IV, metoCLOPramide **OR** metoCLOPramide (REGLAN) injection, ondansetron **OR** ondansetron (ZOFRAN) IV, polyethylene  glycol, senna-docusate, sodium phosphate  Current Outpatient Medications  Medication Instructions   donepezil (ARICEPT) 10 MG tablet TAKE 1 TABLET BY MOUTH AT BEDTIME   ezetimibe (ZETIA) 10 mg, Daily   HYDROcodone-acetaminophen (NORCO/VICODIN) 5-325 MG tablet 2 tablets, Oral, Every 4 hours PRN   memantine (NAMENDA) 10 mg, Oral, 2 times daily   Multiple Vitamins-Minerals (MULTIVITAMIN WOMEN) TABS 1 tablet, Oral, Daily   naproxen (NAPROSYN) 375 mg, Oral, 2 times daily   rosuvastatin (CRESTOR) 10 mg, Oral, Daily    Diet Orders (From admission, onward)     Start     Ordered   09/14/22 1621  Diet regular Room service appropriate? Yes; Fluid consistency: Thin  Diet effective now       Question Answer Comment  Room service appropriate? Yes   Fluid consistency: Thin      09/14/22 1620            DVT prophylaxis: enoxaparin (LOVENOX) injection 40 mg Start: 09/16/22 1615 SCDs Start: 09/14/22 1621 SCDs Start: 09/14/22 0136   Lab Results  Component Value Date   PLT 245 09/18/2022      Code Status: Full Code  Family Communication: no family at bedside  Status is: Inpatient Remains inpatient appropriate because: awaiting SNF   Level of care: Med-Surg  Consultants:  Orthopedic surgery   Objective: Vitals:   09/17/22 1514 09/17/22 1935 09/18/22 0340 09/18/22 0750  BP: (!) 162/91 (!) 140/83 139/86 121/74  Pulse: 94 96 87 95  Resp: 17 17 17 20   Temp: 99.8 F (37.7 C) 98 F (36.7 C) 98 F (36.7 C) 98.7 F (37.1 C)  TempSrc: Oral  Oral  SpO2: 100% 100% 98% 98%  Weight:        Intake/Output Summary (Last 24 hours) at 09/18/2022 1053 Last data filed at 09/18/2022 0136 Gross per 24 hour  Intake --  Output 1300 ml  Net -1300 ml   Wt Readings from Last 3 Encounters:  09/14/22 81.4 kg  09/05/22 81.4 kg  10/25/21 81.4 kg    Examination:  Constitutional: NAD Eyes: no scleral icterus ENMT: Mucous membranes are moist.  Neck: normal, supple Respiratory: clear to  auscultation bilaterally, no wheezing, no crackles. Cardiovascular: Regular rate and rhythm, no murmurs / rubs / gallops. No LE edema.  Abdomen: non distended, no tenderness. Bowel sounds positive.  Musculoskeletal: no clubbing / cyanosis.   Data Reviewed: I have independently reviewed following labs and imaging studies   CBC Recent Labs  Lab 09/13/22 2154 09/13/22 2159 09/15/22 0127 09/16/22 0449 09/17/22 0339 09/18/22 0122  WBC 8.5  --  10.0 8.3 9.7 10.9*  HGB 13.9 13.9 13.2 11.1* 11.6* 11.9*  HCT 41.3 41.0 39.6 33.3* 35.1* 35.5*  PLT 251  --  237 203 232 245  MCV 89.0  --  87.6 90.2 88.6 89.4  MCH 30.0  --  29.2 30.1 29.3 30.0  MCHC 33.7  --  33.3 33.3 33.0 33.5  RDW 12.6  --  12.7 12.9 13.1 13.0  LYMPHSABS 1.8  --   --   --   --   --   MONOABS 0.6  --   --   --   --   --   EOSABS 0.1  --   --   --   --   --   BASOSABS 0.1  --   --   --   --   --     Recent Labs  Lab 09/13/22 2154 09/13/22 2159 09/13/22 2352 09/15/22 0127 09/16/22 0449 09/17/22 0339 09/18/22 0122  NA 137 139  --  135 138 138 134*  K 3.7 3.7  --  3.7 3.6 3.4* 4.3  CL 103 104  --  100 106 106 102  CO2 24  --   --  24 26 24 24   GLUCOSE 144* 140*  --  107* 107* 102* 108*  BUN 17 17  --  12 15 12 16   CREATININE 0.67 0.60  --  0.59 0.67 0.61 0.64  CALCIUM 8.8*  --   --  8.6* 8.0* 8.4* 8.5*  INR  --   --  1.2  --   --   --   --     ------------------------------------------------------------------------------------------------------------------ No results for input(s): "CHOL", "HDL", "LDLCALC", "TRIG", "CHOLHDL", "LDLDIRECT" in the last 72 hours.  No results found for: "HGBA1C" ------------------------------------------------------------------------------------------------------------------ No results for input(s): "TSH", "T4TOTAL", "T3FREE", "THYROIDAB" in the last 72 hours.  Invalid input(s): "FREET3"  Cardiac Enzymes No results for input(s): "CKMB", "TROPONINI", "MYOGLOBIN" in the last 168  hours.  Invalid input(s): "CK" ------------------------------------------------------------------------------------------------------------------ No results found for: "BNP"  CBG: No results for input(s): "GLUCAP" in the last 168 hours.  Recent Results (from the past 240 hour(s))  Surgical pcr screen     Status: Abnormal   Collection Time: 09/14/22  9:34 AM   Specimen: Nasal Mucosa; Nasal Swab  Result Value Ref Range Status   MRSA, PCR NEGATIVE NEGATIVE Final   Staphylococcus aureus POSITIVE (A) NEGATIVE Final    Comment: (NOTE) The Xpert SA Assay (FDA approved for NASAL specimens in patients 27 years of age and older), is one component of a  comprehensive surveillance program. It is not intended to diagnose infection nor to guide or monitor treatment. Performed at [facility], [address], [place], [place] [address]      Radiology Studies: No results found.   Pamella Pert, MD, PhD [facility]  Between 7 am - 7 pm I am available, please contact me via Amion (for emergencies) or Securechat (non urgent messages)  Between 7 pm - 7 am I am not available, please contact night coverage MD/APP via Amion

## 2022-09-18 NOTE — Progress Notes (Signed)
Physical Therapy Treatment Patient Details Name: Donna Jacobs MRN: [id number] DOB: 01-26-1961 Today's Date: 09/18/2022   History of Present Illness Pt is a 62 year old female admitted on 09/13/22 who fell in her yard a little over a week prior to admission sustaining L trimalleolar fx. She was sent home from [facility] non weightbearing, family unable to manage her. Underwent ORIF 8/16. Pt with a known history of dementia, hyperlipidemia, rheumatoid arthritis, history of substance abuse.    PT Comments  Pt tolerated treatment well today. Pt with similar presentation to previous sessions continuing to require +2 Mod A to the chair. No change in DC/DME recs at this time. PT will continue to follow.    If plan is discharge home, recommend the following: A lot of help with walking and/or transfers;A lot of help with bathing/dressing/bathroom;Assistance with cooking/housework;Direct supervision/assist for medications management;Help with stairs or ramp for entrance;Assist for transportation;Direct supervision/assist for financial management;Supervision due to cognitive status   Can travel by private vehicle     No  Equipment Recommendations  Other (comment) (Per accepting facility)    Recommendations for Other Services       Precautions / Restrictions Precautions Precautions: Fall Required Braces or Orthoses: Other Brace Other Brace: L CAM boot Restrictions Weight Bearing Restrictions: Yes LLE Weight Bearing: Weight bearing as tolerated Other Position/Activity Restrictions: transfers only, no gait training     Mobility  Bed Mobility Overal bed mobility: Needs Assistance Bed Mobility: Supine to Sit     Supine to sit: Contact guard     General bed mobility comments: Pt able to come to EOB on her own. CGA for safety.    Transfers Overall transfer level: Needs assistance Equipment used: 2 person hand held assist Transfers: Sit to/from Stand, Bed to chair/wheelchair/BSC Sit to  Stand: +2 physical assistance, Mod assist Stand pivot transfers: +2 physical assistance, Mod assist         General transfer comment: difficulty stepping with L LE, assist to rise and steady    Ambulation/Gait               General Gait Details: Transfer only per surgeon   Stairs             Wheelchair Mobility     Tilt Bed    Modified Rankin (Stroke Patients Only)       Balance Overall balance assessment: History of Falls, Needs assistance Sitting-balance support: Feet supported Sitting balance-Leahy Scale: Fair     Standing balance support: Bilateral upper extremity supported, During functional activity, Reliant on assistive device for balance Standing balance-Leahy Scale: Poor Standing balance comment: Reliant on RW. Poor command following                            Cognition Arousal: Alert Behavior During Therapy: Flat affect Overall Cognitive Status: History of cognitive impairments - at baseline                                 General Comments: needs step by step, multimodal cues to follow commands        Exercises      General Comments General comments (skin integrity, edema, etc.): VSS      Pertinent Vitals/Pain Pain Assessment Pain Assessment: Faces Faces Pain Scale: Hurts even more Pain Location: L LE Pain Descriptors / Indicators: Sore Pain Intervention(s): Monitored during session, Limited  activity within patient's tolerance, Repositioned    Home Living                          Prior Function            PT Goals (current goals can now be found in the care plan section) Progress towards PT goals: Progressing toward goals    Frequency    Min 1X/week      PT Plan      Co-evaluation              AM-PAC PT "6 Clicks" Mobility   Outcome Measure  Help needed turning from your back to your side while in a flat bed without using bedrails?: A Little Help needed moving from  lying on your back to sitting on the side of a flat bed without using bedrails?: A Little Help needed moving to and from a bed to a chair (including a wheelchair)?: A Lot Help needed standing up from a chair using your arms (e.g., wheelchair or bedside chair)?: A Lot Help needed to walk in hospital room?: Total Help needed climbing 3-5 steps with a railing? : Total 6 Click Score: 12    End of Session Equipment Utilized During Treatment: Gait belt Activity Tolerance: Patient tolerated treatment well;Patient limited by pain;Patient limited by fatigue Patient left: in chair;with call bell/phone within reach;with chair alarm set;with family/visitor present Nurse Communication: Mobility status PT Visit Diagnosis: Other abnormalities of gait and mobility (R26.89)     Time: 1610-9604 PT Time Calculation (min) (ACUTE ONLY): 13 min  Charges:    $Therapeutic Activity: 8-22 mins PT General Charges $$ ACUTE PT VISIT: 1 Visit                     Donna Jacobs, PT, DPT Acute Rehab Services [phone]    Donna Jacobs 09/18/2022, 1:00 PM

## 2022-09-18 NOTE — Progress Notes (Signed)
Physical Therapy Treatment Patient Details Name: Donna Jacobs MRN: [id number] DOB: May 09, 1960 Today's Date: 09/18/2022   History of Present Illness Pt is a 62 year old female admitted on 09/13/22 who fell in her yard a little over a week prior to admission sustaining L trimalleolar fx. She was sent home from [facility] non weightbearing, family unable to manage her. Underwent ORIF 8/16. Pt with a known history of dementia, hyperlipidemia, rheumatoid arthritis, history of substance abuse.    PT Comments  Pt received for second session to assist nursing staff with getting pt on BSC and back to bed. Pt this session required +2 Max A with transfers likely due to fatigue with zero command following again. No change in DC/DME recs at this time. PT will continue to follow.    If plan is discharge home, recommend the following: A lot of help with walking and/or transfers;A lot of help with bathing/dressing/bathroom;Assistance with cooking/housework;Direct supervision/assist for medications management;Help with stairs or ramp for entrance;Assist for transportation;Direct supervision/assist for financial management;Supervision due to cognitive status   Can travel by private vehicle     No  Equipment Recommendations  Other (comment) (Per accepting facility)    Recommendations for Other Services       Precautions / Restrictions Precautions Precautions: Fall Required Braces or Orthoses: Other Brace Other Brace: L CAM boot Restrictions Weight Bearing Restrictions: Yes LLE Weight Bearing: Weight bearing as tolerated Other Position/Activity Restrictions: transfers only, no gait training     Mobility  Bed Mobility Overal bed mobility: Needs Assistance Bed Mobility: Sit to Supine     Supine to sit: +2 for physical assistance, Max assist     General bed mobility comments: Unable to follow commands to lay down. +2 Max via helicopter method.    Transfers Overall transfer level: Needs  assistance Equipment used: 2 person hand held assist Transfers: Sit to/from Stand, Bed to chair/wheelchair/BSC Sit to Stand: +2 physical assistance, Mod assist Stand pivot transfers: +2 physical assistance, Max assist         General transfer comment: difficulty stepping with L LE, assist to rise and steady    Ambulation/Gait               General Gait Details: Transfer only per surgeon   Stairs             Wheelchair Mobility     Tilt Bed    Modified Rankin (Stroke Patients Only)       Balance Overall balance assessment: History of Falls, Needs assistance Sitting-balance support: Feet supported Sitting balance-Leahy Scale: Fair     Standing balance support: Bilateral upper extremity supported, During functional activity, Reliant on assistive device for balance Standing balance-Leahy Scale: Poor Standing balance comment: Reliant on RW. Poor command following                            Cognition Arousal: Alert Behavior During Therapy: Flat affect Overall Cognitive Status: History of cognitive impairments - at baseline                                 General Comments: needs step by step, multimodal cues to follow commands        Exercises      General Comments General comments (skin integrity, edema, etc.): VSS      Pertinent Vitals/Pain Pain Assessment Pain Assessment: Faces Faces Pain  Scale: Hurts even more Pain Location: L LE Pain Descriptors / Indicators: Sore Pain Intervention(s): Monitored during session, Limited activity within patient's tolerance, Repositioned    Home Living                          Prior Function            PT Goals (current goals can now be found in the care plan section) Progress towards PT goals: Progressing toward goals    Frequency    Min 1X/week      PT Plan      Co-evaluation              AM-PAC PT "6 Clicks" Mobility   Outcome Measure  Help  needed turning from your back to your side while in a flat bed without using bedrails?: A Little Help needed moving from lying on your back to sitting on the side of a flat bed without using bedrails?: A Little Help needed moving to and from a bed to a chair (including a wheelchair)?: A Lot Help needed standing up from a chair using your arms (e.g., wheelchair or bedside chair)?: A Lot Help needed to walk in hospital room?: Total Help needed climbing 3-5 steps with a railing? : Total 6 Click Score: 12    End of Session Equipment Utilized During Treatment: Gait belt Activity Tolerance: Patient tolerated treatment well;Patient limited by pain;Patient limited by fatigue Patient left: in chair;with call bell/phone within reach;with chair alarm set;with family/visitor present Nurse Communication: Mobility status PT Visit Diagnosis: Other abnormalities of gait and mobility (R26.89)     Time: 5366-4403 PT Time Calculation (min) (ACUTE ONLY): 33 min  Charges:    $Therapeutic Activity: 23-37 mins PT General Charges $$ ACUTE PT VISIT: 1 Visit                     Shela Nevin, PT, DPT Acute Rehab Services [phone]    [name] 09/18/2022, 3:46 PM

## 2022-09-19 ENCOUNTER — Telehealth: Payer: Self-pay | Admitting: Orthopedic Surgery

## 2022-09-19 DIAGNOSIS — G300 Alzheimer's disease with early onset: Secondary | ICD-10-CM

## 2022-09-19 DIAGNOSIS — S82852S Displaced trimalleolar fracture of left lower leg, sequela: Secondary | ICD-10-CM | POA: Diagnosis not present

## 2022-09-19 DIAGNOSIS — S82852D Displaced trimalleolar fracture of left lower leg, subsequent encounter for closed fracture with routine healing: Secondary | ICD-10-CM | POA: Diagnosis not present

## 2022-09-19 DIAGNOSIS — F0390 Unspecified dementia without behavioral disturbance: Secondary | ICD-10-CM

## 2022-09-19 DIAGNOSIS — E785 Hyperlipidemia, unspecified: Secondary | ICD-10-CM

## 2022-09-19 LAB — BASIC METABOLIC PANEL
Anion gap: 9 (ref 5–15)
BUN: 11 mg/dL (ref 6–20)
CO2: 26 mmol/L (ref 22–32)
Calcium: 8.4 mg/dL — ABNORMAL LOW (ref 8.9–10.3)
Chloride: 102 mmol/L (ref 98–111)
Creatinine, Ser: 0.86 mg/dL (ref 0.44–1.00)
GFR, Estimated: >60 mL/min (ref >60)
Glucose, Bld: 100 mg/dL — ABNORMAL HIGH (ref 70–99)
Potassium: 3.9 mmol/L (ref 3.5–5.1)
Sodium: 137 mmol/L (ref 135–145)

## 2022-09-19 NOTE — Progress Notes (Signed)
Physical Therapy Treatment Patient Details Name: Donna Jacobs MRN: [id number] DOB: 31-Dec-1960 Today's Date: 09/19/2022   History of Present Illness Pt is a 62 year old female admitted on 09/13/22 who fell in her yard a little over a week prior to admission sustaining L trimalleolar fx. She was sent home from [facility] non weightbearing, family unable to manage her. Underwent ORIF 8/16. Pt with a known history of dementia, hyperlipidemia, rheumatoid arthritis, history of substance abuse.    PT Comments  Continuing work on functional mobility and activity tolerance;  Focus of this session was lateral scooting transfer, which pt performed much smoother with a more straight forward transfer technqiue, and the ability to see her target of transfer; with the right cues (simple, direct, not a lot of verbals) she shows good potential for improvement; noted plans to transition to post-acute rehab today    If plan is discharge home, recommend the following: A lot of help with walking and/or transfers;A lot of help with bathing/dressing/bathroom;Assistance with cooking/housework;Direct supervision/assist for medications management;Help with stairs or ramp for entrance;Assist for transportation;Direct supervision/assist for financial management;Supervision due to cognitive status   Can travel by private vehicle     No  Equipment Recommendations  Rolling walker (2 wheels);BSC/3in1;Wheelchair (measurements PT);Wheelchair cushion (measurements PT)    Recommendations for Other Services       Precautions / Restrictions Precautions Precautions: Fall;Other (comment) Precaution Comments: dementia Required Braces or Orthoses: Other Brace Other Brace: Left CAM boot Restrictions LLE Weight Bearing: Weight bearing as tolerated (in cam boot) Other Position/Activity Restrictions: transfers only, no gait training     Mobility  Bed Mobility Overal bed mobility: Needs Assistance Bed Mobility: Supine to Sit      Supine to sit: Min assist, HOB elevated, Used rails     General bed mobility comments: increased time and multimodel cueing required to complete bed mobility. Assist provided for BLE on/off EOB.    Transfers Overall transfer level: Needs assistance Equipment used:  (bed pads) Transfers: Bed to chair/wheelchair/BSC         Anterior-Posterior transfers: +2 physical assistance, Mod assist  Lateral/Scoot Transfers: Mod assist General transfer comment: trasnferred from tan recliner to drop-arm recliner with side of drop-arm recliner directly next to front edge of tan recliner seat; Daneka was able to perform multiple small scoots to get into blue drop-arm recliner with heavy cues for goal of transfer (tapping seat and back of seat to give her teh target goal)    Ambulation/Gait               General Gait Details: Transfer only per surgeon   Stairs             Wheelchair Mobility     Tilt Bed    Modified Rankin (Stroke Patients Only)       Balance Overall balance assessment: History of Falls, Needs assistance   Sitting balance-Leahy Scale: Fair                                      Cognition Arousal: Alert Behavior During Therapy: WFL for tasks assessed/performed Overall Cognitive Status: History of cognitive impairments - at baseline                                 General Comments: needs step by step, multimodal cues to follow  commands        Exercises      General Comments General comments (skin integrity, edema, etc.): Husband, [name] , present and helpful      Pertinent Vitals/Pain Pain Assessment Pain Assessment: Faces Faces Pain Scale: Hurts a little bit Pain Location: LLE Pain Descriptors / Indicators: Grimacing Pain Intervention(s): Monitored during session    Home Living                          Prior Function            PT Goals (current goals can now be found in the care plan  section) Acute Rehab PT Goals PT Goal Formulation: Patient unable to participate in goal setting Time For Goal Achievement: 09/28/22 Potential to Achieve Goals: Fair Progress towards PT goals: Progressing toward goals    Frequency    Min 1X/week      PT Plan      Co-evaluation              AM-PAC PT "6 Clicks" Mobility   Outcome Measure  Help needed turning from your back to your side while in a flat bed without using bedrails?: A Little Help needed moving from lying on your back to sitting on the side of a flat bed without using bedrails?: A Little Help needed moving to and from a bed to a chair (including a wheelchair)?: A Lot Help needed standing up from a chair using your arms (e.g., wheelchair or bedside chair)?: A Lot Help needed to walk in hospital room?: Total Help needed climbing 3-5 steps with a railing? : Total 6 Click Score: 12    End of Session Equipment Utilized During Treatment: Other (comment) (bed pads) Activity Tolerance: Patient tolerated treatment well;Patient limited by pain;Patient limited by fatigue Patient left: in chair;with call bell/phone within reach;with chair alarm set;with family/visitor present Nurse Communication: Mobility status PT Visit Diagnosis: Other abnormalities of gait and mobility (R26.89)     Time: 1610-9604 PT Time Calculation (min) (ACUTE ONLY): 16 min  Charges:    $Therapeutic Activity: 8-22 mins PT General Charges $$ ACUTE PT VISIT: 1 Visit                     Van Clines, PT  Acute Rehabilitation Services Office [phone] Secure Chat welcomed    Levi Aland 09/19/2022, 3:52 PM

## 2022-09-19 NOTE — Progress Notes (Signed)
Physical Therapy Treatment Patient Details Name: Donna Jacobs MRN: [id number] DOB: 1960-09-13 Today's Date: 09/19/2022   History of Present Illness Pt is a 62 year old female admitted on 09/13/22 who fell in her yard a little over a week prior to admission sustaining L trimalleolar fx. She was sent home from [facility] non weightbearing, family unable to manage her. Underwent ORIF 8/16. Pt with a known history of dementia, hyperlipidemia, rheumatoid arthritis, history of substance abuse.    PT Comments  Continuing work on functional mobility and activity tolerance;  Session focused on functional transfers; Opted to try the anterior-posterior transfer for OOB to recliner, as she scoots in the bed overall well, and has considerable pain and difficulty with LLE weight bearing; Kalliyan tries very hard, and scoots well; needed max/total directional cues for he very novel transfer of "backing into" the recliner; fatigued after getting into the recliner; will look for a drop-arm recliner for better ease with bed to recliner transfers   If plan is discharge home, recommend the following: A lot of help with walking and/or transfers;A lot of help with bathing/dressing/bathroom;Assistance with cooking/housework;Direct supervision/assist for medications management;Help with stairs or ramp for entrance;Assist for transportation;Direct supervision/assist for financial management;Supervision due to cognitive status   Can travel by private vehicle     No  Equipment Recommendations  Rolling walker (2 wheels);BSC/3in1;Wheelchair (measurements PT);Wheelchair cushion (measurements PT)    Recommendations for Other Services       Precautions / Restrictions Precautions Precautions: Fall;Other (comment) Precaution Comments: dementia Required Braces or Orthoses: Other Brace Other Brace: Left CAM boot Restrictions LLE Weight Bearing: Weight bearing as tolerated Other Position/Activity Restrictions: transfers only,  no gait training     Mobility  Bed Mobility Overal bed mobility: Needs Assistance Bed Mobility: Supine to Sit     Supine to sit: Min assist, HOB elevated, Used rails     General bed mobility comments: increased time and multimodel cueing required to complete bed mobility. Assist provided for BLE on/off EOB.    Transfers Overall transfer level: Needs assistance Equipment used: 2 person hand held assist (bed pads) Transfers: Bed to chair/wheelchair/BSC         Anterior-Posterior transfers: +2 physical assistance, Mod assist   General transfer comment: Opted to try ant-post transfer OOB to recliner; Difficulty problem-solving with this novel way of transferring, but Guyneth was very willing to try; scooted in wrong direction a few times during transfer, but takes correction well; Better with simple commands    Ambulation/Gait               General Gait Details: Transfer only per surgeon   Stairs             Wheelchair Mobility     Tilt Bed    Modified Rankin (Stroke Patients Only)       Balance     Sitting balance-Leahy Scale: Fair                                      Cognition Arousal: Alert Behavior During Therapy: WFL for tasks assessed/performed Overall Cognitive Status: History of cognitive impairments - at baseline                                 General Comments: needs step by step, multimodal cues to follow commands  Exercises      General Comments General comments (skin integrity, edema, etc.): VSS; noted purewick had leaked; assisted with cleaningup and changing pads at bed level      Pertinent Vitals/Pain Pain Assessment Pain Assessment: Faces Faces Pain Scale: Hurts a little bit Pain Location: LLE Pain Descriptors / Indicators: Grimacing Pain Intervention(s): Monitored during session    Home Living                          Prior Function            PT Goals (current  goals can now be found in the care plan section) Acute Rehab PT Goals PT Goal Formulation: Patient unable to participate in goal setting Time For Goal Achievement: 09/28/22 Potential to Achieve Goals: Poor Progress towards PT goals: Progressing toward goals    Frequency    Min 1X/week      PT Plan      Co-evaluation              AM-PAC PT "6 Clicks" Mobility   Outcome Measure  Help needed turning from your back to your side while in a flat bed without using bedrails?: A Little Help needed moving from lying on your back to sitting on the side of a flat bed without using bedrails?: A Little Help needed moving to and from a bed to a chair (including a wheelchair)?: A Lot Help needed standing up from a chair using your arms (e.g., wheelchair or bedside chair)?: A Lot Help needed to walk in hospital room?: Total Help needed climbing 3-5 steps with a railing? : Total 6 Click Score: 12    End of Session Equipment Utilized During Treatment: Other (comment) (bed pads) Activity Tolerance: Patient tolerated treatment well;Patient limited by pain;Patient limited by fatigue Patient left: in chair;with call bell/phone within reach;with chair alarm set;with family/visitor present Nurse Communication: Mobility status PT Visit Diagnosis: Other abnormalities of gait and mobility (R26.89)     Time: 1610-9604 PT Time Calculation (min) (ACUTE ONLY): 43 min  Charges:    $Therapeutic Activity: 38-52 mins PT General Charges $$ ACUTE PT VISIT: 1 Visit                     Van Clines, PT  Acute Rehabilitation Services Office [phone] Secure Chat welcomed    Levi Aland 09/19/2022, 2:17 PM

## 2022-09-19 NOTE — Progress Notes (Signed)
Mobility Specialist: Progress Note   09/19/22 1453  Mobility  Activity Transferred from chair to bed  Level of Assistance Moderate assist, patient does 50-74% (+2)  Assistive Device Other (Comment) (HHA)  LLE Weight Bearing WBAT  Activity Response Tolerated well  Mobility Referral Yes  $Mobility charge 1 Mobility  Mobility Specialist Start Time (ACUTE ONLY) 1438  Mobility Specialist Stop Time (ACUTE ONLY) 1447  Mobility Specialist Time Calculation (min) (ACUTE ONLY) 9 min    Pt was agreeable to mobility session - received in chair. Required max verbal and tactile cues to stay on task. Used lateral slide method to transfer. MinA for sit to supine. No complaints throughout. Left in bed with all needs met, call bell in reach. Family in room.   Maurene Capes Mobility Specialist Please contact via SecureChat or Rehab office at [phone]

## 2022-09-19 NOTE — TOC Progression Note (Addendum)
Transition of Care [facility]) - Progression Note    Patient Details  Name: Donna Jacobs MRN: [id number] Date of Birth: 1960/07/23  [facility] CM/SW Contact  Lorri Frederick, LCSW Phone Number: 09/19/2022, 8:53 AM  Clinical Narrative:   [name], daughter informed, will accept offer at [facility].  CSW confirmed with [facility] that they can receive pt today.  Auth request submitted in Navi.    1400: Auth request remains pending.   Expected Discharge Plan: Skilled Nursing Facility Barriers to Discharge: Continued Medical Work up, SNF Pending bed offer  Expected Discharge Plan and Services In-house Referral: Clinical Social Work   Post Acute Care Choice: Skilled Nursing Facility Living arrangements for the past 2 months: Single Family Home                                       Social Determinants of Health (SDOH) Interventions SDOH Screenings   Tobacco Use: Medium Risk (09/14/2022)    Readmission Risk Interventions     No data to display

## 2022-09-19 NOTE — Progress Notes (Signed)
PROGRESS NOTE    Donna Jacobs  KGM:[id number] DOB: February 10, 1960 DOA: 09/13/2022 PCP: Catha Gosselin, MD   Brief Narrative:  62 y.o. female with a known history of dementia, hyperlipidemia, rheumatoid arthritis, history of substance abuse presents to the emergency department for evaluation of ankle pain status post fall.  She was found to have trimalleolar fracture of the left ankle, status post surgical repair 8/16    Assessment & Plan:   Principal Problem:   Trimalleolar fracture of ankle, closed Active Problems:   Trimalleolar fracture of ankle, closed, left, sequela  Acute left ankle trimalleolar fracture -orthopedic surgery consulted, s/p surgical repair 8/16.  Recommendations are for SNF, she is medically ready but we are waiting on a bed   Dementia, early onset Alzheimer's-continue Namenda Hyperlipidemia-continue home medications Rheumatoid arthritis  Not currently on any medications per home med-rec History of substance abuse - former, no acute indications of substance abuse  DVT prophylaxis: enoxaparin (LOVENOX) injection 40 mg Start: 09/16/22 1615 SCDs Start: 09/14/22 1621 SCDs Start: 09/14/22 0136   Code Status:   Code Status: Full Code  Family Communication: At bedside  Status is: Inpt  Dispo: The patient is from: Home              Anticipated d/c is to: SNF              Anticipated d/c date is: Imminent              Patient currently IS medically stable for discharge  Consultants:  HemeOnc  Procedures:  ORIF 8/16 L anke trimalleolar fracture  Antimicrobials:  None  Subjective: No acute issues/events overnight  Objective: Vitals:   09/18/22 0750 09/18/22 1900 09/18/22 1921 09/19/22 0436  BP: 121/74  126/69 123/71  Pulse: 95  74 80  Resp: 20     Temp: 98.7 F (37.1 C) 98.6 F (37 C)  98.9 F (37.2 C)  TempSrc: Oral Oral  Oral  SpO2: 98%  95% 96%  Weight:        Intake/Output Summary (Last 24 hours) at 09/19/2022 0810 Last data filed at  09/19/2022 0153 Gross per 24 hour  Intake 480 ml  Output 1900 ml  Net -1420 ml   Filed Weights   09/14/22 0139  Weight: 81.4 kg    Examination:  General exam: Appears calm and comfortable  Respiratory system: Clear to auscultation. Respiratory effort normal. Cardiovascular system: S1 & S2 heard, RRR. No JVD, murmurs, rubs, gallops or clicks. No pedal edema. Gastrointestinal system: Abdomen is nondistended, soft and nontender. No organomegaly or masses felt. Normal bowel sounds heard. Central nervous system: Alert and oriented. No focal neurological deficits. Extremities: Symmetric 5 x 5 power. Skin: No rashes, lesions or ulcers Psychiatry: Judgement and insight appear normal. Mood & affect appropriate.     Data Reviewed: I have personally reviewed following labs and imaging studies  CBC: Recent Labs  Lab 09/13/22 2154 09/13/22 2159 09/15/22 0127 09/16/22 0449 09/17/22 0339 09/18/22 0122  WBC 8.5  --  10.0 8.3 9.7 10.9*  NEUTROABS 5.9  --   --   --   --   --   HGB 13.9 13.9 13.2 11.1* 11.6* 11.9*  HCT 41.3 41.0 39.6 33.3* 35.1* 35.5*  MCV 89.0  --  87.6 90.2 88.6 89.4  PLT 251  --  237 203 232 245   Basic Metabolic Panel: Recent Labs  Lab 09/15/22 0127 09/16/22 0449 09/17/22 0339 09/18/22 0122 09/19/22 0414  NA 135 138 138  134* 137  K 3.7 3.6 3.4* 4.3 3.9  CL 100 106 106 102 102  CO2 24 26 24 24 26   GLUCOSE 107* 107* 102* 108* 100*  BUN 12 15 12 16 11   CREATININE 0.59 0.67 0.61 0.64 0.86  CALCIUM 8.6* 8.0* 8.4* 8.5* 8.4*   GFR: Estimated Creatinine Clearance: 65.8 mL/min (by C-G formula based on SCr of 0.86 mg/dL). Liver Function Tests: No results for input(s): "AST", "ALT", "ALKPHOS", "BILITOT", "PROT", "ALBUMIN" in the last 168 hours. No results for input(s): "LIPASE", "AMYLASE" in the last 168 hours. No results for input(s): "AMMONIA" in the last 168 hours. Coagulation Profile: Recent Labs  Lab 09/13/22 2352  INR 1.2   Cardiac Enzymes: No  results for input(s): "CKTOTAL", "CKMB", "CKMBINDEX", "TROPONINI" in the last 168 hours. BNP (last 3 results) No results for input(s): "PROBNP" in the last 8760 hours. HbA1C: No results for input(s): "HGBA1C" in the last 72 hours. CBG: No results for input(s): "GLUCAP" in the last 168 hours. Lipid Profile: No results for input(s): "CHOL", "HDL", "LDLCALC", "TRIG", "CHOLHDL", "LDLDIRECT" in the last 72 hours. Thyroid Function Tests: No results for input(s): "TSH", "T4TOTAL", "FREET4", "T3FREE", "THYROIDAB" in the last 72 hours. Anemia Panel: No results for input(s): "VITAMINB12", "FOLATE", "FERRITIN", "TIBC", "IRON", "RETICCTPCT" in the last 72 hours. Sepsis Labs: No results for input(s): "PROCALCITON", "LATICACIDVEN" in the last 168 hours.  Recent Results (from the past 240 hour(s))  Surgical pcr screen     Status: Abnormal   Collection Time: 09/14/22  9:34 AM   Specimen: Nasal Mucosa; Nasal Swab  Result Value Ref Range Status   MRSA, PCR NEGATIVE NEGATIVE Final   Staphylococcus aureus POSITIVE (A) NEGATIVE Final    Comment: (NOTE) The Xpert SA Assay (FDA approved for NASAL specimens in patients 83 years of age and older), is one component of a comprehensive surveillance program. It is not intended to diagnose infection nor to guide or monitor treatment. Performed at [facility], [address], [place], [place] [address]          Radiology Studies: No results found.      Scheduled Meds:  Chlorhexidine Gluconate Cloth  6 each Topical Q0600   docusate sodium  100 mg Oral BID   donepezil  10 mg Oral QHS   enoxaparin (LOVENOX) injection  40 mg Subcutaneous Q24H   ezetimibe  10 mg Oral Daily   feeding supplement  237 mL Oral BID BM   memantine  10 mg Oral BID   multivitamin with minerals  1 tablet Oral Daily   mupirocin ointment  1 Application Nasal BID   nutrition supplement (JUVEN)  1 packet Oral BID BM   rosuvastatin  10 mg Oral QHS   Continuous  Infusions:  sodium chloride 10 mL/hr at 09/16/22 2207   methocarbamol (ROBAXIN) IV       LOS: 6 days   Time spent:  Azucena Fallen, DO [facility]  If 7PM-7AM, please contact night-coverage [email or web address]  09/19/2022, 8:10 AM

## 2022-09-19 NOTE — Telephone Encounter (Signed)
Pt daughter called stating Dr. Lajoyce Corners performed surgery on pt Friday 09/14/22, pt is being transferred from hospital today. Daughter is requesting a call from Dr.Duda at [phone]

## 2022-09-19 NOTE — Telephone Encounter (Signed)
I called and sw pt's daughter and she stated that she is doing much better and getting up and going to the BR with a 2 person assist with physical therapy. She wants to make sure that when the pt d/c to [facility] and she wants to make sure that the nurses will take her to the restroom.  She is s/p a left ankle ORIF 09/14/2022. Daughter would like an order for nursing to to offer pt to be assisted to the restroom several times a day. Per the daughter she is doing great and doing more things on her own and wants to "keep her moving" asking if you can incorporate this in her d/c orders. I advised I would take down this message and send to Dr.Duda to advise or request.

## 2022-09-19 NOTE — Plan of Care (Signed)

## 2022-09-20 DIAGNOSIS — I10 Essential (primary) hypertension: Secondary | ICD-10-CM | POA: Diagnosis not present

## 2022-09-20 DIAGNOSIS — S82852S Displaced trimalleolar fracture of left lower leg, sequela: Secondary | ICD-10-CM | POA: Diagnosis not present

## 2022-09-20 DIAGNOSIS — S82852D Displaced trimalleolar fracture of left lower leg, subsequent encounter for closed fracture with routine healing: Secondary | ICD-10-CM | POA: Diagnosis not present

## 2022-09-20 DIAGNOSIS — M069 Rheumatoid arthritis, unspecified: Secondary | ICD-10-CM | POA: Diagnosis not present

## 2022-09-20 DIAGNOSIS — E785 Hyperlipidemia, unspecified: Secondary | ICD-10-CM | POA: Diagnosis not present

## 2022-09-20 DIAGNOSIS — R404 Transient alteration of awareness: Secondary | ICD-10-CM | POA: Diagnosis not present

## 2022-09-20 DIAGNOSIS — R531 Weakness: Secondary | ICD-10-CM | POA: Diagnosis not present

## 2022-09-20 DIAGNOSIS — M6281 Muscle weakness (generalized): Secondary | ICD-10-CM | POA: Diagnosis not present

## 2022-09-20 DIAGNOSIS — M6259 Muscle wasting and atrophy, not elsewhere classified, multiple sites: Secondary | ICD-10-CM | POA: Diagnosis not present

## 2022-09-20 DIAGNOSIS — F0390 Unspecified dementia without behavioral disturbance: Secondary | ICD-10-CM | POA: Diagnosis not present

## 2022-09-20 DIAGNOSIS — Z7401 Bed confinement status: Secondary | ICD-10-CM | POA: Diagnosis not present

## 2022-09-20 LAB — BASIC METABOLIC PANEL
Anion gap: 8 (ref 5–15)
BUN: 13 mg/dL (ref 6–20)
CO2: 26 mmol/L (ref 22–32)
Calcium: 8.6 mg/dL — ABNORMAL LOW (ref 8.9–10.3)
Chloride: 103 mmol/L (ref 98–111)
Creatinine, Ser: 0.75 mg/dL (ref 0.44–1.00)
GFR, Estimated: >60 mL/min (ref >60)
Glucose, Bld: 98 mg/dL (ref 70–99)
Potassium: 4 mmol/L (ref 3.5–5.1)
Sodium: 137 mmol/L (ref 135–145)

## 2022-09-20 MED ORDER — HYDROCODONE-ACETAMINOPHEN 7.5-325 MG PO TABS: 1.0000 | ORAL_TABLET | ORAL | 0 refills | Status: AC | PRN

## 2022-09-20 MED ORDER — SENNOSIDES-DOCUSATE SODIUM 8.6-50 MG PO TABS: 1.0000 | ORAL_TABLET | Freq: Every evening | ORAL | 0 refills | Status: DC | PRN

## 2022-09-20 MED ORDER — POLYETHYLENE GLYCOL 3350 17 G PO PACK: 17.0000 g | PACK | Freq: Every day | ORAL | 0 refills | Status: DC | PRN

## 2022-09-20 MED ORDER — ACETAMINOPHEN 325 MG PO TABS: 325.0000 mg | ORAL_TABLET | Freq: Four times a day (QID) | ORAL | 0 refills | Status: DC | PRN

## 2022-09-20 NOTE — Discharge Summary (Signed)
Physician Discharge Summary  Donna Jacobs ZOX:[id number] DOB: 10/06/1960 DOA: 09/13/2022  PCP: Catha Gosselin, MD  Admit date: 09/13/2022 Discharge date: 09/20/2022  Admitted From: Home Disposition:  SNF  Recommendations for Outpatient Follow-up:  Follow up with PCP in 1-2 weeks Follow up with orthopedics as scheduled   Discharge Condition:Stable  CODE STATUS:Full  Diet recommendation:  Low salt low fat diet  Brief/Interim Summary: 62 y.o. female with a known history of dementia, hyperlipidemia, rheumatoid arthritis, history of substance abuse presents to the emergency department for evaluation of ankle pain status post fall.  She was found to have trimalleolar fracture of the left ankle, status post surgical repair 8/16. Patient tolerated procedure well, continues to have difficulty with ambulation - PT recommending ongoing PT at SNF postoperatively. Otherwise stable and agreeable for discharge.  Comorbid conditions stable - no medication changes.  Discharge Diagnoses:  Principal Problem:   Trimalleolar fracture of ankle, closed Active Problems:   Trimalleolar fracture of ankle, closed, left, sequela    Discharge Instructions   Allergies as of 09/20/2022       Reactions   Atorvastatin Other (See Comments)   Other Reaction(s): myalgia    Lipitor [atorvastatin Calcium]    Codeine Nausea And Vomiting        Medication List     STOP taking these medications    HYDROcodone-acetaminophen 5-325 MG tablet Commonly known as: NORCO/VICODIN Replaced by: HYDROcodone-acetaminophen 7.5-325 MG tablet       TAKE these medications    acetaminophen 325 MG tablet Commonly known as: TYLENOL Take 1-2 tablets (325-650 mg total) by mouth every 6 (six) hours as needed for mild pain (pain score 1-3 or temp > 100.5).   donepezil 10 MG tablet Commonly known as: ARICEPT TAKE 1 TABLET BY MOUTH AT BEDTIME   ezetimibe 10 MG tablet Commonly known as: ZETIA Take 10 mg by mouth  daily.   HYDROcodone-acetaminophen 7.5-325 MG tablet Commonly known as: NORCO Take 1-2 tablets by mouth every 4 (four) hours as needed for up to 3 days for severe pain (pain score 7-10). Replaces: HYDROcodone-acetaminophen 5-325 MG tablet   memantine 10 MG tablet Commonly known as: NAMENDA Take 1 tablet (10 mg total) by mouth 2 (two) times daily.   Multivitamin Women Tabs Take 1 tablet by mouth daily in the afternoon.   naproxen 375 MG tablet Commonly known as: NAPROSYN Take 1 tablet (375 mg total) by mouth 2 (two) times daily.   polyethylene glycol 17 g packet Commonly known as: MIRALAX / GLYCOLAX Take 17 g by mouth daily as needed for mild constipation.   rosuvastatin 10 MG tablet Commonly known as: CRESTOR Take 10 mg by mouth daily.   senna-docusate 8.6-50 MG tablet Commonly known as: Senokot-S Take 1 tablet by mouth at bedtime as needed for mild constipation.        Contact information for follow-up providers     Nadara Mustard, MD Follow up in 1 week(s).   Specialty: Orthopedic Surgery Contact information: [address] [place] [address] [phone]              Contact information for after-discharge care     Destination     [facility] Preferred SNF .   Service: Skilled Nursing Contact information: [address] [place] [address] [phone]                    Allergies  Allergen Reactions   Atorvastatin Other (See Comments)  Other Reaction(s): myalgia    Lipitor [Atorvastatin Calcium]    Codeine Nausea And Vomiting    Consultations: Ortho   Procedures/Studies: DG MINI C-ARM IMAGE ONLY  Result Date: 09/14/2022 There is no interpretation for this exam.  This order is for images obtained during a surgical procedure.  Please See "Surgeries" Tab for more information regarding the procedure.   Chest Portable 1 View  Result Date: 09/14/2022 CLINICAL DATA:  Preoperative  evaluation for ankle fracture. Altered mental status. EXAM: PORTABLE CHEST 1 VIEW COMPARISON:  Chest radiograph dated 08/09/2016 FINDINGS: Low lung volumes. Mild left basilar interstitial opacities. No pleural effusion or pneumothorax. The heart size and mediastinal contours are within normal limits. No acute osseous abnormality. Right upper quadrant surgical clips. IMPRESSION: Low lung volumes with mild left basilar interstitial opacities, which may represent mild pulmonary edema. Electronically Signed   By: Agustin Cree M.D.   On: 09/14/2022 08:25   CT Ankle Left Wo Contrast  Result Date: 09/13/2022 CLINICAL DATA:  Acute ankle fracture EXAM: CT OF THE LEFT ANKLE WITHOUT CONTRAST TECHNIQUE: Multidetector CT imaging of the left ankle was performed according to the standard protocol. Multiplanar CT image reconstructions were also generated. RADIATION DOSE REDUCTION: This exam was performed according to the departmental dose-optimization program which includes automated exposure control, adjustment of the mA and/or kV according to patient size and/or use of iterative reconstruction technique. COMPARISON:  Radiographs 09/13/2022 FINDINGS: Bones/Joint/Cartilage Mixed appearance with elements of stage 2Weber A and stage 4 Weber B fracture of the ankle is identified with a transverse lateral malleolar component and a primarily oblique/longitudinal medial malleolar component as shown on image 49 series 6. There is some additional comminution including a transverse medial malleolar tip component as well as an oblique posterior malleolar component. This fracture pattern is unstable. A 6 mm free cortical fragment sits in the joint anterior to the talar dome on image 32 series 7. Degenerative subcortical cyst formation noted between the navicular and the middle cuneiform dorsally. Ligaments Suboptimally assessed by CT. Muscles and Tendons Fat density potentially reflecting lipoma noted in the distal extensor digitorum longus  muscle on image 1 series 5. No flexor tendon entrapment.  Achilles tendon intact. Soft tissues Circumferential subcutaneous edema in the ankle and tracking into the dorsum of foot. IMPRESSION: 1. Mixed features of stage 2 Weber A and stage 4 Weber B fracture of the ankle as detailed above, with a 6 mm free cortical fragment in the joint anterior to the talar dome. The fracture pattern is unstable. 2. Circumferential subcutaneous edema in the ankle and tracking into the dorsum of foot. 3. Fat density potentially reflecting lipoma in the distal extensor digitorum longus muscle. Electronically Signed   By: Gaylyn Rong M.D.   On: 09/13/2022 20:54   DG Ankle Complete Left  Result Date: 09/13/2022 Please see detailed radiograph report in office note.  DG Ankle Complete Left  Result Date: 09/05/2022 CLINICAL DATA:  Pain after fall EXAM: LEFT ANKLE COMPLETE - 3 VIEW COMPARISON:  X-ray 08/25/2013 FINDINGS: Soft tissue swelling. There is a comminuted fracture of the distal tibia involving the base of the medial malleolus obliquely as well as along the metaphysis of the tibia. There is also a transverse fracture of the distal fibular metaphysis which is nondisplaced. Well corticated plantar calcaneal spur. IMPRESSION: Soft tissue swelling.  Fractures of the distal tibia and fibula. Electronically Signed   By: Karen Kays M.D.   On: 09/05/2022 14:51     Subjective: No acute  issues/events overnight- pain well controlled   Discharge Exam: Vitals:   09/20/22 0426 09/20/22 0859  BP: (!) 122/56 (!) 110/95  Pulse: 79 100  Resp:  20  Temp: 98.5 F (36.9 C) 97.6 F (36.4 C)  SpO2: 100% 98%   Vitals:   09/19/22 0925 09/19/22 1737 09/20/22 0426 09/20/22 0859  BP: 111/85 118/66 (!) 122/56 (!) 110/95  Pulse: 81 91 79 100  Resp: 16 15  20   Temp: 98.3 F (36.8 C) 98.2 F (36.8 C) 98.5 F (36.9 C) 97.6 F (36.4 C)  TempSrc: Oral Oral Oral Oral  SpO2: 98% 97% 100% 98%  Weight:        General: Pt  is alert, awake, not in acute distress Cardiovascular: RRR, S1/S2 +, no rubs, no gallops Respiratory: CTA bilaterally, no wheezing, no rhonchi Abdominal: Soft, NT, ND, bowel sounds + Extremities: L ankle bandage clean/dry/intact   The results of significant diagnostics from this hospitalization (including imaging, microbiology, ancillary and laboratory) are listed below for reference.     Microbiology: Recent Results (from the past 240 hour(s))  Surgical pcr screen     Status: Abnormal   Collection Time: 09/14/22  9:34 AM   Specimen: Nasal Mucosa; Nasal Swab  Result Value Ref Range Status   MRSA, PCR NEGATIVE NEGATIVE Final   Staphylococcus aureus POSITIVE (A) NEGATIVE Final    Comment: (NOTE) The Xpert SA Assay (FDA approved for NASAL specimens in patients 71 years of age and older), is one component of a comprehensive surveillance program. It is not intended to diagnose infection nor to guide or monitor treatment. Performed at [facility], [address] [place], [place] [address]      Labs: BNP (last 3 results) No results for input(s): "BNP" in the last 8760 hours. Basic Metabolic Panel: Recent Labs  Lab 09/15/22 0127 09/16/22 0449 09/17/22 0339 09/18/22 0122 09/19/22 0414  NA 135 138 138 134* 137  K 3.7 3.6 3.4* 4.3 3.9  CL 100 106 106 102 102  CO2 24 26 24 24 26   GLUCOSE 107* 107* 102* 108* 100*  BUN 12 15 12 16 11   CREATININE 0.59 0.67 0.61 0.64 0.86  CALCIUM 8.6* 8.0* 8.4* 8.5* 8.4*   Liver Function Tests: No results for input(s): "AST", "ALT", "ALKPHOS", "BILITOT", "PROT", "ALBUMIN" in the last 168 hours. No results for input(s): "LIPASE", "AMYLASE" in the last 168 hours. No results for input(s): "AMMONIA" in the last 168 hours. CBC: Recent Labs  Lab 09/13/22 2154 09/13/22 2159 09/15/22 0127 09/16/22 0449 09/17/22 0339 09/18/22 0122  WBC 8.5  --  10.0 8.3 9.7 10.9*  NEUTROABS 5.9  --   --   --   --   --   HGB 13.9 13.9 13.2 11.1* 11.6*  11.9*  HCT 41.3 41.0 39.6 33.3* 35.1* 35.5*  MCV 89.0  --  87.6 90.2 88.6 89.4  PLT 251  --  237 203 232 245   Cardiac Enzymes: No results for input(s): "CKTOTAL", "CKMB", "CKMBINDEX", "TROPONINI" in the last 168 hours. BNP: Invalid input(s): "POCBNP" CBG: No results for input(s): "GLUCAP" in the last 168 hours. D-Dimer No results for input(s): "DDIMER" in the last 72 hours. Hgb A1c No results for input(s): "HGBA1C" in the last 72 hours. Lipid Profile No results for input(s): "CHOL", "HDL", "LDLCALC", "TRIG", "CHOLHDL", "LDLDIRECT" in the last 72 hours. Thyroid function studies No results for input(s): "TSH", "T4TOTAL", "T3FREE", "THYROIDAB" in the last 72 hours.  Invalid input(s): "FREET3" Anemia work up No  results for input(s): "VITAMINB12", "FOLATE", "FERRITIN", "TIBC", "IRON", "RETICCTPCT" in the last 72 hours. Urinalysis No results found for: "COLORURINE", "APPEARANCEUR", "LABSPEC", "PHURINE", "GLUCOSEU", "HGBUR", "BILIRUBINUR", "KETONESUR", "PROTEINUR", "UROBILINOGEN", "NITRITE", "LEUKOCYTESUR" Sepsis Labs Recent Labs  Lab 09/15/22 0127 09/16/22 0449 09/17/22 0339 09/18/22 0122  WBC 10.0 8.3 9.7 10.9*   Microbiology Recent Results (from the past 240 hour(s))  Surgical pcr screen     Status: Abnormal   Collection Time: 09/14/22  9:34 AM   Specimen: Nasal Mucosa; Nasal Swab  Result Value Ref Range Status   MRSA, PCR NEGATIVE NEGATIVE Final   Staphylococcus aureus POSITIVE (A) NEGATIVE Final    Comment: (NOTE) The Xpert SA Assay (FDA approved for NASAL specimens in patients 74 years of age and older), is one component of a comprehensive surveillance program. It is not intended to diagnose infection nor to guide or monitor treatment. Performed at [facility], [address] [place], [place] [address]      Time coordinating discharge: Over 30 minutes  SIGNED:   Azucena Fallen, DO [facility] 09/20/2022, 11:10 AM Pager   If 7PM-7AM,  please contact night-coverage [email or web address]

## 2022-09-20 NOTE — Progress Notes (Signed)
Mobility Specialist: Progress Note   09/20/22 1018  Mobility  Activity Transferred from bed to chair  Level of Assistance Moderate assist, patient does 50-74% (+2)  Assistive Device None  LLE Weight Bearing WBAT  Activity Response Tolerated well  Mobility Referral Yes  $Mobility charge 1 Mobility  Mobility Specialist Start Time (ACUTE ONLY) 1008  Mobility Specialist Stop Time (ACUTE ONLY) 1016  Mobility Specialist Time Calculation (min) (ACUTE ONLY) 8 min    Pt was agreeable to mobility session - received in bed. Required max verbal and tactile cues for lateral scoot. MinA for bed mobility, modA +2 for lateral scoot transfer to drop-arm recliner. No c/o throughout. Left in recliner with all needs met, call bell in reach. Chair alarm on.   Maurene Capes Mobility Specialist Please contact via SecureChat or Rehab office at [phone]

## 2022-09-20 NOTE — TOC Transition Note (Signed)
Transition of Care [facility]) - CM/SW Discharge Note   Patient Details  Name: RIELLE FLORY MRN: [id number] Date of Birth: 03/17/1960  [facility]) CM/SW Contact:  Lorri Frederick, LCSW Phone Number: 09/20/2022, 11:32 AM   Clinical Narrative:   Pt discharging to [facility].  RN call report to [phone].     Final next level of care: Skilled Nursing Facility Barriers to Discharge: Barriers Resolved   Patient Goals and CMS Choice      Discharge Placement                Patient chooses bed at: [facility] Patient to be transferred to facility by: [facility] Name of family member notified: daughter Winter Patient and family notified of of transfer: 09/20/22  Discharge Plan and Services Additional resources added to the After Visit Summary for   In-house Referral: Clinical Social Work   Post Acute Care Choice: Skilled Nursing Facility                               Social Determinants of Health (SDOH) Interventions SDOH Screenings   Tobacco Use: Medium Risk (09/14/2022)     Readmission Risk Interventions     No data to display

## 2022-09-20 NOTE — TOC Progression Note (Signed)
Transition of Care [facility]) - Progression Note    Patient Details  Name: Donna Jacobs MRN: [id number] Date of Birth: [birth year]  Transition [facility] Care [facility]) CM/SW Contact  Lorri Frederick, LCSW Phone Number: 09/20/2022, 8:37 AM  Clinical Narrative:   SNF auth approved: [id number], 3 days: 8/21-8/23.  MD informed.     Expected Discharge Plan: Skilled Nursing Facility Barriers to Discharge: Continued Medical Work up, SNF Pending bed offer  Expected Discharge Plan and Services In-house Referral: Clinical Social Work   Post Acute Care Choice: Skilled Nursing Facility Living arrangements for the past 2 months: Single Family Home                                       Social Determinants of Health (SDOH) Interventions SDOH Screenings   Tobacco Use: Medium Risk (09/14/2022)    Readmission Risk Interventions     No data to display

## 2022-09-20 NOTE — Discharge Planning (Signed)
Patient alert and oriented to self. Husband at bedside. IV access removed. Discharge teaching given to Velna Hatchet, RN at [facility] and husband. Report called to Velna Hatchet, RN at [facility]. Patient transported to [facility] via [facility].

## 2022-09-21 ENCOUNTER — Telehealth: Payer: Self-pay | Admitting: Orthopedic Surgery

## 2022-09-21 DIAGNOSIS — I10 Essential (primary) hypertension: Secondary | ICD-10-CM | POA: Diagnosis not present

## 2022-09-21 DIAGNOSIS — F0390 Unspecified dementia without behavioral disturbance: Secondary | ICD-10-CM | POA: Diagnosis not present

## 2022-09-21 DIAGNOSIS — M069 Rheumatoid arthritis, unspecified: Secondary | ICD-10-CM | POA: Diagnosis not present

## 2022-09-21 NOTE — Telephone Encounter (Signed)
Windell Moulding from [facility] called. Says patient was brought there with no protocols. Would like verbal orders. Her cb# is [phone]

## 2022-09-21 NOTE — Telephone Encounter (Signed)
LMTCB

## 2022-09-24 ENCOUNTER — Other Ambulatory Visit: Payer: Self-pay | Admitting: Orthopedic Surgery

## 2022-09-24 ENCOUNTER — Telehealth: Payer: Self-pay | Admitting: Family

## 2022-09-24 DIAGNOSIS — Z9889 Other specified postprocedural states: Secondary | ICD-10-CM

## 2022-09-24 NOTE — Telephone Encounter (Signed)
Winter informed.

## 2022-09-24 NOTE — Telephone Encounter (Signed)
Pt is no longer at [facility]. I am closing out this  message.

## 2022-09-24 NOTE — Telephone Encounter (Signed)
Pt's daughter Liliane Shi called needing orders for physical therapy for inpatient. Winter states she is very unpleased with nursing home she was in. Winter states she took patient home and need orders for inpatient pt ASAP. Please call Winter at [phone].

## 2022-09-24 NOTE — Telephone Encounter (Signed)
Put in [facility] referral to [facility], per Ralph Dowdy.

## 2022-09-25 ENCOUNTER — Telehealth: Payer: Self-pay | Admitting: Orthopedic Surgery

## 2022-09-25 DIAGNOSIS — S82852D Displaced trimalleolar fracture of left lower leg, subsequent encounter for closed fracture with routine healing: Secondary | ICD-10-CM | POA: Diagnosis not present

## 2022-09-25 DIAGNOSIS — F0390 Unspecified dementia without behavioral disturbance: Secondary | ICD-10-CM | POA: Diagnosis not present

## 2022-09-25 DIAGNOSIS — Z9181 History of falling: Secondary | ICD-10-CM | POA: Diagnosis not present

## 2022-09-25 DIAGNOSIS — M069 Rheumatoid arthritis, unspecified: Secondary | ICD-10-CM | POA: Diagnosis not present

## 2022-09-25 DIAGNOSIS — F1910 Other psychoactive substance abuse, uncomplicated: Secondary | ICD-10-CM | POA: Diagnosis not present

## 2022-09-25 DIAGNOSIS — Z791 Long term (current) use of non-steroidal anti-inflammatories (NSAID): Secondary | ICD-10-CM | POA: Diagnosis not present

## 2022-09-25 DIAGNOSIS — Z556 Problems related to health literacy: Secondary | ICD-10-CM | POA: Diagnosis not present

## 2022-09-25 DIAGNOSIS — M419 Scoliosis, unspecified: Secondary | ICD-10-CM | POA: Diagnosis not present

## 2022-09-25 DIAGNOSIS — E785 Hyperlipidemia, unspecified: Secondary | ICD-10-CM | POA: Diagnosis not present

## 2022-09-25 DIAGNOSIS — Z993 Dependence on wheelchair: Secondary | ICD-10-CM | POA: Diagnosis not present

## 2022-09-25 NOTE — Telephone Encounter (Signed)
SW Verlon Au and advised her that right now she is still assisted transfers from bed to chair. We haven't seen her yet for her post op apt. Once we see her, we can do a more definite WTB status for them.  I called dtr,Winter, and pt is scheduled 10/02/22 for post op.

## 2022-09-25 NOTE — Telephone Encounter (Signed)
Arminda Resides from home health called and a patient wanted to know if she could walk or weight barrier. Someone told her this Friday to start trying to walk on her foot. CB#[phone]

## 2022-09-27 DIAGNOSIS — Z993 Dependence on wheelchair: Secondary | ICD-10-CM | POA: Diagnosis not present

## 2022-09-27 DIAGNOSIS — M419 Scoliosis, unspecified: Secondary | ICD-10-CM | POA: Diagnosis not present

## 2022-09-27 DIAGNOSIS — M069 Rheumatoid arthritis, unspecified: Secondary | ICD-10-CM | POA: Diagnosis not present

## 2022-09-27 DIAGNOSIS — S82852D Displaced trimalleolar fracture of left lower leg, subsequent encounter for closed fracture with routine healing: Secondary | ICD-10-CM | POA: Diagnosis not present

## 2022-09-27 DIAGNOSIS — Z556 Problems related to health literacy: Secondary | ICD-10-CM | POA: Diagnosis not present

## 2022-09-27 DIAGNOSIS — F0390 Unspecified dementia without behavioral disturbance: Secondary | ICD-10-CM | POA: Diagnosis not present

## 2022-09-27 DIAGNOSIS — Z9181 History of falling: Secondary | ICD-10-CM | POA: Diagnosis not present

## 2022-09-27 DIAGNOSIS — E785 Hyperlipidemia, unspecified: Secondary | ICD-10-CM | POA: Diagnosis not present

## 2022-09-27 DIAGNOSIS — Z791 Long term (current) use of non-steroidal anti-inflammatories (NSAID): Secondary | ICD-10-CM | POA: Diagnosis not present

## 2022-09-27 DIAGNOSIS — F1910 Other psychoactive substance abuse, uncomplicated: Secondary | ICD-10-CM | POA: Diagnosis not present

## 2022-10-01 DIAGNOSIS — Z556 Problems related to health literacy: Secondary | ICD-10-CM | POA: Diagnosis not present

## 2022-10-01 DIAGNOSIS — Z9181 History of falling: Secondary | ICD-10-CM | POA: Diagnosis not present

## 2022-10-01 DIAGNOSIS — M069 Rheumatoid arthritis, unspecified: Secondary | ICD-10-CM | POA: Diagnosis not present

## 2022-10-01 DIAGNOSIS — Z993 Dependence on wheelchair: Secondary | ICD-10-CM | POA: Diagnosis not present

## 2022-10-01 DIAGNOSIS — M419 Scoliosis, unspecified: Secondary | ICD-10-CM | POA: Diagnosis not present

## 2022-10-01 DIAGNOSIS — E785 Hyperlipidemia, unspecified: Secondary | ICD-10-CM | POA: Diagnosis not present

## 2022-10-01 DIAGNOSIS — F0390 Unspecified dementia without behavioral disturbance: Secondary | ICD-10-CM | POA: Diagnosis not present

## 2022-10-01 DIAGNOSIS — Z791 Long term (current) use of non-steroidal anti-inflammatories (NSAID): Secondary | ICD-10-CM | POA: Diagnosis not present

## 2022-10-01 DIAGNOSIS — S82852D Displaced trimalleolar fracture of left lower leg, subsequent encounter for closed fracture with routine healing: Secondary | ICD-10-CM | POA: Diagnosis not present

## 2022-10-01 DIAGNOSIS — F1910 Other psychoactive substance abuse, uncomplicated: Secondary | ICD-10-CM | POA: Diagnosis not present

## 2022-10-02 ENCOUNTER — Other Ambulatory Visit (INDEPENDENT_AMBULATORY_CARE_PROVIDER_SITE_OTHER): Payer: Medicare Other

## 2022-10-02 ENCOUNTER — Ambulatory Visit (INDEPENDENT_AMBULATORY_CARE_PROVIDER_SITE_OTHER): Payer: Medicare Other | Admitting: Orthopedic Surgery

## 2022-10-02 ENCOUNTER — Encounter: Payer: Self-pay | Admitting: Orthopedic Surgery

## 2022-10-02 DIAGNOSIS — Z9889 Other specified postprocedural states: Secondary | ICD-10-CM

## 2022-10-02 DIAGNOSIS — Z8781 Personal history of (healed) traumatic fracture: Secondary | ICD-10-CM

## 2022-10-02 NOTE — Progress Notes (Signed)
   Post-Op Visit Note   Patient: Donna Jacobs           Date of Birth: Nov 07, 1960           MRN: [id number] Visit Date: 10/02/2022 PCP: Jarrett Soho, PA-C  Chief Complaint:  Chief Complaint  Patient presents with   Left Ankle - Routine Post Op    09/14/2022 ORIF left ankle fx     HPI:  HPI The patient is a 62 year old woman who presents status post open reduction internal fixation of a left ankle fracture Ortho Exam On examination left ankle the incisions are well-healed sutures are in place there is no gaping drainage or erythema sutures harvested today without incident  Visit Diagnoses:  1. S/P ORIF (open reduction internal fixation) fracture     Plan: May weight-bear in the cam boot with physical therapy.  We will update orders with PT.  Family request 3 times weekly visits with PT.  May work on range of motion out of the boot  Will follow-up in the office with radiographs at next visit.  Follow-Up Instructions: No follow-ups on file.   Imaging: No results found.  Orders:  Orders Placed This Encounter  Procedures   XR Ankle Complete Left   XR Toe Great Left   No orders of the defined types were placed in this encounter.    PMFS History: Patient Active Problem List   Diagnosis Date Noted   Trimalleolar fracture of ankle, closed, left, sequela 09/13/2022   Trimalleolar fracture of ankle, closed 09/13/2022   Major neurocognitive disorder, possible Alzheimer's disease 05/28/2019   Menopausal flushing    Menopausal syndrome    Vitamin D deficiency    Hyperlipidemia    Rheumatoid arthritis    History of substance abuse    S/P lumbar spinal fusion 08/15/2016   Past Medical History:  Diagnosis Date   History of substance abuse    Briefly spent time in substance abuse rehabilitation treatment as a teenager. Cocaine likely represented the primary drug of abuse.    Hyperlipidemia    "I used to take med. for cholesteol, but only for a short time"   Major  neurocognitive disorder 05/28/2019   Menopausal syndrome    Rheumatoid arthritis    Lumbar - spondylisthesis   S/P lumbar spinal fusion 08/15/2016   Vitamin D deficiency     Family History  Problem Relation Age of Onset   Alzheimer's disease Paternal Grandmother     Past Surgical History:  Procedure Laterality Date   ABDOMINAL HYSTERECTOMY     ORIF ANKLE FRACTURE Left 09/14/2022   Procedure: OPEN REDUCTION INTERNAL FIXATION (ORIF) ANKLE FRACTURE;  Surgeon: Nadara Mustard, MD;  Location: [facility] OR;  Service: Orthopedics;  Laterality: Left;   TUBAL LIGATION     VAGINAL DELIVERY     x2   Social History   Occupational History   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[employer]  Tobacco Use   Smoking status: Former    Current packs/day: 0.00    Types: Cigarettes    Quit date: 08/10/1975    Years since quitting: 47.1   Smokeless tobacco: Never  Substance and Sexual Activity   Alcohol use: Yes    Comment: very rarely   Drug use: Not Currently    Types: Cocaine, Marijuana   Sexual activity: Not on file

## 2022-10-03 ENCOUNTER — Encounter: Payer: Self-pay | Admitting: Family Medicine

## 2022-10-03 DIAGNOSIS — Z993 Dependence on wheelchair: Secondary | ICD-10-CM | POA: Diagnosis not present

## 2022-10-03 DIAGNOSIS — Z791 Long term (current) use of non-steroidal anti-inflammatories (NSAID): Secondary | ICD-10-CM | POA: Diagnosis not present

## 2022-10-03 DIAGNOSIS — S82852D Displaced trimalleolar fracture of left lower leg, subsequent encounter for closed fracture with routine healing: Secondary | ICD-10-CM | POA: Diagnosis not present

## 2022-10-03 DIAGNOSIS — M419 Scoliosis, unspecified: Secondary | ICD-10-CM | POA: Diagnosis not present

## 2022-10-03 DIAGNOSIS — Z556 Problems related to health literacy: Secondary | ICD-10-CM | POA: Diagnosis not present

## 2022-10-03 DIAGNOSIS — E785 Hyperlipidemia, unspecified: Secondary | ICD-10-CM | POA: Diagnosis not present

## 2022-10-03 DIAGNOSIS — M069 Rheumatoid arthritis, unspecified: Secondary | ICD-10-CM | POA: Diagnosis not present

## 2022-10-03 DIAGNOSIS — F0390 Unspecified dementia without behavioral disturbance: Secondary | ICD-10-CM | POA: Diagnosis not present

## 2022-10-03 DIAGNOSIS — Z9181 History of falling: Secondary | ICD-10-CM | POA: Diagnosis not present

## 2022-10-03 DIAGNOSIS — F1910 Other psychoactive substance abuse, uncomplicated: Secondary | ICD-10-CM | POA: Diagnosis not present

## 2022-10-05 DIAGNOSIS — F0390 Unspecified dementia without behavioral disturbance: Secondary | ICD-10-CM | POA: Diagnosis not present

## 2022-10-05 DIAGNOSIS — Z9181 History of falling: Secondary | ICD-10-CM | POA: Diagnosis not present

## 2022-10-05 DIAGNOSIS — E785 Hyperlipidemia, unspecified: Secondary | ICD-10-CM | POA: Diagnosis not present

## 2022-10-05 DIAGNOSIS — F1910 Other psychoactive substance abuse, uncomplicated: Secondary | ICD-10-CM | POA: Diagnosis not present

## 2022-10-05 DIAGNOSIS — Z993 Dependence on wheelchair: Secondary | ICD-10-CM | POA: Diagnosis not present

## 2022-10-05 DIAGNOSIS — Z556 Problems related to health literacy: Secondary | ICD-10-CM | POA: Diagnosis not present

## 2022-10-05 DIAGNOSIS — M419 Scoliosis, unspecified: Secondary | ICD-10-CM | POA: Diagnosis not present

## 2022-10-05 DIAGNOSIS — Z791 Long term (current) use of non-steroidal anti-inflammatories (NSAID): Secondary | ICD-10-CM | POA: Diagnosis not present

## 2022-10-05 DIAGNOSIS — S82852D Displaced trimalleolar fracture of left lower leg, subsequent encounter for closed fracture with routine healing: Secondary | ICD-10-CM | POA: Diagnosis not present

## 2022-10-05 DIAGNOSIS — M069 Rheumatoid arthritis, unspecified: Secondary | ICD-10-CM | POA: Diagnosis not present

## 2022-10-08 DIAGNOSIS — E785 Hyperlipidemia, unspecified: Secondary | ICD-10-CM | POA: Diagnosis not present

## 2022-10-08 DIAGNOSIS — M419 Scoliosis, unspecified: Secondary | ICD-10-CM | POA: Diagnosis not present

## 2022-10-08 DIAGNOSIS — M069 Rheumatoid arthritis, unspecified: Secondary | ICD-10-CM | POA: Diagnosis not present

## 2022-10-08 DIAGNOSIS — Z9181 History of falling: Secondary | ICD-10-CM | POA: Diagnosis not present

## 2022-10-08 DIAGNOSIS — F0390 Unspecified dementia without behavioral disturbance: Secondary | ICD-10-CM | POA: Diagnosis not present

## 2022-10-08 DIAGNOSIS — S82852D Displaced trimalleolar fracture of left lower leg, subsequent encounter for closed fracture with routine healing: Secondary | ICD-10-CM | POA: Diagnosis not present

## 2022-10-08 DIAGNOSIS — Z556 Problems related to health literacy: Secondary | ICD-10-CM | POA: Diagnosis not present

## 2022-10-08 DIAGNOSIS — Z993 Dependence on wheelchair: Secondary | ICD-10-CM | POA: Diagnosis not present

## 2022-10-08 DIAGNOSIS — F1910 Other psychoactive substance abuse, uncomplicated: Secondary | ICD-10-CM | POA: Diagnosis not present

## 2022-10-08 DIAGNOSIS — Z791 Long term (current) use of non-steroidal anti-inflammatories (NSAID): Secondary | ICD-10-CM | POA: Diagnosis not present

## 2022-10-10 DIAGNOSIS — M069 Rheumatoid arthritis, unspecified: Secondary | ICD-10-CM | POA: Diagnosis not present

## 2022-10-10 DIAGNOSIS — Z556 Problems related to health literacy: Secondary | ICD-10-CM | POA: Diagnosis not present

## 2022-10-10 DIAGNOSIS — E785 Hyperlipidemia, unspecified: Secondary | ICD-10-CM | POA: Diagnosis not present

## 2022-10-10 DIAGNOSIS — M419 Scoliosis, unspecified: Secondary | ICD-10-CM | POA: Diagnosis not present

## 2022-10-10 DIAGNOSIS — Z993 Dependence on wheelchair: Secondary | ICD-10-CM | POA: Diagnosis not present

## 2022-10-10 DIAGNOSIS — S82852D Displaced trimalleolar fracture of left lower leg, subsequent encounter for closed fracture with routine healing: Secondary | ICD-10-CM | POA: Diagnosis not present

## 2022-10-10 DIAGNOSIS — F1910 Other psychoactive substance abuse, uncomplicated: Secondary | ICD-10-CM | POA: Diagnosis not present

## 2022-10-10 DIAGNOSIS — Z791 Long term (current) use of non-steroidal anti-inflammatories (NSAID): Secondary | ICD-10-CM | POA: Diagnosis not present

## 2022-10-10 DIAGNOSIS — Z9181 History of falling: Secondary | ICD-10-CM | POA: Diagnosis not present

## 2022-10-10 DIAGNOSIS — F0390 Unspecified dementia without behavioral disturbance: Secondary | ICD-10-CM | POA: Diagnosis not present

## 2022-10-15 ENCOUNTER — Telehealth: Payer: Self-pay | Admitting: Family Medicine

## 2022-10-15 NOTE — Telephone Encounter (Signed)
Pt's daughter,Winter Ortego called to change pt appt on 9/23 to MyChart Video Visit.

## 2022-10-16 NOTE — Telephone Encounter (Signed)
Pt has been Changed to Mychart visit for 10/22/2022

## 2022-10-16 NOTE — Progress Notes (Deleted)
No chief complaint on file.   HISTORY OF PRESENT ILLNESS:  10/16/22 ALL:  10/25/2021 ALL: Donna Jacobs returns for follow up for early onset dementia. She continues donepezil 10mg  daily and memantine 10mg  BID. She has tolerated increased dose of memantine. Memory seems stable. No significant changes. She continues to be fairly independent with ADLs. Appetite is good. Family has been trying to reduce carbs and fatty foods due to elevated cholesterol levels. She has lost about 20lbs. She is sleeping well. Mood is good. She does get very anxious with doctor visits. BP is usually high when she comes to see [place] but seems to come down. Family has not checked at home. She lives with Donna Jacobs, husband. There is some concern of caregiver burnout. No safety concerns. Her daughter reports that Donna Jacobs is with her the majority of the time and doesn't get much free time. He has not reached out for help. Her daughter brings her to her home every Sunday for a few hours. She seems to really enjoy this. She goes to church with her best friend every Sunday.   12/26/2020 ALL: Donna Jacobs returns for follow up for early onset AD. She continues donepezil 10mg  daily and memantine 5mg  BID. She is doing fairly well. No significant changes. She does have more difficulty getting into her son's truck. She doesn't remember what foot to put on the step first. She continues to be mostly independent with ADLs. She does not drink much water, loves Donna Jacobs. Appetite is good. She is less active. Mood is good. She lives with her husband. She does not drive. BP is normally good. They have not checked at home but reports normal readings at PCP visits. She is asymptomatic today.   06/14/2020 ALL: Donna Jacobs returns for follow up for early onset AD. She continues Aricept 10mg  daily. Namenda 5mg  BID was added at last follow up in 12/2019. Mood has been much better. She is less irritable. She continues to live with her husband, Donna Jacobs. She requires some  assistance with ADL's. Medications are given by her family. She is eating and sleeping well. No changes in gait. No falls. She is not as active as she used to be. She loves to sit and watch TV. She likes to drink Donna Jacobs.   01/26/2020 ALL:  Donna Jacobs is a 62 y.o. female here today for follow up for early onset AD. Overall, she seems to be about the same since last being seen. Her daughter in law presents with her today and aids in history. She reports that around September, she noticed that she seemed more irritable and depressed. No previous history of depression. No concerns of SI/HI. She was a very active person when healthy. Now she is not as active and gets very frustrated when she can't go out, shop or visit with friends. She does not seem to understand why she is not able to go out.  She lives with her husband. She does not drive. Donna Jacobs gives medications and prepares all foods. Donna Jacobs is able to perform most other ADL's independently.   HISTORY (copied from Donna Jacobs previous note)  Donna Jacobs is a 62 year old right-handed woman with an underlying medical history of vitamin D deficiency, hyperlipidemia, arthritis, former smoking and obesity, who presents for follow-up consultation of her memory loss of 3+ years duration.  The patient is accompanied by her daughter-in-law, Donna Jacobs, today.  I last saw her on 06/18/2019, at which time we talked about her recent test results  including her MRI and neuropsychological evaluation.  She was agreeable to pursuing a PET scan.  She was advised to start Aricept 10 mg strength half a pill with increase to 1 pill after about a month.  She had an interim brain metabolic PET scan on 07/14/2019 and I reviewed the results: IMPRESSION: 1. Symmetric decreased radiotracer activity within bilateral parietal lobes compatible with a pattern seen with Alzheimer's dementia.   Her mother was contacted with her test results.   Today, 09/21/2019: She reports doing  fairly well.  Sometimes she feels her donepezil was making her tired and her eyelids become heavy.  She has a routine eye examination pending for later this year in November.  She has not fallen asleep inadvertently during the day.  In the first 2 weeks after starting the full dose of donepezil she was notably irritable per daughter-in-law.  She had some anger type reactions that were not like her.  It did help to take a break and go to the beach with the family.  She did well with that.  She has had some intermittent diarrhea but has a history of having diarrhea in the past, she saw GI in the past, has not seen a GI specialist as of late.  All in all, she is able to tolerate the donepezil and she has not had any mood irritability lately.  She is interested in watching TV.  She does not do a whole lot in terms of other activities.  She has not been able to exercise on a regular basis, walking outside has been possible intermittently weather permitting.  She has increased her water intake and decreased her soda intake.  She has not fallen recently.  She has seen rheumatology and had significant amount of blood work per daughter-in-law and was told to start vitamin D.  She has been using a gummy type multivitamin, no separate vitamin D.  They are not sure how much vitamin D is in it.     The patient's allergies, current medications, family history, past medical history, past social history, past surgical history and problem list were reviewed and updated as appropriate.    Previously:    I first met her on 12/16/2018 at the request of her primary care, at which time the patient reported a history of memory loss, she had been accompanied by her friend at the time.  She had recently had blood work through her primary care and I suggested further work-up in the form of brain MRI and also seeking consultation with neuropsychology.   She had a brain MRI without contrast on 12/30/2018 and I reviewed the results: :     MRI brain (without) demonstrating: - Mild periventricular and subcortical foci of T2 hyperintensities. These findings are non-specific and considerations include autoimmune, inflammatory, post-infectious, microvascular ischemic or migraine associated etiologies.  - No acute findings.   She had consultation, evaluation and feedback appointment with Donna. Milbert Coulter, neuropsychologist at [facility] and I reviewed his impression and recommendations.    His evaluation was supportive of early onset Alzheimer's disease.  Further evaluation was recommended in the form of PET scan or CSF evaluation.  Medication recommendations included anticholinergic dementia medication or Namenda.   12/16/18: (She) reports memory loss for the past 3+ years.  The patient has had forgetfulness and often misplaces things.  She has been driving.  Her friend has been able to observe her driving and feels that she has done well with local and familiar routes.  However, patient does report that she has gotten lost driving at times.  She continues to work.  She works for [employer], [employer], and has worked for the same company for over 30 years. She has had some mood issues including frustration, her husband has also been frustrated with her as I understand.  She lives with her husband, she has 2 grown sons.  She has 3 grandsons.  She quit smoking over 20 years ago.  She has a remote history of drug abuse and was in rehab when she was a teenager or young adults.  She admits to trying cocaine but denies any IV drug use, she denies any recent drug use or relapse, she has smoked marijuana as well.  She does not hydrate very well with water she admits.  She likes to drink Diet Coke.  She is not sure how many cans she drinks per day.  She does not currently drink any alcohol.  She denies any heavy alcohol use or alcohol abuse in the past.  She does not snore.  Neysa Bonito has not noticed any gasping sounds or snoring sounds when they have shared  a room before on a trip. She reports that her paternal grandmother had Alzheimer's disease.  Paternal aunt had no dementia.  The patient is the oldest of 3, she has a brother and a sister, neither 1 have memory loss.  I reviewed your office note from 10/27/2018.  She was referred to rheumatology at the time for a presumed diagnosis of rheumatoid arthritis.  She was also referred to orthopedics for leg length discrepancy.  She had blood work at the time including CBC with differential, B12, TSH, vitamin D.  Her B12 level was 257, vitamin D low at 21.9, TSH was normal, CBC with differential was unremarkable.  She is followed by rheumatology for her arthritis and prior diagnosis of rheumatoid arthritis.   REVIEW OF SYSTEMS: Out of a complete 14 system review of symptoms, the patient complains only of the following symptoms, memory loss, and all other reviewed systems are negative.   ALLERGIES: Allergies  Allergen Reactions   Atorvastatin Other (See Comments)    Other Reaction(s): myalgia    Lipitor [Atorvastatin Calcium]    Codeine Nausea And Vomiting     HOME MEDICATIONS: Outpatient Medications Prior to Visit  Medication Sig Dispense Refill   acetaminophen (TYLENOL) 325 MG tablet Take 1-2 tablets (325-650 mg total) by mouth every 6 (six) hours as needed for mild pain (pain score 1-3 or temp > 100.5). 30 tablet 0   donepezil (ARICEPT) 10 MG tablet TAKE 1 TABLET BY MOUTH AT BEDTIME 90 tablet 3   ezetimibe (ZETIA) 10 MG tablet Take 10 mg by mouth daily. (Patient not taking: Reported on 09/15/2022)     memantine (NAMENDA) 10 MG tablet Take 1 tablet (10 mg total) by mouth 2 (two) times daily. 180 tablet 3   Multiple Vitamins-Minerals (MULTIVITAMIN WOMEN) TABS Take 1 tablet by mouth daily in the afternoon.     naproxen (NAPROSYN) 375 MG tablet Take 1 tablet (375 mg total) by mouth 2 (two) times daily. 20 tablet 0   polyethylene glycol (MIRALAX / GLYCOLAX) 17 g packet Take 17 g by mouth daily as  needed for mild constipation. 14 each 0   rosuvastatin (CRESTOR) 10 MG tablet Take 10 mg by mouth daily.     senna-docusate (SENOKOT-S) 8.6-50 MG tablet Take 1 tablet by mouth at bedtime as needed for mild constipation. 30 tablet 0  No facility-administered medications prior to visit.     PAST MEDICAL HISTORY: Past Medical History:  Diagnosis Date   History of substance abuse    Briefly spent time in substance abuse rehabilitation treatment as a teenager. Cocaine likely represented the primary drug of abuse.    Hyperlipidemia    "I used to take med. for cholesteol, but only for a short time"   Major neurocognitive disorder 05/28/2019   Menopausal syndrome    Rheumatoid arthritis    Lumbar - spondylisthesis   S/P lumbar spinal fusion 08/15/2016   Vitamin D deficiency      PAST SURGICAL HISTORY: Past Surgical History:  Procedure Laterality Date   ABDOMINAL HYSTERECTOMY     ORIF ANKLE FRACTURE Left 09/14/2022   Procedure: OPEN REDUCTION INTERNAL FIXATION (ORIF) ANKLE FRACTURE;  Surgeon: Nadara Mustard, MD;  Location: [facility] OR;  Service: Orthopedics;  Laterality: Left;   TUBAL LIGATION     VAGINAL DELIVERY     x2     FAMILY HISTORY: Family History  Problem Relation Age of Onset   Alzheimer's disease Paternal Grandmother      SOCIAL HISTORY: Social History   Socioeconomic History   Marital status: Married    Spouse name: Not on file   Number of children: Not on file   Years of education: 12   Highest education level: High school graduate  Occupational History   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  Other Topics Concern    Not on file  Social History Narrative   Not on file   Social Determinants of Health   Financial Resource Strain: Not on file  Food Insecurity: Not on file  Transportation Needs: Not on file  Physical Activity: Not on file  Stress: Not on file  Social Connections: Not on file  Intimate Partner Violence: Not on file      PHYSICAL EXAM  There were no vitals filed for this visit.   There is no height or weight on file to calculate BMI.   Generalized: Well developed, in no acute distress  Cardiology: normal rate and rhythm, no murmur auscultated  Respiratory: clear to auscultation bilaterally    Neurological examination  Mentation: Alert, she is not oriented to time, but is oriented to place and some history taking. Follows all commands with repeated instruction, speech and language fluent Cranial nerve II-XII: Pupils were equal round reactive to light. Extraocular movements were full, visual field were full on confrontational test. Facial sensation and strength were normal. Uvula tongue midline. Head turning and shoulder shrug  were normal and symmetric. Motor: The motor testing reveals 5 over 5 strength of all 4 extremities. Good symmetric motor tone is noted throughout.  Sensory: Sensory testing is intact to soft touch on all 4 extremities. No evidence of extinction is noted.  Coordination: Cerebellar testing reveals good finger-nose-finger and heel-to-shin bilaterally.  Gait and station: Gait is arthritic    DIAGNOSTIC DATA (LABS, IMAGING, TESTING) - I reviewed patient records, labs, notes, testing and imaging myself where available.  Lab Results  Component Value Date   WBC 10.9 (H) 09/18/2022   HGB 11.9 (L) 09/18/2022   HCT 35.5 (L) 09/18/2022   MCV 89.4 09/18/2022   PLT 245 09/18/2022      Component Value Date/Time   NA 137 09/20/2022 1047   K 4.0 09/20/2022 1047   CL 103 09/20/2022 1047   CO2 26 09/20/2022 1047   GLUCOSE 98 09/20/2022 1047   BUN 13  09/20/2022 1047   CREATININE 0.75 09/20/2022 1047   CALCIUM 8.6 (L) 09/20/2022 1047   GFRNONAA >60 09/20/2022 1047   GFRAA >60 08/09/2016 1526   No results found for: "CHOL", "HDL", "LDLCALC", "LDLDIRECT", "TRIG", "CHOLHDL" No results found for: "HGBA1C" No results found for: "VITAMINB12" No results found for: "TSH"     10/25/2021    1:53 PM 12/26/2020    9:00 AM 06/14/2020    9:53 AM  MMSE - Mini Mental State Exam  Not completed:  Unable to complete   Orientation to time 0  1  Orientation to Place 0  3  Registration 3  1  Attention/ Calculation 0  0  Recall 0  0  Language- name 2 objects 1  1  Language- repeat 0  1  Language- follow 3 step command 2  1  Language- read & follow direction 1  0  Write a sentence 0  0  Copy design 0  0  Total score 7  8     ASSESSMENT AND PLAN  62 y.o. year old female  has a past medical history of History of substance abuse, Hyperlipidemia, Major neurocognitive disorder (05/28/2019), Menopausal syndrome, Rheumatoid arthritis, S/P lumbar spinal fusion (08/15/2016), and Vitamin D deficiency. here with   No diagnosis found.  Myrka is doing well, today. Memory is fairly stable. MMSE 7/30, previously 8/30 in 05/2020. She has tolerated donepezil 10mg  daily and memantine 10mg  twice daily. We will continue current treatment plan. I have educated the family on the progressive nature of Alzheimer's the disease. I have encouraged that they reach out to [facility] or other local resources for support for family as well as potential participation in memory center. I have encouraged her to stay as active as possible.  Healthy lifestyle habits reviewed.  Memory compensation strategies encouraged. She was encouraged to keep a close eye on BP at home. She will notify PCP if readings are > 140/90 consistently. She will follow-up in 1 year, sooner if needed.  She and her daughter both verbalized understanding and agreement with this plan.  No orders of the defined  types were placed in this encounter.   I spent 30 minutes of face-to-face and non-face-to-face time with patient.  This included previsit chart review, lab review, study review, order entry, electronic health record documentation, patient education.   Shawnie Dapper, MSN, FNP-C 10/16/2022, 12:51 PM  [facility] [address], [address] [place], [place] [address] [phone]

## 2022-10-16 NOTE — Telephone Encounter (Signed)
Donna Jacobs is listed as in office visit. Do we need to change her to Mychart?

## 2022-10-17 ENCOUNTER — Telehealth: Payer: Self-pay | Admitting: Orthopedic Surgery

## 2022-10-17 DIAGNOSIS — Z9181 History of falling: Secondary | ICD-10-CM | POA: Diagnosis not present

## 2022-10-17 DIAGNOSIS — F1910 Other psychoactive substance abuse, uncomplicated: Secondary | ICD-10-CM | POA: Diagnosis not present

## 2022-10-17 DIAGNOSIS — M069 Rheumatoid arthritis, unspecified: Secondary | ICD-10-CM | POA: Diagnosis not present

## 2022-10-17 DIAGNOSIS — M419 Scoliosis, unspecified: Secondary | ICD-10-CM | POA: Diagnosis not present

## 2022-10-17 DIAGNOSIS — Z791 Long term (current) use of non-steroidal anti-inflammatories (NSAID): Secondary | ICD-10-CM | POA: Diagnosis not present

## 2022-10-17 DIAGNOSIS — E785 Hyperlipidemia, unspecified: Secondary | ICD-10-CM | POA: Diagnosis not present

## 2022-10-17 DIAGNOSIS — Z993 Dependence on wheelchair: Secondary | ICD-10-CM | POA: Diagnosis not present

## 2022-10-17 DIAGNOSIS — Z556 Problems related to health literacy: Secondary | ICD-10-CM | POA: Diagnosis not present

## 2022-10-17 DIAGNOSIS — S82852D Displaced trimalleolar fracture of left lower leg, subsequent encounter for closed fracture with routine healing: Secondary | ICD-10-CM | POA: Diagnosis not present

## 2022-10-17 DIAGNOSIS — F0390 Unspecified dementia without behavioral disturbance: Secondary | ICD-10-CM | POA: Diagnosis not present

## 2022-10-17 NOTE — Telephone Encounter (Signed)
Called and sw pt's mother and advised would like to make appt for eval tomorrow. Appt sch for 1:30 tomorrow

## 2022-10-17 NOTE — Telephone Encounter (Signed)
[facility] her incision is redish pinkish and other things. CB#[phone]

## 2022-10-18 ENCOUNTER — Ambulatory Visit (INDEPENDENT_AMBULATORY_CARE_PROVIDER_SITE_OTHER): Payer: Medicare Other | Admitting: Orthopedic Surgery

## 2022-10-18 ENCOUNTER — Encounter: Payer: Self-pay | Admitting: Orthopedic Surgery

## 2022-10-18 DIAGNOSIS — Z8781 Personal history of (healed) traumatic fracture: Secondary | ICD-10-CM

## 2022-10-18 DIAGNOSIS — Z9889 Other specified postprocedural states: Secondary | ICD-10-CM

## 2022-10-18 NOTE — Progress Notes (Signed)
Office Visit Note   Patient: Donna Jacobs           Date of Birth: 19-Feb-1960           MRN: [id number] Visit Date: 10/18/2022              Requested by: Jarrett Soho, PA-C [address] [place],  [place] [address] PCP: Jarrett Soho, PA-C  Chief Complaint  Patient presents with   Left Ankle - Routine Post Op    09/14/2022 ORIF left ankle fx      HPI: Patient is a 62 year old woman who is 4 weeks status post open reduction internal fixation pilon fracture with Weber B fibular fracture.  Patient notices wound breakdown distally over the lateral incision.  Assessment & Plan: Visit Diagnoses:  1. S/P ORIF (open reduction internal fixation) fracture     Plan: Discussed patient has venous insufficiency swelling.  Discussed the importance of elevation, compression, antibiotic ointment dressing change daily.  Use the boot for weightbearing.  Work on range of motion of the ankle.  Follow-up in 2 weeks with repeat three-view radiographs of the left ankle.  Follow-Up Instructions: Return in about 2 weeks (around 11/01/2022).   Ortho Exam  Patient is alert, oriented, no adenopathy, well-dressed, normal affect, normal respiratory effort. Examination the medial incision is completely healed.  Lateral incision there is a small opening distally.  After debridement with gauze this has 100% healthy granulation tissue is 3 x 5 mm and 1 mm deep there is no exposed hardware.  Patient has pitting venous stasis changes to the left leg with brawny edema this is most likely the cause of the wound dehiscence.  She has a strong dorsalis pedis pulse there is no redness no cellulitis no odor no drainage no signs of infection.  Imaging: No results found. No images are attached to the encounter.  Labs: No results found for: "HGBA1C", "ESRSEDRATE", "CRP", "LABURIC", "REPTSTATUS", "GRAMSTAIN", "CULT", "LABORGA"   No results found for: "ALBUMIN", "PREALBUMIN", "CBC"  No results found for:  "MG" No results found for: "VD25OH"  No results found for: "PREALBUMIN"    Latest Ref Rng & Units 09/18/2022    1:22 AM 09/17/2022    3:39 AM 09/16/2022    4:49 AM  CBC EXTENDED  WBC 4.0 - 10.5 K/uL 10.9  9.7  8.3   RBC 3.87 - 5.11 MIL/uL 3.97  3.96  3.69   Hemoglobin 12.0 - 15.0 g/dL 62.1  30.8  65.7   HCT 36.0 - 46.0 % 35.5  35.1  33.3   Platelets 150 - 400 K/uL 245  232  203      There is no height or weight on file to calculate BMI.  Orders:  No orders of the defined types were placed in this encounter.  No orders of the defined types were placed in this encounter.    Procedures: No procedures performed  Clinical Data: No additional findings.  ROS:  All other systems negative, except as noted in the HPI. Review of Systems  Objective: Vital Signs: There were no vitals taken for this visit.  Specialty Comments:  No specialty comments available.  PMFS History: Patient Active Problem List   Diagnosis Date Noted   Trimalleolar fracture of ankle, closed, left, sequela 09/13/2022   Trimalleolar fracture of ankle, closed 09/13/2022   Major neurocognitive disorder, possible Alzheimer's disease 05/28/2019   Menopausal flushing    Menopausal syndrome    Vitamin D deficiency    Hyperlipidemia  Rheumatoid arthritis    History of substance abuse    S/P lumbar spinal fusion 08/15/2016   Past Medical History:  Diagnosis Date   History of substance abuse    Briefly spent time in substance abuse rehabilitation treatment as a teenager. Cocaine likely represented the primary drug of abuse.    Hyperlipidemia    "I used to take med. for cholesteol, but only for a short time"   Major neurocognitive disorder 05/28/2019   Menopausal syndrome    Rheumatoid arthritis    Lumbar - spondylisthesis   S/P lumbar spinal fusion 08/15/2016   Vitamin D deficiency     Family History  Problem Relation Age of Onset   Alzheimer's disease Paternal Grandmother     Past Surgical  History:  Procedure Laterality Date   ABDOMINAL HYSTERECTOMY     ORIF ANKLE FRACTURE Left 09/14/2022   Procedure: OPEN REDUCTION INTERNAL FIXATION (ORIF) ANKLE FRACTURE;  Surgeon: Nadara Mustard, MD;  Location: [facility] OR;  Service: Orthopedics;  Laterality: Left;   TUBAL LIGATION     VAGINAL DELIVERY     x2   Social History   Occupational History   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

## 2022-10-19 DIAGNOSIS — F1910 Other psychoactive substance abuse, uncomplicated: Secondary | ICD-10-CM | POA: Diagnosis not present

## 2022-10-19 DIAGNOSIS — M419 Scoliosis, unspecified: Secondary | ICD-10-CM | POA: Diagnosis not present

## 2022-10-19 DIAGNOSIS — M069 Rheumatoid arthritis, unspecified: Secondary | ICD-10-CM | POA: Diagnosis not present

## 2022-10-19 DIAGNOSIS — Z9181 History of falling: Secondary | ICD-10-CM | POA: Diagnosis not present

## 2022-10-19 DIAGNOSIS — Z993 Dependence on wheelchair: Secondary | ICD-10-CM | POA: Diagnosis not present

## 2022-10-19 DIAGNOSIS — S82852D Displaced trimalleolar fracture of left lower leg, subsequent encounter for closed fracture with routine healing: Secondary | ICD-10-CM | POA: Diagnosis not present

## 2022-10-19 DIAGNOSIS — Z556 Problems related to health literacy: Secondary | ICD-10-CM | POA: Diagnosis not present

## 2022-10-19 DIAGNOSIS — F0390 Unspecified dementia without behavioral disturbance: Secondary | ICD-10-CM | POA: Diagnosis not present

## 2022-10-19 DIAGNOSIS — Z791 Long term (current) use of non-steroidal anti-inflammatories (NSAID): Secondary | ICD-10-CM | POA: Diagnosis not present

## 2022-10-19 DIAGNOSIS — E785 Hyperlipidemia, unspecified: Secondary | ICD-10-CM | POA: Diagnosis not present

## 2022-10-22 ENCOUNTER — Telehealth: Payer: Medicare Other | Admitting: Family Medicine

## 2022-10-22 ENCOUNTER — Telehealth: Payer: Self-pay | Admitting: Family Medicine

## 2022-10-22 DIAGNOSIS — Z556 Problems related to health literacy: Secondary | ICD-10-CM | POA: Diagnosis not present

## 2022-10-22 DIAGNOSIS — M419 Scoliosis, unspecified: Secondary | ICD-10-CM | POA: Diagnosis not present

## 2022-10-22 DIAGNOSIS — Z9181 History of falling: Secondary | ICD-10-CM | POA: Diagnosis not present

## 2022-10-22 DIAGNOSIS — Z791 Long term (current) use of non-steroidal anti-inflammatories (NSAID): Secondary | ICD-10-CM | POA: Diagnosis not present

## 2022-10-22 DIAGNOSIS — F0390 Unspecified dementia without behavioral disturbance: Secondary | ICD-10-CM | POA: Diagnosis not present

## 2022-10-22 DIAGNOSIS — S82852D Displaced trimalleolar fracture of left lower leg, subsequent encounter for closed fracture with routine healing: Secondary | ICD-10-CM | POA: Diagnosis not present

## 2022-10-22 DIAGNOSIS — Z993 Dependence on wheelchair: Secondary | ICD-10-CM | POA: Diagnosis not present

## 2022-10-22 DIAGNOSIS — M069 Rheumatoid arthritis, unspecified: Secondary | ICD-10-CM | POA: Diagnosis not present

## 2022-10-22 DIAGNOSIS — E785 Hyperlipidemia, unspecified: Secondary | ICD-10-CM | POA: Diagnosis not present

## 2022-10-22 DIAGNOSIS — F1910 Other psychoactive substance abuse, uncomplicated: Secondary | ICD-10-CM | POA: Diagnosis not present

## 2022-10-22 NOTE — Telephone Encounter (Signed)
Rescheduled with neurologist due to no availability with nurse practitioner, Amy Lomax.

## 2022-10-22 NOTE — Telephone Encounter (Signed)
Sent mychart msg informing pt of appt cancellation for today- Amy out.

## 2022-10-25 DIAGNOSIS — Z9181 History of falling: Secondary | ICD-10-CM | POA: Diagnosis not present

## 2022-10-25 DIAGNOSIS — E785 Hyperlipidemia, unspecified: Secondary | ICD-10-CM | POA: Diagnosis not present

## 2022-10-25 DIAGNOSIS — Z791 Long term (current) use of non-steroidal anti-inflammatories (NSAID): Secondary | ICD-10-CM | POA: Diagnosis not present

## 2022-10-25 DIAGNOSIS — M419 Scoliosis, unspecified: Secondary | ICD-10-CM | POA: Diagnosis not present

## 2022-10-25 DIAGNOSIS — Z993 Dependence on wheelchair: Secondary | ICD-10-CM | POA: Diagnosis not present

## 2022-10-25 DIAGNOSIS — M069 Rheumatoid arthritis, unspecified: Secondary | ICD-10-CM | POA: Diagnosis not present

## 2022-10-25 DIAGNOSIS — S82852D Displaced trimalleolar fracture of left lower leg, subsequent encounter for closed fracture with routine healing: Secondary | ICD-10-CM | POA: Diagnosis not present

## 2022-10-25 DIAGNOSIS — Z556 Problems related to health literacy: Secondary | ICD-10-CM | POA: Diagnosis not present

## 2022-10-25 DIAGNOSIS — F1910 Other psychoactive substance abuse, uncomplicated: Secondary | ICD-10-CM | POA: Diagnosis not present

## 2022-10-25 DIAGNOSIS — F0390 Unspecified dementia without behavioral disturbance: Secondary | ICD-10-CM | POA: Diagnosis not present

## 2022-10-30 ENCOUNTER — Ambulatory Visit: Payer: Medicare Other | Admitting: Family

## 2022-10-31 DIAGNOSIS — E785 Hyperlipidemia, unspecified: Secondary | ICD-10-CM | POA: Diagnosis not present

## 2022-10-31 DIAGNOSIS — Z791 Long term (current) use of non-steroidal anti-inflammatories (NSAID): Secondary | ICD-10-CM | POA: Diagnosis not present

## 2022-10-31 DIAGNOSIS — F1910 Other psychoactive substance abuse, uncomplicated: Secondary | ICD-10-CM | POA: Diagnosis not present

## 2022-10-31 DIAGNOSIS — M419 Scoliosis, unspecified: Secondary | ICD-10-CM | POA: Diagnosis not present

## 2022-10-31 DIAGNOSIS — F0390 Unspecified dementia without behavioral disturbance: Secondary | ICD-10-CM | POA: Diagnosis not present

## 2022-10-31 DIAGNOSIS — Z993 Dependence on wheelchair: Secondary | ICD-10-CM | POA: Diagnosis not present

## 2022-10-31 DIAGNOSIS — Z556 Problems related to health literacy: Secondary | ICD-10-CM | POA: Diagnosis not present

## 2022-10-31 DIAGNOSIS — M069 Rheumatoid arthritis, unspecified: Secondary | ICD-10-CM | POA: Diagnosis not present

## 2022-10-31 DIAGNOSIS — Z9181 History of falling: Secondary | ICD-10-CM | POA: Diagnosis not present

## 2022-10-31 DIAGNOSIS — S82852D Displaced trimalleolar fracture of left lower leg, subsequent encounter for closed fracture with routine healing: Secondary | ICD-10-CM | POA: Diagnosis not present

## 2022-11-05 ENCOUNTER — Other Ambulatory Visit (INDEPENDENT_AMBULATORY_CARE_PROVIDER_SITE_OTHER): Payer: Medicare Other

## 2022-11-05 ENCOUNTER — Ambulatory Visit (INDEPENDENT_AMBULATORY_CARE_PROVIDER_SITE_OTHER): Payer: Medicare Other | Admitting: Orthopedic Surgery

## 2022-11-05 DIAGNOSIS — Z8781 Personal history of (healed) traumatic fracture: Secondary | ICD-10-CM | POA: Diagnosis not present

## 2022-11-05 DIAGNOSIS — Z9889 Other specified postprocedural states: Secondary | ICD-10-CM

## 2022-11-06 ENCOUNTER — Encounter: Payer: Self-pay | Admitting: Orthopedic Surgery

## 2022-11-06 ENCOUNTER — Other Ambulatory Visit: Payer: Self-pay

## 2022-11-06 DIAGNOSIS — Z556 Problems related to health literacy: Secondary | ICD-10-CM | POA: Diagnosis not present

## 2022-11-06 DIAGNOSIS — F0390 Unspecified dementia without behavioral disturbance: Secondary | ICD-10-CM | POA: Diagnosis not present

## 2022-11-06 DIAGNOSIS — E785 Hyperlipidemia, unspecified: Secondary | ICD-10-CM | POA: Diagnosis not present

## 2022-11-06 DIAGNOSIS — S82852D Displaced trimalleolar fracture of left lower leg, subsequent encounter for closed fracture with routine healing: Secondary | ICD-10-CM | POA: Diagnosis not present

## 2022-11-06 DIAGNOSIS — Z993 Dependence on wheelchair: Secondary | ICD-10-CM | POA: Diagnosis not present

## 2022-11-06 DIAGNOSIS — Z9181 History of falling: Secondary | ICD-10-CM | POA: Diagnosis not present

## 2022-11-06 DIAGNOSIS — Z791 Long term (current) use of non-steroidal anti-inflammatories (NSAID): Secondary | ICD-10-CM | POA: Diagnosis not present

## 2022-11-06 DIAGNOSIS — M069 Rheumatoid arthritis, unspecified: Secondary | ICD-10-CM | POA: Diagnosis not present

## 2022-11-06 DIAGNOSIS — F1910 Other psychoactive substance abuse, uncomplicated: Secondary | ICD-10-CM | POA: Diagnosis not present

## 2022-11-06 DIAGNOSIS — M419 Scoliosis, unspecified: Secondary | ICD-10-CM | POA: Diagnosis not present

## 2022-11-06 MED ORDER — MEMANTINE HCL 10 MG PO TABS: 10.0000 mg | ORAL_TABLET | Freq: Two times a day (BID) | ORAL | 2 refills | Status: DC

## 2022-11-06 MED ORDER — DONEPEZIL HCL 10 MG PO TABS: ORAL_TABLET | ORAL | 2 refills | Status: DC

## 2022-11-06 NOTE — Progress Notes (Signed)
Office Visit Note   Patient: Donna Jacobs           Date of Birth: 08-28-1960           MRN: [id number] Visit Date: 11/05/2022              Requested by: Jarrett Soho, PA-C [address] [place],  [place] [address] PCP: Jarrett Soho, PA-C  Chief Complaint  Patient presents with   Left Ankle - Routine Post Op    09/14/2022 ORIF left ankle fx      HPI: Patient is a 62 year old woman who is 2 months status post open reduction internal fixation left ankle fracture bimalleolar with a large shear medial malleolar component.  Patient is in a fracture boot nonweightbearing.  Assessment & Plan: Visit Diagnoses:  1. S/P ORIF (open reduction internal fixation) fracture     Plan: Will advance to weightbearing as tolerated in the fracture boot.  Patient is provided a prescription for physical therapy that she will obtain close to home.  Follow-Up Instructions: Return in about 4 weeks (around 12/03/2022).   Ortho Exam  Patient is alert, oriented, no adenopathy, well-dressed, normal affect, normal respiratory effort. Examination the incisions are healed there is no drainage no cellulitis no signs of infection.  She has good range of motion of the ankle.  Imaging: XR Ankle Complete Left  Result Date: 11/06/2022 Three-view radiographs of the left ankle shows a stable healed bimalleolar fracture with a congruent mortise.  No images are attached to the encounter.  Labs: No results found for: "HGBA1C", "ESRSEDRATE", "CRP", "LABURIC", "REPTSTATUS", "GRAMSTAIN", "CULT", "LABORGA"   No results found for: "ALBUMIN", "PREALBUMIN", "CBC"  No results found for: "MG" No results found for: "VD25OH"  No results found for: "PREALBUMIN"    Latest Ref Rng & Units 09/18/2022    1:22 AM 09/17/2022    3:39 AM 09/16/2022    4:49 AM  CBC EXTENDED  WBC 4.0 - 10.5 K/uL 10.9  9.7  8.3   RBC 3.87 - 5.11 MIL/uL 3.97  3.96  3.69   Hemoglobin 12.0 - 15.0 g/dL 84.6  96.2  95.2   HCT 36.0 -  46.0 % 35.5  35.1  33.3   Platelets 150 - 400 K/uL 245  232  203      There is no height or weight on file to calculate BMI.  Orders:  Orders Placed This Encounter  Procedures   XR Ankle Complete Left   No orders of the defined types were placed in this encounter.    Procedures: No procedures performed  Clinical Data: No additional findings.  ROS:  All other systems negative, except as noted in the HPI. Review of Systems  Objective: Vital Signs: There were no vitals taken for this visit.  Specialty Comments:  No specialty comments available.  PMFS History: Patient Active Problem List   Diagnosis Date Noted   Trimalleolar fracture of ankle, closed, left, sequela 09/13/2022   Trimalleolar fracture of ankle, closed 09/13/2022   Major neurocognitive disorder, possible Alzheimer's disease 05/28/2019   Menopausal flushing    Menopausal syndrome    Vitamin D deficiency    Hyperlipidemia    Rheumatoid arthritis    History of substance abuse    S/P lumbar spinal fusion 08/15/2016   Past Medical History:  Diagnosis Date   History of substance abuse    Briefly spent time in substance abuse rehabilitation treatment as a teenager. Cocaine likely represented the primary drug of abuse.  Hyperlipidemia    "I used to take med. for cholesteol, but only for a short time"   Major neurocognitive disorder 05/28/2019   Menopausal syndrome    Rheumatoid arthritis    Lumbar - spondylisthesis   S/P lumbar spinal fusion 08/15/2016   Vitamin D deficiency     Family History  Problem Relation Age of Onset   Alzheimer's disease Paternal Grandmother     Past Surgical History:  Procedure Laterality Date   ABDOMINAL HYSTERECTOMY     ORIF ANKLE FRACTURE Left 09/14/2022   Procedure: OPEN REDUCTION INTERNAL FIXATION (ORIF) ANKLE FRACTURE;  Surgeon: Nadara Mustard, MD;  Location: [facility] OR;  Service: Orthopedics;  Laterality: Left;   TUBAL LIGATION     VAGINAL DELIVERY     x2   Social  History   Occupational History   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

## 2022-11-07 DIAGNOSIS — Z993 Dependence on wheelchair: Secondary | ICD-10-CM | POA: Diagnosis not present

## 2022-11-07 DIAGNOSIS — F1910 Other psychoactive substance abuse, uncomplicated: Secondary | ICD-10-CM | POA: Diagnosis not present

## 2022-11-07 DIAGNOSIS — F0390 Unspecified dementia without behavioral disturbance: Secondary | ICD-10-CM | POA: Diagnosis not present

## 2022-11-07 DIAGNOSIS — Z9181 History of falling: Secondary | ICD-10-CM | POA: Diagnosis not present

## 2022-11-07 DIAGNOSIS — M069 Rheumatoid arthritis, unspecified: Secondary | ICD-10-CM | POA: Diagnosis not present

## 2022-11-07 DIAGNOSIS — Z791 Long term (current) use of non-steroidal anti-inflammatories (NSAID): Secondary | ICD-10-CM | POA: Diagnosis not present

## 2022-11-07 DIAGNOSIS — Z556 Problems related to health literacy: Secondary | ICD-10-CM | POA: Diagnosis not present

## 2022-11-07 DIAGNOSIS — E785 Hyperlipidemia, unspecified: Secondary | ICD-10-CM | POA: Diagnosis not present

## 2022-11-07 DIAGNOSIS — S82852D Displaced trimalleolar fracture of left lower leg, subsequent encounter for closed fracture with routine healing: Secondary | ICD-10-CM | POA: Diagnosis not present

## 2022-11-07 DIAGNOSIS — M419 Scoliosis, unspecified: Secondary | ICD-10-CM | POA: Diagnosis not present

## 2022-11-09 ENCOUNTER — Other Ambulatory Visit: Payer: Self-pay

## 2022-11-09 ENCOUNTER — Telehealth: Payer: Self-pay | Admitting: Orthopedic Surgery

## 2022-11-09 DIAGNOSIS — Z8781 Personal history of (healed) traumatic fracture: Secondary | ICD-10-CM

## 2022-11-09 NOTE — Telephone Encounter (Signed)
I called and advised that Dr. Lajoyce Corners had given rx for PT as they did not know where they wanted to go at the time of there appt this Monday and that is why a referral was not placed. They were to call and decide where they wanted to go. [facility]. Referral entered into chart and will call with any questions.

## 2022-11-09 NOTE — Telephone Encounter (Signed)
Patient daughter wanted to know if her mom was supposed to sign her up for Rehab or was it a referral? She has questions on what to do with her because she hasn't heard anything. CB#[phone]

## 2022-11-14 DIAGNOSIS — E785 Hyperlipidemia, unspecified: Secondary | ICD-10-CM | POA: Diagnosis not present

## 2022-11-14 DIAGNOSIS — F0390 Unspecified dementia without behavioral disturbance: Secondary | ICD-10-CM | POA: Diagnosis not present

## 2022-11-14 DIAGNOSIS — M069 Rheumatoid arthritis, unspecified: Secondary | ICD-10-CM | POA: Diagnosis not present

## 2022-11-14 DIAGNOSIS — Z993 Dependence on wheelchair: Secondary | ICD-10-CM | POA: Diagnosis not present

## 2022-11-14 DIAGNOSIS — Z556 Problems related to health literacy: Secondary | ICD-10-CM | POA: Diagnosis not present

## 2022-11-14 DIAGNOSIS — M419 Scoliosis, unspecified: Secondary | ICD-10-CM | POA: Diagnosis not present

## 2022-11-14 DIAGNOSIS — Z791 Long term (current) use of non-steroidal anti-inflammatories (NSAID): Secondary | ICD-10-CM | POA: Diagnosis not present

## 2022-11-14 DIAGNOSIS — Z9181 History of falling: Secondary | ICD-10-CM | POA: Diagnosis not present

## 2022-11-14 DIAGNOSIS — F1910 Other psychoactive substance abuse, uncomplicated: Secondary | ICD-10-CM | POA: Diagnosis not present

## 2022-11-14 DIAGNOSIS — S82852D Displaced trimalleolar fracture of left lower leg, subsequent encounter for closed fracture with routine healing: Secondary | ICD-10-CM | POA: Diagnosis not present

## 2022-11-19 DIAGNOSIS — M419 Scoliosis, unspecified: Secondary | ICD-10-CM | POA: Diagnosis not present

## 2022-11-19 DIAGNOSIS — Z9181 History of falling: Secondary | ICD-10-CM | POA: Diagnosis not present

## 2022-11-19 DIAGNOSIS — E785 Hyperlipidemia, unspecified: Secondary | ICD-10-CM | POA: Diagnosis not present

## 2022-11-19 DIAGNOSIS — Z993 Dependence on wheelchair: Secondary | ICD-10-CM | POA: Diagnosis not present

## 2022-11-19 DIAGNOSIS — F0390 Unspecified dementia without behavioral disturbance: Secondary | ICD-10-CM | POA: Diagnosis not present

## 2022-11-19 DIAGNOSIS — Z556 Problems related to health literacy: Secondary | ICD-10-CM | POA: Diagnosis not present

## 2022-11-19 DIAGNOSIS — Z791 Long term (current) use of non-steroidal anti-inflammatories (NSAID): Secondary | ICD-10-CM | POA: Diagnosis not present

## 2022-11-19 DIAGNOSIS — F1910 Other psychoactive substance abuse, uncomplicated: Secondary | ICD-10-CM | POA: Diagnosis not present

## 2022-11-19 DIAGNOSIS — S82852D Displaced trimalleolar fracture of left lower leg, subsequent encounter for closed fracture with routine healing: Secondary | ICD-10-CM | POA: Diagnosis not present

## 2022-11-19 DIAGNOSIS — M069 Rheumatoid arthritis, unspecified: Secondary | ICD-10-CM | POA: Diagnosis not present

## 2022-11-21 DIAGNOSIS — Z9181 History of falling: Secondary | ICD-10-CM | POA: Diagnosis not present

## 2022-11-21 DIAGNOSIS — F1910 Other psychoactive substance abuse, uncomplicated: Secondary | ICD-10-CM | POA: Diagnosis not present

## 2022-11-21 DIAGNOSIS — Z993 Dependence on wheelchair: Secondary | ICD-10-CM | POA: Diagnosis not present

## 2022-11-21 DIAGNOSIS — M419 Scoliosis, unspecified: Secondary | ICD-10-CM | POA: Diagnosis not present

## 2022-11-21 DIAGNOSIS — M069 Rheumatoid arthritis, unspecified: Secondary | ICD-10-CM | POA: Diagnosis not present

## 2022-11-21 DIAGNOSIS — Z556 Problems related to health literacy: Secondary | ICD-10-CM | POA: Diagnosis not present

## 2022-11-21 DIAGNOSIS — Z791 Long term (current) use of non-steroidal anti-inflammatories (NSAID): Secondary | ICD-10-CM | POA: Diagnosis not present

## 2022-11-21 DIAGNOSIS — F0390 Unspecified dementia without behavioral disturbance: Secondary | ICD-10-CM | POA: Diagnosis not present

## 2022-11-21 DIAGNOSIS — S82852D Displaced trimalleolar fracture of left lower leg, subsequent encounter for closed fracture with routine healing: Secondary | ICD-10-CM | POA: Diagnosis not present

## 2022-11-21 DIAGNOSIS — E785 Hyperlipidemia, unspecified: Secondary | ICD-10-CM | POA: Diagnosis not present

## 2022-12-03 ENCOUNTER — Ambulatory Visit: Payer: Medicare Other | Admitting: Orthopedic Surgery

## 2022-12-03 DIAGNOSIS — R269 Unspecified abnormalities of gait and mobility: Secondary | ICD-10-CM | POA: Diagnosis not present

## 2022-12-03 DIAGNOSIS — M25572 Pain in left ankle and joints of left foot: Secondary | ICD-10-CM | POA: Diagnosis not present

## 2022-12-05 ENCOUNTER — Ambulatory Visit: Payer: Medicare Other | Admitting: Neurology

## 2022-12-05 ENCOUNTER — Encounter: Payer: Self-pay | Admitting: Neurology

## 2022-12-05 VITALS — BP 136/86 | HR 73 | Ht <= 58 in | Wt 189.8 lb

## 2022-12-05 DIAGNOSIS — F0280 Dementia in other diseases classified elsewhere without behavioral disturbance: Secondary | ICD-10-CM

## 2022-12-05 DIAGNOSIS — Z9181 History of falling: Secondary | ICD-10-CM | POA: Diagnosis not present

## 2022-12-05 DIAGNOSIS — G300 Alzheimer's disease with early onset: Secondary | ICD-10-CM

## 2022-12-05 NOTE — Progress Notes (Signed)
Subjective:    Patient ID: Donna Jacobs is a 62 y.o. female.  HPI    Interim history:   Donna Jacobs is a 62 year old right-handed woman with an underlying medical history of vitamin D deficiency, hyperlipidemia, arthritis, former smoking and obesity, who presents for follow-up consultation of her early onset Alzheimer's dementia.  The patient is accompanied by her husband and her daughter-in-law today.  She saw Amy Lomax on 10/25/2021, at which time she was on donepezil 10 mg daily and memantine 10 mg twice daily.  She was fairly independent with her ADLs at the time.  Mood was stable.  She had anxiety especially surrounding doctors visits. Her daughter-in-law messaged in the interim reporting that she was having difficulty sleeping and staying asleep particularly with early morning awakenings.  They were advised to try melatonin over-the-counter or valerian root extract.  Today, 12/05/2022: She reports feeling okay, provides limited information, most of her history is provided by her husband and her daughter-in-law.  She had a fall in August and shattered her left ankle.  She required surgery, her surgeon was Dr. Lajoyce Corners.  She had in-home therapy with OT and PT and home health.  She is now doing outpatient physical therapy once a week, literally started this week.  She has difficulty sleeping at night.  They have not tried melatonin yet.  She does not use a walker or cane.  She has walking aids available at home but does not always know how to maneuver them.  The walker seems to be in the way according to her daughter-in-law.  She has a reasonably good appetite.  They encouraged her to drink water.  She has a pending appointment for her physical with blood work also.  She had a remote history of back surgery and has scoliosis.  She has been walking with a limp but it has become worse since her fall and surgery.  The patient's allergies, current medications, family history, past medical history, past social  history, past surgical history and problem list were reviewed and updated as appropriate.    Previously:   12/26/20( Amy Lomax, NP): <<Donna Jacobs returns for follow up for early onset AD. She continues donepezil 10mg  daily and memantine 5mg  BID. She is doing fairly well. No significant changes. She does have more difficulty getting into her son's truck. She doesn't remember what foot to put on the step first. She continues to be mostly independent with ADLs. She does not drink much water, loves Dr Reino Kent. Appetite is good. She is less active. Mood is good. She lives with her husband. She does not drive. BP is normally good. They have not checked at home but reports normal readings at PCP visits. She is asymptomatic today.>>    06/14/2020 (Amy Lomax, NP): <<Donna Jacobs returns for follow up for early onset AD. She continues Aricept 10mg  daily. Namenda 5mg  BID was added at last follow up in 12/2019. Mood has been much better. She is less irritable. She continues to live with her husband, [name]. She requires some assistance with ADL's. Medications are given by her family. She is eating and sleeping well. No changes in gait. No falls. She is not as active as she used to be. She loves to sit and watch TV. She likes to drink Dr Reino Kent.>>      01/26/2020 (AL): <<Donna Jacobs is a 62 y.o. female here today for follow up for early onset AD. Overall, she seems to be about the same since last being seen.  Her daughter in law presents with her today and aids in history. She reports that around September, she noticed that she seemed more irritable and depressed. No previous history of depression. No concerns of SI/HI. She was a very active person when healthy. Now she is not as active and gets very frustrated when she can't go out, shop or visit with friends. She does not seem to understand why she is not able to go out.  She lives with her husband. She does not drive. Mr Pilling gives medications and prepares all foods. Mrs Flett is  able to perform most other ADL's independently.>>      09/21/2019 (SA): She reports doing fairly well.  Sometimes she feels her donepezil was making her tired and her eyelids become heavy.  She has a routine eye examination pending for later this year in November.  She has not fallen asleep inadvertently during the day.  In the first 2 weeks after starting the full dose of donepezil she was notably irritable per daughter-in-law.  She had some anger type reactions that were not like her.  It did help to take a break and go to the beach with the family.  She did well with that.  She has had some intermittent diarrhea but has a history of having diarrhea in the past, she saw GI in the past, has not seen a GI specialist as of late.  All in all, she is able to tolerate the donepezil and she has not had any mood irritability lately.  She is interested in watching TV.  She does not do a whole lot in terms of other activities.  She has not been able to exercise on a regular basis, walking outside has been possible intermittently weather permitting.  She has increased her water intake and decreased her soda intake.  She has not fallen recently.  She has seen rheumatology and had significant amount of blood work per daughter-in-law and was told to start vitamin D.  She has been using a gummy type multivitamin, no separate vitamin D.  They are not sure how much vitamin D is in it.    I saw her on 06/18/2019, at which time we talked about her recent test results including her MRI and neuropsychological evaluation.  She was agreeable to pursuing a PET scan.  She was advised to start Aricept 10 mg strength half a pill with increase to 1 pill after about a month.  She had an interim brain metabolic PET scan on 07/14/2019 and I reviewed the results: IMPRESSION: 1. Symmetric decreased radiotracer activity within bilateral parietal lobes compatible with a pattern seen with Alzheimer's dementia.   Her mother was contacted with  her test results.      I first met her on 12/16/2018 at the request of her primary care, at which time the patient reported a history of memory loss, she had been accompanied by her friend at the time.  She had recently had blood work through her primary care and I suggested further work-up in the form of brain MRI and also seeking consultation with neuropsychology.   She had a brain MRI without contrast on 12/30/2018 and I reviewed the results: :    MRI brain (without) demonstrating: - Mild periventricular and subcortical foci of T2 hyperintensities. These findings are non-specific and considerations include autoimmune, inflammatory, post-infectious, microvascular ischemic or migraine associated etiologies.  - No acute findings.   She had consultation, evaluation and feedback appointment with Dr. Milbert Coulter, neuropsychologist  at [facility] neurolog[facility] and I reviewed his impression and recommendations.      His evaluation was supportive of early onset Alzheimer's disease.  Further evaluation was recommended in the form of PET scan or CSF evaluation.  Medication recommendations included anticholinergic dementia medication or Namenda.     12/16/18: (She) reports memory loss for the past 3+ years.  The patient has had forgetfulness and often misplaces things.  She has been driving.  Her friend has been able to observe her driving and feels that she has done well with local and familiar routes.  However, patient does report that she has gotten lost driving at times.  She continues to work.  She works for [employer], [employer], and has worked for the same company for over 30 years. She has had some mood issues including frustration, her husband has also been frustrated with her as I understand.  She lives with her husband, she has 2 grown sons.  She has 3 grandsons.  She quit smoking over 20 years ago.  She has a remote history of drug abuse and was in rehab when she was a teenager or young adults.  She admits to  trying cocaine but denies any IV drug use, she denies any recent drug use or relapse, she has smoked marijuana as well.  She does not hydrate very well with water she admits.  She likes to drink Diet Coke.  She is not sure how many cans she drinks per day.  She does not currently drink any alcohol.  She denies any heavy alcohol use or alcohol abuse in the past.  She does not snore.  Neysa Bonito has not noticed any gasping sounds or snoring sounds when they have shared a room before on a trip. She reports that her paternal grandmother had Alzheimer's disease.  Paternal aunt had no dementia.  The patient is the oldest of 3, she has a brother and a sister, neither 1 have memory loss.  I reviewed your office note from 10/27/2018.  She was referred to rheumatology at the time for a presumed diagnosis of rheumatoid arthritis.  She was also referred to orthopedics for leg length discrepancy.  She had blood work at the time including CBC with differential, B12, TSH, vitamin D.  Her B12 level was 257, vitamin D low at 21.9, TSH was normal, CBC with differential was unremarkable.  She is followed by rheumatology for her arthritis and prior diagnosis of rheumatoid arthritis.  Her Past Medical History Is Significant For: Past Medical History:  Diagnosis Date   History of substance abuse    Briefly spent time in substance abuse rehabilitation treatment as a teenager. Cocaine likely represented the primary drug of abuse.    Hyperlipidemia    "I used to take med. for cholesteol, but only for a short time"   Major neurocognitive disorder 05/28/2019   Menopausal syndrome    Rheumatoid arthritis    Lumbar - spondylisthesis   S/P lumbar spinal fusion 08/15/2016   Vitamin D deficiency     Her Past Surgical History Is Significant For: Past Surgical History:  Procedure Laterality Date   ABDOMINAL HYSTERECTOMY     ORIF ANKLE FRACTURE Left 09/14/2022   Procedure: OPEN REDUCTION INTERNAL FIXATION (ORIF) ANKLE FRACTURE;   Surgeon: Nadara Mustard, MD;  Location: [facility] OR;  Service: Orthopedics;  Laterality: Left;   TUBAL LIGATION     VAGINAL DELIVERY     x2    Her Family History Is Significant For: Family  History  Problem Relation Age of Onset   Alzheimer's disease Paternal Grandmother     Her Social History Is Significant For: Social History   Socioeconomic History   Marital status: Married    Spouse name: Not on file   Number of children: Not on file   Years of education: 12   Highest education level: High school graduate  Occupational History   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[employer]  Tobacco Use   Smoking status: Former    Current packs/day: 0.00    Types: Cigarettes    Quit date: 08/10/1975    Years since quitting: 47.3   Smokeless tobacco: Never  Substance and Sexual Activity   Alcohol use: Yes    Comment: very rarely   Drug use: Not Currently    Types: Cocaine, Marijuana   Sexual activity: Not on file  Other Topics Concern   Not on file  Social History Narrative   Not on file   Social Determinants of Health   Financial Resource Strain: Not on file  Food Insecurity: Not on file  Transportation Needs: Not on file  Physical Activity: Not on file  Stress: Not on file  Social Connections: Not on file    Her Allergies Are:  Allergies  Allergen Reactions   Atorvastatin Other (See Comments)    Other Reaction(s): myalgia    Lipitor [Atorvastatin Calcium]    Codeine Nausea And Vomiting  :   Her Current Medications Are:  Outpatient Encounter Medications as of 12/05/2022  Medication Sig   donepezil (ARICEPT) 10 MG tablet TAKE 1 TABLET BY MOUTH AT BEDTIME   memantine (NAMENDA) 10 MG tablet Take 1 tablet (10 mg total) by mouth 2 (two) times daily.   Multiple Vitamins-Minerals (MULTIVITAMIN WOMEN) TABS Take 1 tablet by mouth daily in the afternoon.   rosuvastatin (CRESTOR) 10 MG tablet Take 10 mg by mouth daily.   [DISCONTINUED] acetaminophen (TYLENOL) 325 MG  tablet Take 1-2 tablets (325-650 mg total) by mouth every 6 (six) hours as needed for mild pain (pain score 1-3 or temp > 100.5).   [DISCONTINUED] ezetimibe (ZETIA) 10 MG tablet Take 10 mg by mouth daily. (Patient not taking: Reported on 12/05/2022)   [DISCONTINUED] naproxen (NAPROSYN) 375 MG tablet Take 1 tablet (375 mg total) by mouth 2 (two) times daily.   [DISCONTINUED] polyethylene glycol (MIRALAX / GLYCOLAX) 17 g packet Take 17 g by mouth daily as needed for mild constipation.   [DISCONTINUED] senna-docusate (SENOKOT-S) 8.6-50 MG tablet Take 1 tablet by mouth at bedtime as needed for mild constipation.   No facility-administered encounter medications on file as of 12/05/2022.  :  Review of Systems:  Out of a complete 14 point review of systems, all are reviewed and negative with the exception of these symptoms as listed below:   Review of Systems  Neurological:        Donna Jacobs Primary care giver since June 30, 2022.  Rehab- not a good place.  [facility] now doing outpt therapy.  Issues with sleep (wakes and not able to go back to sleep).  Asking for Handicap sticker?  Wants AVS printout today.     Objective:  Neurological Exam  Physical Exam Physical Examination:   Vitals:   12/05/22 0938  BP: 136/86  Pulse: 73  SpO2: 98%    General Examination: The patient is a very pleasant 62 y.o. female in no acute distress. She appears well-developed and well-nourished and well groomed.   HEENT:  Normocephalic, atraumatic, pupils are equal, round and reactive to light. Extraocular tracking is good without limitation to gaze excursion or nystagmus noted. Normal smooth pursuit is noted. Hearing is grossly intact. Face is symmetric with normal facial animation and normal facial sensation. Speech is clear with no dysarthria noted. There is no hypophonia. There is no lip, neck/head, jaw or voice tremor. Neck is supple with full range of passive and active motion. Oropharynx exam reveals: mild to  moderate mouth dryness, adequate dental hygiene. Tongue protrudes centrally and palate elevates symmetrically.    Chest: Clear to auscultation without wheezing, rhonchi or crackles noted.   Heart: S1+S2+0, regular and normal without murmurs, rubs or gallops noted.    Abdomen: Soft, non-tender and non-distended.   Extremities: There is puffiness around both ankles.     Skin: Warm and dry without trophic changes noted.   Musculoskeletal: exam reveals increase in lumbar kyphosis.  She walks with a significant limp.  Status post left ankle surgery with unremarkable scars.     Neurologically:  Mental status: The patient is awake, alert and pays good attention, she is oriented to circumstance and self.  Immediate and remote memory, attention, language skills and fund of knowledge are Impaired, she is unable to give details on her history. Speech is scant and without obvious dysarthria.  Mood is normal and affect is good.        10/25/2021    1:53 PM 12/26/2020    9:00 AM 06/14/2020    9:53 AM 01/26/2020    9:26 AM 09/21/2019    8:50 AM 12/16/2018    2:48 PM  MMSE - Mini Mental State Exam  Not completed:  Unable to complete      Orientation to time 0  1 0 1 1  Orientation to Place 0  3 3 2 1   Registration 3  1 3 3 3   Attention/ Calculation 0  0 0 0 0  Recall 0  0 0 0 1  Language- name 2 objects 1  1 2 2 2   Language- repeat 0  1 1 1 1   Language- follow 3 step command 2  1 2 1  0  Language- read & follow direction 1  0 1 1 1   Write a sentence 0  0 0 0 0  Copy design 0  0 0 0 0  Copy design-comments    3 animals    Total score 7  8 12 11 10     On 12/16/2018: MMSE: 10/30, CDT: 0/4, AFT: 4/min.    On 09/21/2019: MMSE: 11/30, CDT: 1/4, AFT: 2/min.   Cranial nerves II - XII are as described above under HEENT exam. In addition: shoulder shrug is normal with equal shoulder height noted. Motor exam: Normal bulk, moving all 4 extremities without limitation.  No obvious resting or action  tremor.   Fine motor skills and coordination: grossly intact.  Cerebellar testing: No dysmetria or intention tremor. There is no truncal or gait ataxia.  Sensory exam: intact to light touch in the upper and lower extremities.  Gait, station and balance: She stands up with mild difficulty.  She does not need assistance, she pushes herself up.  She walks without a walking aid with a significant limp, unequal hip height noted, increase in lumbar kyphosis.     Assessment and Plan:  In summary, Donna Jacobs is a 62 year old right-handed woman with an underlying medical history of vitamin D deficiency, hyperlipidemia, arthritis, former smoking, status post fall with left  ankle fracture and status post surgery in August 2024, and obesity, who presents for follow-up consultation of her early onset Alzheimer's dementia.  She has been stable on donepezil 10 mg daily and memantine 10 mg twice daily. Symptoms have been progressive.  We talked about the importance of fall prevention and the challenges of advancing dementia today.  As far as workup in the past, she had a brain MRI, she had a metabolic brain PET scan was supportive of Alzheimer's type changes and neuropsychological evaluation was also supportive of early onset Alzheimer's disease. She has a good support system.  She is currently in physical therapy and started outpatient physical therapy this week.  We talked about the importance of physical activity, good nutrition and good hydration and supervision.  Her family is going to provide care and in-home.  She has had difficulty maintaining sleep.  She used to wake up early for work around 4 AM and still has early morning awakenings.  They are encouraged to try low-dose melatonin.  However, taking melatonin and waking up in the middle of the night for example to go to the bathroom does increase her fall risk even though it is not a very strongly sedating medication.  I would stick with low-dose melatonin, they  can also try valerian root extract or sleepy time tea at night.  They are encouraged to talk to physical therapy as well as orthopedics about the use of a walker and a temporary handicap sticker from orthopedics.  They are advised to follow-up in this clinic routinely in 1 year, they can see Shawnie Dapper, NP again at the time.  I answered all the questions today and the patient and her family were in agreement. I spent 40 minutes in total face-to-face time and in reviewing records during pre-charting, more than 50% of which was spent in counseling and coordination of care, reviewing test results, reviewing medications and treatment regimen and/or in discussing or reviewing the diagnosis of advanced dementia, the prognosis and treatment options. Pertinent laboratory and imaging test results that were available during this visit with the patient were reviewed by me and considered in my medical decision making (see chart for details).

## 2022-12-05 NOTE — Patient Instructions (Signed)
It was nice to see you again today.  Continue your memory medications, make sure you have a full check up with PCP and blood work through them.  You can try Melatonin at night for sleep: take 2 mg or 3 mg, one hour before your bedtime. You can go up to 5 mg if needed, even 10 mg with time. It is over the counter and comes in pill form, chewable form and spray, if you prefer.   You can try sleepytime tea or valerian root extract for sleep as well.  Talk to PT about using a walker.  Follow up in 1 year with Amy.

## 2022-12-11 DIAGNOSIS — R269 Unspecified abnormalities of gait and mobility: Secondary | ICD-10-CM | POA: Diagnosis not present

## 2022-12-11 DIAGNOSIS — M25572 Pain in left ankle and joints of left foot: Secondary | ICD-10-CM | POA: Diagnosis not present

## 2022-12-19 ENCOUNTER — Other Ambulatory Visit: Payer: Self-pay | Admitting: Family Medicine

## 2022-12-19 DIAGNOSIS — F0280 Dementia in other diseases classified elsewhere without behavioral disturbance: Secondary | ICD-10-CM | POA: Diagnosis not present

## 2022-12-19 DIAGNOSIS — G300 Alzheimer's disease with early onset: Secondary | ICD-10-CM | POA: Diagnosis not present

## 2022-12-19 DIAGNOSIS — E559 Vitamin D deficiency, unspecified: Secondary | ICD-10-CM | POA: Diagnosis not present

## 2022-12-19 DIAGNOSIS — I700 Atherosclerosis of aorta: Secondary | ICD-10-CM | POA: Diagnosis not present

## 2022-12-19 DIAGNOSIS — I10 Essential (primary) hypertension: Secondary | ICD-10-CM | POA: Diagnosis not present

## 2022-12-19 DIAGNOSIS — Z1231 Encounter for screening mammogram for malignant neoplasm of breast: Secondary | ICD-10-CM

## 2022-12-19 DIAGNOSIS — E7800 Pure hypercholesterolemia, unspecified: Secondary | ICD-10-CM | POA: Diagnosis not present

## 2022-12-19 DIAGNOSIS — Z780 Asymptomatic menopausal state: Secondary | ICD-10-CM

## 2022-12-21 DIAGNOSIS — M25572 Pain in left ankle and joints of left foot: Secondary | ICD-10-CM | POA: Diagnosis not present

## 2022-12-21 DIAGNOSIS — R269 Unspecified abnormalities of gait and mobility: Secondary | ICD-10-CM | POA: Diagnosis not present

## 2022-12-25 DIAGNOSIS — R269 Unspecified abnormalities of gait and mobility: Secondary | ICD-10-CM | POA: Diagnosis not present

## 2022-12-25 DIAGNOSIS — M25572 Pain in left ankle and joints of left foot: Secondary | ICD-10-CM | POA: Diagnosis not present

## 2022-12-31 ENCOUNTER — Ambulatory Visit: Payer: Medicare Other | Admitting: Orthopedic Surgery

## 2022-12-31 ENCOUNTER — Encounter: Payer: Self-pay | Admitting: Orthopedic Surgery

## 2022-12-31 DIAGNOSIS — Z9889 Other specified postprocedural states: Secondary | ICD-10-CM

## 2022-12-31 DIAGNOSIS — Z8781 Personal history of (healed) traumatic fracture: Secondary | ICD-10-CM

## 2022-12-31 DIAGNOSIS — R29898 Other symptoms and signs involving the musculoskeletal system: Secondary | ICD-10-CM | POA: Diagnosis not present

## 2022-12-31 NOTE — Progress Notes (Signed)
Office Visit Note   Patient: Donna Jacobs           Date of Birth: [birth year]           MRN: [id number] Visit Date: 12/31/2022              Requested by: Jarrett Soho, PA-C [address] [place],  [place] [address] PCP: Jarrett Soho, PA-C  Chief Complaint  Patient presents with   Left Ankle - Follow-up    09/14/2022 ORIF left ankle fx      HPI: Patient is a 62 year old woman who is 4 months status post open reduction internal fixation left ankle fracture.  Patient is extremely deconditioned and has problem with gait and balance.  She is currently wearing sneakers and has instability in her sneakers.  Assessment & Plan: Visit Diagnoses:  1. S/P ORIF (open reduction internal fixation) fracture   2. Weakness of both legs     Plan: A prescription was written for patient to continue with her current therapy and [place] for strengthening of both lower extremities.  Recommended new balance walking sneakers to assist with her gait and balance.  Follow-Up Instructions: Return if symptoms worsen or fail to improve.   Ortho Exam  Patient is alert, oriented, no adenopathy, well-dressed, normal affect, normal respiratory effort. Examination patient has difficulty sit to stand and with turning.  With patient standing her pelvis is level she does have a scoliosis curve but no pelvic obliquity  Imaging: No results found. No images are attached to the encounter.  Labs: No results found for: "HGBA1C", "ESRSEDRATE", "CRP", "LABURIC", "REPTSTATUS", "GRAMSTAIN", "CULT", "LABORGA"   No results found for: "ALBUMIN", "PREALBUMIN", "CBC"  No results found for: "MG" No results found for: "VD25OH"  No results found for: "PREALBUMIN"    Latest Ref Rng & Units 09/18/2022    1:22 AM 09/17/2022    3:39 AM 09/16/2022    4:49 AM  CBC EXTENDED  WBC 4.0 - 10.5 K/uL 10.9  9.7  8.3   RBC 3.87 - 5.11 MIL/uL 3.97  3.96  3.69   Hemoglobin 12.0 - 15.0 g/dL 30.8  65.7  84.6   HCT 36.0  - 46.0 % 35.5  35.1  33.3   Platelets 150 - 400 K/uL 245  232  203      There is no height or weight on file to calculate BMI.  Orders:  No orders of the defined types were placed in this encounter.  No orders of the defined types were placed in this encounter.    Procedures: No procedures performed  Clinical Data: No additional findings.  ROS:  All other systems negative, except as noted in the HPI. Review of Systems  Objective: Vital Signs: There were no vitals taken for this visit.  Specialty Comments:  No specialty comments available.  PMFS History: Patient Active Problem List   Diagnosis Date Noted   Trimalleolar fracture of ankle, closed, left, sequela 09/13/2022   Trimalleolar fracture of ankle, closed 09/13/2022   Major neurocognitive disorder, possible Alzheimer's disease 05/28/2019   Menopausal flushing    Menopausal syndrome    Vitamin D deficiency    Hyperlipidemia    Rheumatoid arthritis    History of substance abuse    S/P lumbar spinal fusion 08/15/2016   Past Medical History:  Diagnosis Date   History of substance abuse    Briefly spent time in substance abuse rehabilitation treatment as a teenager. Cocaine likely represented the primary drug of abuse.  Hyperlipidemia    "I used to take med. for cholesteol, but only for a short time"   Major neurocognitive disorder 05/28/2019   Menopausal syndrome    Rheumatoid arthritis    Lumbar - spondylisthesis   S/P lumbar spinal fusion 08/15/2016   Vitamin D deficiency     Family History  Problem Relation Age of Onset   Alzheimer's disease Paternal Grandmother     Past Surgical History:  Procedure Laterality Date   ABDOMINAL HYSTERECTOMY     ORIF ANKLE FRACTURE Left 09/14/2022   Procedure: OPEN REDUCTION INTERNAL FIXATION (ORIF) ANKLE FRACTURE;  Surgeon: Nadara Mustard, MD;  Location: [facility] OR;  Service: Orthopedics;  Laterality: Left;   TUBAL LIGATION     VAGINAL DELIVERY     x2   Social History    Occupational History   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[employer]  Tobacco Use   Smoking status: Former    Current packs/day: 0.00    Types: Cigarettes    Quit date: 08/10/1975    Years since quitting: 47.4   Smokeless tobacco: Never  Substance and Sexual Activity   Alcohol use: Yes    Comment: very rarely   Drug use: Not Currently    Types: Cocaine, Marijuana   Sexual activity: Not on file

## 2023-01-01 DIAGNOSIS — R269 Unspecified abnormalities of gait and mobility: Secondary | ICD-10-CM | POA: Diagnosis not present

## 2023-01-01 DIAGNOSIS — M25572 Pain in left ankle and joints of left foot: Secondary | ICD-10-CM | POA: Diagnosis not present

## 2023-01-07 DIAGNOSIS — L853 Xerosis cutis: Secondary | ICD-10-CM | POA: Diagnosis not present

## 2023-01-07 DIAGNOSIS — Z79899 Other long term (current) drug therapy: Secondary | ICD-10-CM | POA: Diagnosis not present

## 2023-01-07 DIAGNOSIS — M79671 Pain in right foot: Secondary | ICD-10-CM | POA: Diagnosis not present

## 2023-01-07 DIAGNOSIS — M79641 Pain in right hand: Secondary | ICD-10-CM | POA: Diagnosis not present

## 2023-01-07 DIAGNOSIS — M0609 Rheumatoid arthritis without rheumatoid factor, multiple sites: Secondary | ICD-10-CM | POA: Diagnosis not present

## 2023-01-07 DIAGNOSIS — M2550 Pain in unspecified joint: Secondary | ICD-10-CM | POA: Diagnosis not present

## 2023-01-07 DIAGNOSIS — M79642 Pain in left hand: Secondary | ICD-10-CM | POA: Diagnosis not present

## 2023-01-07 DIAGNOSIS — M79672 Pain in left foot: Secondary | ICD-10-CM | POA: Diagnosis not present

## 2023-01-08 DIAGNOSIS — M25572 Pain in left ankle and joints of left foot: Secondary | ICD-10-CM | POA: Diagnosis not present

## 2023-01-08 DIAGNOSIS — R269 Unspecified abnormalities of gait and mobility: Secondary | ICD-10-CM | POA: Diagnosis not present

## 2023-02-19 ENCOUNTER — Other Ambulatory Visit: Payer: Self-pay | Admitting: Rheumatology

## 2023-02-19 DIAGNOSIS — M79643 Pain in unspecified hand: Secondary | ICD-10-CM | POA: Diagnosis not present

## 2023-02-19 DIAGNOSIS — M7989 Other specified soft tissue disorders: Secondary | ICD-10-CM | POA: Diagnosis not present

## 2023-02-19 DIAGNOSIS — J984 Other disorders of lung: Secondary | ICD-10-CM

## 2023-02-19 DIAGNOSIS — M1990 Unspecified osteoarthritis, unspecified site: Secondary | ICD-10-CM | POA: Diagnosis not present

## 2023-02-19 DIAGNOSIS — M349 Systemic sclerosis, unspecified: Secondary | ICD-10-CM | POA: Diagnosis not present

## 2023-02-20 ENCOUNTER — Encounter: Payer: Self-pay | Admitting: Rheumatology

## 2023-02-21 ENCOUNTER — Encounter: Payer: Self-pay | Admitting: Rheumatology

## 2023-02-27 ENCOUNTER — Ambulatory Visit
Admission: RE | Admit: 2023-02-27 | Discharge: 2023-02-27 | Disposition: A | Discharge status: Home / Self Care | Payer: Medicare Other | Source: Ambulatory Visit | Attending: Rheumatology | Admitting: Rheumatology

## 2023-02-27 DIAGNOSIS — I700 Atherosclerosis of aorta: Secondary | ICD-10-CM | POA: Diagnosis not present

## 2023-02-27 DIAGNOSIS — J984 Other disorders of lung: Secondary | ICD-10-CM

## 2023-02-27 DIAGNOSIS — M349 Systemic sclerosis, unspecified: Secondary | ICD-10-CM | POA: Diagnosis not present

## 2023-02-27 DIAGNOSIS — I2510 Atherosclerotic heart disease of native coronary artery without angina pectoris: Secondary | ICD-10-CM | POA: Diagnosis not present

## 2023-04-09 DIAGNOSIS — M1990 Unspecified osteoarthritis, unspecified site: Secondary | ICD-10-CM | POA: Diagnosis not present

## 2023-04-09 DIAGNOSIS — M79643 Pain in unspecified hand: Secondary | ICD-10-CM | POA: Diagnosis not present

## 2023-04-09 DIAGNOSIS — M349 Systemic sclerosis, unspecified: Secondary | ICD-10-CM | POA: Diagnosis not present

## 2023-04-09 DIAGNOSIS — M7989 Other specified soft tissue disorders: Secondary | ICD-10-CM | POA: Diagnosis not present

## 2023-07-16 DIAGNOSIS — Z79899 Other long term (current) drug therapy: Secondary | ICD-10-CM | POA: Diagnosis not present

## 2023-07-16 DIAGNOSIS — M1990 Unspecified osteoarthritis, unspecified site: Secondary | ICD-10-CM | POA: Diagnosis not present

## 2023-07-16 DIAGNOSIS — M349 Systemic sclerosis, unspecified: Secondary | ICD-10-CM | POA: Diagnosis not present

## 2023-07-16 DIAGNOSIS — R768 Other specified abnormal immunological findings in serum: Secondary | ICD-10-CM | POA: Diagnosis not present

## 2023-07-16 DIAGNOSIS — M7989 Other specified soft tissue disorders: Secondary | ICD-10-CM | POA: Diagnosis not present

## 2023-08-13 ENCOUNTER — Other Ambulatory Visit: Payer: Medicare Other

## 2023-08-13 ENCOUNTER — Ambulatory Visit: Payer: Medicare Other

## 2023-08-21 DIAGNOSIS — R030 Elevated blood-pressure reading, without diagnosis of hypertension: Secondary | ICD-10-CM | POA: Diagnosis not present

## 2023-08-27 ENCOUNTER — Encounter (HOSPITAL_BASED_OUTPATIENT_CLINIC_OR_DEPARTMENT_OTHER): Payer: Self-pay

## 2023-08-27 ENCOUNTER — Other Ambulatory Visit (HOSPITAL_BASED_OUTPATIENT_CLINIC_OR_DEPARTMENT_OTHER)

## 2023-09-18 ENCOUNTER — Encounter: Payer: Self-pay | Admitting: Family Medicine

## 2023-09-19 MED ORDER — MEMANTINE HCL 10 MG PO TABS: 10.0000 mg | ORAL_TABLET | Freq: Two times a day (BID) | ORAL | 0 refills | Status: DC

## 2023-09-23 ENCOUNTER — Telehealth: Payer: Self-pay | Admitting: Family Medicine

## 2023-09-23 MED ORDER — DONEPEZIL HCL 10 MG PO TABS: ORAL_TABLET | ORAL | 0 refills | Status: DC

## 2023-09-23 NOTE — Telephone Encounter (Signed)
 Last seen 12/05/22 by Dr. Buck. Next f/u 12/16/23.   I sent in refill as requested.

## 2023-09-23 NOTE — Telephone Encounter (Signed)
 Pharamacy called to request medication refill for PT    donepezil  (ARICEPT ) 10 MG tablet   Pt medication is to be sent to   [id number] - [place], [place] - [address] (Ph: [phone])

## 2023-10-11 ENCOUNTER — Encounter (HOSPITAL_BASED_OUTPATIENT_CLINIC_OR_DEPARTMENT_OTHER): Payer: Self-pay | Admitting: Internal Medicine

## 2023-10-11 ENCOUNTER — Ambulatory Visit (HOSPITAL_BASED_OUTPATIENT_CLINIC_OR_DEPARTMENT_OTHER): Admitting: Internal Medicine

## 2023-10-11 ENCOUNTER — Institutional Professional Consult (permissible substitution) (HOSPITAL_BASED_OUTPATIENT_CLINIC_OR_DEPARTMENT_OTHER): Admitting: Internal Medicine

## 2023-10-11 VITALS — BP 146/96 | HR 104 | Ht <= 58 in | Wt 198.1 lb

## 2023-10-11 DIAGNOSIS — E785 Hyperlipidemia, unspecified: Secondary | ICD-10-CM | POA: Diagnosis not present

## 2023-10-11 DIAGNOSIS — F0390 Unspecified dementia without behavioral disturbance: Secondary | ICD-10-CM

## 2023-10-11 DIAGNOSIS — I2510 Atherosclerotic heart disease of native coronary artery without angina pectoris: Secondary | ICD-10-CM

## 2023-10-11 DIAGNOSIS — I700 Atherosclerosis of aorta: Secondary | ICD-10-CM

## 2023-10-11 NOTE — Patient Instructions (Signed)
 Medication Instructions:   Not needed *If you need a refill on your cardiac medications before your next appointment, please call your pharmacy*   Lab Work:fasting NMR  Lp(a) If you have labs (blood work) drawn today and your tests are completely normal, you will receive your results only by: MyChart Message (if you have MyChart) OR A paper copy in the mail If you have any lab test that is abnormal or we need to change your treatment, we will call you to review the results.   Testing/Procedures:   Not needed Follow-Up: At [facility], you and your health needs are our priority.  As part of our continuing mission to provide you with exceptional heart care, we have created designated Provider Care Teams.  These Care Teams include your primary Cardiologist (physician) and Advanced Practice Providers (APPs -  Physician Assistants and Nurse Practitioners) who all work together to provide you with the care you need, when you need it.     Your next appointment:   As needed    The format for your next appointment:   In Person  Provider:   K. Italy Hilty, MD

## 2023-10-11 NOTE — Progress Notes (Signed)
 LIPID CLINIC CONSULT NOTE  Chief Complaint:  Manage dyslipidemia  Primary Care Physician: Katina Pfeiffer, PA-C  Primary Cardiologist:  None  HPI:  Donna Jacobs is a 63 y.o. female who is being seen today for the evaluation of dyslipidemia at the request of Cleotilde Planas, MD. is a pleasant 63 year old female who is companied by her daughter today.  She has a history of unfortunate early onset dementia which has been quite severe.  Additionally she had a CT scan of the chest in January of this year which showed atherosclerotic calcification of the aorta and aortic valve with age advanced coronary artery disease of all 3 coronaries.  It is unclear whether she has been symptomatic or not.  She is not very active and cannot well communicate any symptoms because of her dementia.  She had previously been on atorvastatin but had side effects including leg cramps on that but was switched to rosuvastatin  and she seems to be tolerating that without leg cramps but she has not had repeat lipids.  Prior to that her total cholesterol was 208, triglycerides 88, HDL 53 and LDL 139.  Her target LDL should be less than 70.  PMHx:  Past Medical History:  Diagnosis Date   History of substance abuse    Briefly spent time in substance abuse rehabilitation treatment as a teenager. Cocaine likely represented the primary drug of abuse.    Hyperlipidemia    I used to take med. for cholesteol, but only for a short time   Major neurocognitive disorder 05/28/2019   Menopausal syndrome    Rheumatoid arthritis    Lumbar - spondylisthesis   S/P lumbar spinal fusion 08/15/2016   Vitamin D deficiency     Past Surgical History:  Procedure Laterality Date   ABDOMINAL HYSTERECTOMY     ORIF ANKLE FRACTURE Left 09/14/2022   Procedure: OPEN REDUCTION INTERNAL FIXATION (ORIF) ANKLE FRACTURE;  Surgeon: Harden Jerona GAILS, MD;  Location: [facility] OR;  Service: Orthopedics;  Laterality: Left;   TUBAL LIGATION     VAGINAL  DELIVERY     x2    FAMHx:  Family History  Problem Relation Age of Onset   Alzheimer's disease Paternal Grandmother     SOCHx:   reports that she quit smoking about 48 years ago. Her smoking use included cigarettes. She has never used smokeless tobacco. She reports current alcohol use. She reports that she does not currently use drugs after having used the following drugs: Cocaine and Marijuana.  ALLERGIES:  Allergies  Allergen Reactions   Atorvastatin Other (See Comments)    Other Reaction(s): myalgia    Lipitor [Atorvastatin Calcium ]    Codeine Nausea And Vomiting    ROS: Pertinent items noted in HPI and remainder of comprehensive ROS otherwise negative.  HOME MEDS: Current Outpatient Medications on File Prior to Visit  Medication Sig Dispense Refill   donepezil  (ARICEPT ) 10 MG tablet TAKE 1 TABLET BY MOUTH AT BEDTIME 90 tablet 0   memantine  (NAMENDA ) 10 MG tablet Take 1 tablet (10 mg total) by mouth 2 (two) times daily. 180 tablet 0   Multiple Vitamins-Minerals (MULTIVITAMIN WOMEN) TABS Take 1 tablet by mouth daily in the afternoon.     rosuvastatin  (CRESTOR ) 10 MG tablet Take 10 mg by mouth daily.     amLODipine (NORVASC) 5 MG tablet Take 5 mg by mouth daily. (Patient not taking: Reported on 10/11/2023)     No current facility-administered medications on file prior to visit.  LABS/IMAGING: No results found for this or any previous visit (from the past 48 hours). No results found.  LIPID PANEL: No results found for: CHOL, TRIG, HDL, CHOLHDL, VLDL, LDLCALC, LDLDIRECT  No results found for: LIPOA   WEIGHTS: Wt Readings from Last 3 Encounters:  10/11/23 198 lb 1.6 oz (89.9 kg)  12/05/22 189 lb 12.8 oz (86.1 kg)  09/14/22 179 lb 7.3 oz (81.4 kg)    VITALS: BP (!) 146/96   Pulse (!) 104   Ht 4' 10 (1.473 m)   Wt 198 lb 1.6 oz (89.9 kg)   SpO2 100%   BMI 41.40 kg/m   EXAM: Deferred  EKG: Deferred  ASSESSMENT: Dyslipidemia, goal LDL  less than 70 CT evidence of atherosclerotic calcification of the aorta, aortic valve and three-vessel coronary artery disease Atorvastatin intolerant Early onset dementia  PLAN: 1.   Donna Jacobs has a dyslipidemia with a target LDL less than 70.  She also was found to have multivessel coronary artery disease and aortic valve calcification as well as atherosclerosis of the aorta.  She needs more aggressive lipid-lowering and recently has been started on rosuvastatin .  She seems to tolerate this but has not had repeat lipids.  I would like to get an NMR and LP(a) to see her current lipid values.  We may be able to make some adjustments in her statin.  If her LP(a) is elevated she might be a candidate for PCSK9 inhibitor.  Some small studies showed possible benefit with cholesterol lowering regarding Alzheimer's type dementia however there is no convincing randomized trial data.  Her daughter expressed a desire to avoid any invasive or anesthetic related procedures which I think is reasonable.  Thanks again for the kind referral.  Donna KYM Maxcy, MD, [facility], FNLA, FACP  [place]  [facility]  Medical Director of the [facility]  [facility]ovascular [facility] Reduction Clinic Diplomate of the American Board of Clinical Lipidology Attending Cardiologist  Direct Dial: [phone]  Fax: [phone]  Website:  [email or web address] 10/11/2023, 4:37 PM

## 2023-12-02 ENCOUNTER — Encounter: Payer: Self-pay | Admitting: Radiology

## 2023-12-11 NOTE — Progress Notes (Signed)
 Chief Complaint  Patient presents with   Follow-up    Pt in room 1. Husband and daughter in law in room. Here for Alzheimer follow up.    HISTORY OF PRESENT ILLNESS:  12/16/23 ALL: Donna Jacobs returns for follow up for dementia. She was last seen by Dr Buck 11/2022 and family reported she was having more difficulty with insomnia. Aricept  and Namenda  continued and low dose melatonin advised. Since, she remains stable. Her husband and daughter in law aid in history. No significant changes. She continues to require full assistance with ADLs. She has a good appetite. Sleeping well. She has had a few falls but no injuries. She is not very active. She does go to church and to her son's house on Sundays.   12/05/2022 SA:  She reports feeling okay, provides limited information, most of her history is provided by her husband and her daughter-in-law.  She had a fall in August and shattered her left ankle.  She required surgery, her surgeon was Dr. Harden.  She had in-home therapy with OT and PT and home health.  She is now doing outpatient physical therapy once a week, literally started this week.  She has difficulty sleeping at night.  They have not tried melatonin yet.  She does not use a walker or cane.  She has walking aids available at home but does not always know how to maneuver them.  The walker seems to be in the way according to her daughter-in-law.  She has a reasonably good appetite.  They encouraged her to drink water.  She has a pending appointment for her physical with blood work also.  She had a remote history of back surgery and has scoliosis.  She has been walking with a limp but it has become worse since her fall and surgery.   10/25/2021 ALL:  Donna Jacobs returns for follow up for early onset dementia. She continues donepezil  10mg  daily and memantine  10mg  BID. She has tolerated increased dose of memantine . Memory seems stable. No significant changes. She continues to be fairly independent with  ADLs. Appetite is good. Family has been trying to reduce carbs and fatty foods due to elevated cholesterol levels. She has lost about 20lbs. She is sleeping well. Mood is good. She does get very anxious with doctor visits. BP is usually high when she comes to see us  but seems to come down. Family has not checked at home. She lives with Jerel, husband. There is some concern of caregiver burnout. No safety concerns. Her daughter reports that Jerel is with her the majority of the time and doesn't get much free time. He has not reached out for help. Her daughter brings her to her home every Sunday for a few hours. She seems to really enjoy this. She goes to church with her best friend every Sunday.   12/26/2020 ALL: Donna Jacobs returns for follow up for early onset AD. She continues donepezil  10mg  daily and memantine  5mg  BID. She is doing fairly well. No significant changes. She does have more difficulty getting into her son's truck. She doesn't remember what foot to put on the step first. She continues to be mostly independent with ADLs. She does not drink much water, loves Dr Nunzio. Appetite is good. She is less active. Mood is good. She lives with her husband. She does not drive. BP is normally good. They have not checked at home but reports normal readings at PCP visits. She is asymptomatic today.   06/14/2020 ALL: Donna Jacobs  returns for follow up for early onset AD. She continues Aricept  10mg  daily. Namenda  5mg  BID was added at last follow up in 12/2019. Mood has been much better. She is less irritable. She continues to live with her husband, Jerel. She requires some assistance with ADL's. Medications are given by her family. She is eating and sleeping well. No changes in gait. No falls. She is not as active as she used to be. She loves to sit and watch TV. She likes to drink Dr Nunzio.   01/26/2020 ALL:  Donna Jacobs is a 63 y.o. female here today for follow up for early onset AD. Overall, she seems to be about  the same since last being seen. Her daughter in law presents with her today and aids in history. She reports that around September, she noticed that she seemed more irritable and depressed. No previous history of depression. No concerns of SI/HI. She was a very active person when healthy. Now she is not as active and gets very frustrated when she can't go out, shop or visit with friends. She does not seem to understand why she is not able to go out.  She lives with her husband. She does not drive. Mr Wee gives medications and prepares all foods. Mrs Poland is able to perform most other ADL's independently.   HISTORY (copied from Dr Obie previous note)  Donna Jacobs is a 63 year old right-handed woman with an underlying medical history of vitamin D deficiency, hyperlipidemia, arthritis, former smoking and obesity, who presents for follow-up consultation of her memory loss of 3+ years duration.  The patient is accompanied by her daughter-in-law, Donna Jacobs, today.  I last saw her on 06/18/2019, at which time we talked about her recent test results including her MRI and neuropsychological evaluation.  She was agreeable to pursuing a PET scan.  She was advised to start Aricept  10 mg strength half a pill with increase to 1 pill after about a month.  She had an interim brain metabolic PET scan on 07/14/2019 and I reviewed the results: IMPRESSION: 1. Symmetric decreased radiotracer activity within bilateral parietal lobes compatible with a pattern seen with Alzheimer's dementia.   Her mother was contacted with her test results.   Today, 09/21/2019: She reports doing fairly well.  Sometimes she feels her donepezil  was making her tired and her eyelids become heavy.  She has a routine eye examination pending for later this year in November.  She has not fallen asleep inadvertently during the day.  In the first 2 weeks after starting the full dose of donepezil  she was notably irritable per daughter-in-law.  She had some  anger type reactions that were not like her.  It did help to take a break and go to the beach with the family.  She did well with that.  She has had some intermittent diarrhea but has a history of having diarrhea in the past, she saw GI in the past, has not seen a GI specialist as of late.  All in all, she is able to tolerate the donepezil  and she has not had any mood irritability lately.  She is interested in watching TV.  She does not do a whole lot in terms of other activities.  She has not been able to exercise on a regular basis, walking outside has been possible intermittently weather permitting.  She has increased her water intake and decreased her soda intake.  She has not fallen recently.  She has seen rheumatology and had  significant amount of blood work per daughter-in-law and was told to start vitamin D.  She has been using a gummy type multivitamin, no separate vitamin D.  They are not sure how much vitamin D is in it.     The patient's allergies, current medications, family history, past medical history, past social history, past surgical history and problem list were reviewed and updated as appropriate.    Previously:    I first met her on 12/16/2018 at the request of her primary care, at which time the patient reported a history of memory loss, she had been accompanied by her friend at the time.  She had recently had blood work through her primary care and I suggested further work-up in the form of brain MRI and also seeking consultation with neuropsychology.   She had a brain MRI without contrast on 12/30/2018 and I reviewed the results: :    MRI brain (without) demonstrating: - Mild periventricular and subcortical foci of T2 hyperintensities. These findings are non-specific and considerations include autoimmune, inflammatory, post-infectious, microvascular ischemic or migraine associated etiologies.  - No acute findings.   She had consultation, evaluation and feedback appointment with  Dr. Richie, neuropsychologist at [facility] neurology and I reviewed his impression and recommendations.    His evaluation was supportive of early onset Alzheimer's disease.  Further evaluation was recommended in the form of PET scan or CSF evaluation.  Medication recommendations included anticholinergic dementia medication or Namenda .   12/16/18: (She) reports memory loss for the past 3+ years.  The patient has had forgetfulness and often misplaces things.  She has been driving.  Her friend has been able to observe her driving and feels that she has done well with local and familiar routes.  However, patient does report that she has gotten lost driving at times.  She continues to work.  She works for [employer], [employer], and has worked for the same company for over 30 years. She has had some mood issues including frustration, her husband has also been frustrated with her as I understand.  She lives with her husband, she has 2 grown sons.  She has 3 grandsons.  She quit smoking over 20 years ago.  She has a remote history of drug abuse and was in rehab when she was a teenager or young adults.  She admits to trying cocaine but denies any IV drug use, she denies any recent drug use or relapse, she has smoked marijuana as well.  She does not hydrate very well with water she admits.  She likes to drink Diet Coke.  She is not sure how many cans she drinks per day.  She does not currently drink any alcohol.  She denies any heavy alcohol use or alcohol abuse in the past.  She does not snore.  Bari has not noticed any gasping sounds or snoring sounds when they have shared a room before on a trip. She reports that her paternal grandmother had Alzheimer's disease.  Paternal aunt had no dementia.  The patient is the oldest of 3, she has a brother and a sister, neither 1 have memory loss.  I reviewed your office note from 10/27/2018.  She was referred to rheumatology at the time for a presumed diagnosis of rheumatoid  arthritis.  She was also referred to orthopedics for leg length discrepancy.  She had blood work at the time including CBC with differential, B12, TSH, vitamin D.  Her B12 level was 257, vitamin D low at 21.9,  TSH was normal, CBC with differential was unremarkable.  She is followed by rheumatology for her arthritis and prior diagnosis of rheumatoid arthritis.   REVIEW OF SYSTEMS: Out of a complete 14 system review of symptoms, the patient complains only of the following symptoms, memory loss, and all other reviewed systems are negative.   ALLERGIES: Allergies  Allergen Reactions   Atorvastatin Other (See Comments)    Other Reaction(s): myalgia    Lipitor [Atorvastatin Calcium ]    Codeine Nausea And Vomiting     HOME MEDICATIONS: Outpatient Medications Prior to Visit  Medication Sig Dispense Refill   amLODipine (NORVASC) 5 MG tablet Take 5 mg by mouth daily.     donepezil  (ARICEPT ) 10 MG tablet TAKE 1 TABLET BY MOUTH AT BEDTIME 90 tablet 0   memantine  (NAMENDA ) 10 MG tablet Take 1 tablet (10 mg total) by mouth 2 (two) times daily. 180 tablet 0   Multiple Vitamins-Minerals (MULTIVITAMIN WOMEN) TABS Take 1 tablet by mouth daily in the afternoon.     rosuvastatin  (CRESTOR ) 10 MG tablet Take 10 mg by mouth daily.     No facility-administered medications prior to visit.     PAST MEDICAL HISTORY: Past Medical History:  Diagnosis Date   History of substance abuse    Briefly spent time in substance abuse rehabilitation treatment as a teenager. Cocaine likely represented the primary drug of abuse.    Hyperlipidemia    I used to take med. for cholesteol, but only for a short time   Major neurocognitive disorder 05/28/2019   Menopausal syndrome    Rheumatoid arthritis    Lumbar - spondylisthesis   S/P lumbar spinal fusion 08/15/2016   Vitamin D deficiency      PAST SURGICAL HISTORY: Past Surgical History:  Procedure Laterality Date   ABDOMINAL HYSTERECTOMY     ORIF ANKLE  FRACTURE Left 09/14/2022   Procedure: OPEN REDUCTION INTERNAL FIXATION (ORIF) ANKLE FRACTURE;  Surgeon: Harden Jerona GAILS, MD;  Location: [facility] OR;  Service: Orthopedics;  Laterality: Left;   TUBAL LIGATION     VAGINAL DELIVERY     x2     FAMILY HISTORY: Family History  Problem Relation Age of Onset   Alzheimer's disease Paternal Grandmother      SOCIAL HISTORY: Social History   Socioeconomic History   Marital status: Married    Spouse name: Not on file   Number of children: Not on file   Years of education: 12   Highest education level: High school graduate  Occupational History   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[employer]  Tobacco Use   Smoking status: Former    Current packs/day: 0.00    Types: Cigarettes    Quit date: 08/10/1975    Years since quitting: 48.3   Smokeless tobacco: Never  Substance and Sexual Activity   Alcohol use: Not Currently    Comment: very rarely   Drug use: Not Currently    Types: Cocaine, Marijuana   Sexual activity: Not on file  Other Topics Concern   Not on file  Social History Narrative   Not on file   Social Drivers of Health   Financial Resource Strain: Not on file  Food Insecurity: Not on file  Transportation Needs: Not on file  Physical Activity: Not on file  Stress: Not on file  Social Connections: Not on file  Intimate Partner Violence: Not on file      PHYSICAL EXAM  Vitals:   12/16/23 1034  BP:  125/83  Pulse: (!) 55  SpO2: 100%  Weight: 197 lb 8 oz (89.6 kg)  Height: 4' 10 (1.473 m)     Body mass index is 41.28 kg/m.   Generalized: Well developed, in no acute distress  Cardiology: normal rate and rhythm, no murmur auscultated  Respiratory: clear to auscultation bilaterally    Neurological examination  Mentation: Alert, she is not oriented to time, but is oriented to place and some history taking. Follows most commands with repeated instruction, speech and language fluent Cranial nerve II-XII:  Pupils were equal round reactive to light. Extraocular movements were full, visual field were full on confrontational test. Facial sensation and strength were normal. Uvula tongue midline. Head turning and shoulder shrug  were normal and symmetric. Motor: The motor testing reveals 5 over 5 strength of all 4 extremities. Good symmetric motor tone is noted throughout.  Sensory: Sensory testing is intact to soft touch on all 4 extremities. No evidence of extinction is noted.  Coordination: unable to test  Gait and station: Gait is arthritic, limp noted     DIAGNOSTIC DATA (LABS, IMAGING, TESTING) - I reviewed patient records, labs, notes, testing and imaging myself where available.  Lab Results  Component Value Date   WBC 10.9 (H) 09/18/2022   HGB 11.9 (L) 09/18/2022   HCT 35.5 (L) 09/18/2022   MCV 89.4 09/18/2022   PLT 245 09/18/2022      Component Value Date/Time   NA 137 09/20/2022 1047   K 4.0 09/20/2022 1047   CL 103 09/20/2022 1047   CO2 26 09/20/2022 1047   GLUCOSE 98 09/20/2022 1047   BUN 13 09/20/2022 1047   CREATININE 0.75 09/20/2022 1047   CALCIUM  8.6 (L) 09/20/2022 1047   GFRNONAA >60 09/20/2022 1047   GFRAA >60 08/09/2016 1526   No results found for: CHOL, HDL, LDLCALC, LDLDIRECT, TRIG, CHOLHDL No results found for: YHAJ8R No results found for: VITAMINB12 No results found for: TSH     10/25/2021    1:53 PM 12/26/2020    9:00 AM 06/14/2020    9:53 AM  MMSE - Mini Mental State Exam  Not completed:  Unable to complete   Orientation to time 0  1  Orientation to Place 0  3  Registration 3  1  Attention/ Calculation 0  0  Recall 0  0  Language- name 2 objects 1  1  Language- repeat 0  1  Language- follow 3 step command 2  1  Language- read & follow direction 1  0  Write a sentence 0  0  Copy design 0  0  Total score 7  8     ASSESSMENT AND PLAN  63 y.o. year old female  has a past medical history of History of substance abuse,  Hyperlipidemia, Major neurocognitive disorder (05/28/2019), Menopausal syndrome, Rheumatoid arthritis, S/P lumbar spinal fusion (08/15/2016), and Vitamin D deficiency. here with   Early onset Alzheimer's dementia without behavioral disturbance (HCC)  Emie is doing well, today. Memory is fairly stable. She has tolerated donepezil  10mg  daily and memantine  10mg  twice daily. We will continue current treatment plan. I have educated the family on the progressive nature of Alzheimer's the disease. I have encouraged that they reach out to her payer to inquire about potential long term memory loss benefits. Consider palliative care consult. I have encouraged her to stay as active as possible.  Healthy lifestyle habits reviewed.  Memory compensation strategies encouraged. She will follow-up in 1 year, sooner if  needed.  She and her daughter both verbalized understanding and agreement with this plan.  No orders of the defined types were placed in this encounter.   I spent 30 minutes of face-to-face and non-face-to-face time with patient.  This included previsit chart review, lab review, study review, order entry, electronic health record documentation, patient education.   Greig Forbes, MSN, FNP-C 12/16/2023, 12:31 PM  [facility] [address], [address] [place], [place] [address] [phone]

## 2023-12-11 NOTE — Patient Instructions (Signed)
 Below is our plan:  We will continue donepezil  10mg  daily and memantine  10mg  twice daily.   Please make sure you are staying well hydrated. I recommend 50-60 ounces daily. Well balanced diet and regular exercise encouraged. Consistent sleep schedule with 6-8 hours recommended.   Please continue follow up with care team as directed.   Follow up with me or Dr Buck in 1 year   You may receive a survey regarding today's visit. I encourage you to leave honest feed back as I do use this information to improve patient care. Thank you for seeing me today!   Management of Memory Problems   There are some general things you can do to help manage your memory problems.  Your memory may not in fact recover, but by using techniques and strategies you will be able to manage your memory difficulties better.   1)  Establish a routine. Try to establish and then stick to a regular routine.  By doing this, you will get used to what to expect and you will reduce the need to rely on your memory.  Also, try to do things at the same time of day, such as taking your medication or checking your calendar first thing in the morning. Think about think that you can do as a part of a regular routine and make a list.  Then enter them into a daily planner to remind you.  This will help you establish a routine.   2)  Organize your environment. Organize your environment so that it is uncluttered.  Decrease visual stimulation.  Place everyday items such as keys or cell phone in the same place every day (ie.  Basket next to front door) Use post it notes with a brief message to yourself (ie. Turn off light, lock the door) Use labels to indicate where things go (ie. Which cupboards are for food, dishes, etc.) Keep a notepad and pen by the telephone to take messages   3)  Memory Aids A diary or journal/notebook/daily planner Making a list (shopping list, chore list, to do list that needs to be done) Using an alarm as a  reminder (kitchen timer or cell phone alarm) Using cell phone to store information (Notes, Calendar, Reminders) Calendar/White board placed in a prominent position Post-it notes   In order for memory aids to be useful, you need to have good habits.  It's no good remembering to make a note in your journal if you don't remember to look in it.  Try setting aside a certain time of day to look in journal.   4)  Improving mood and managing fatigue. There may be other factors that contribute to memory difficulties.  Factors, such as anxiety, depression and tiredness can affect memory. Regular gentle exercise can help improve your mood and give you more energy. Exercise: there are short videos created by the [employer] on Health specially for older adults: https://bit.ly/2I30q97.  Mediterranean diet: which emphasizes fruits, vegetables, whole grains, legumes, fish, and other seafood; unsaturated fats such as olive oils; and low amounts of red meat, eggs, and sweets. A variation of this, called MIND (Mediterranean-DASH Intervention for Neurodegenerative Delay) incorporates the DASH (Dietary Approaches to Stop Hypertension) diet, which has been shown to lower high blood pressure, a risk factor for Alzheimer's disease. More information at: exitmarketing.de.  Aerobic exercise that improve heart health is also good for the mind.  [employer] on Aging have short videos for exercises that you can do at home: Blindworkshop.com.pt  Simple relaxation techniques may help relieve symptoms of anxiety Try to get back to completing activities or hobbies you enjoyed doing in the past. Learn to pace yourself through activities to decrease fatigue. Find out about some local support groups where you can share experiences with others. Try and achieve 7-8 hours of sleep at night.   Resources for Family/Caregiver  Online caregiver  support groups can be found at westerntunes.it or call Alzheimer's Association's 24/7 hotline: 615-505-6757. [facility] Memory Counseling Program offers in-person, virtual support groups and individual counseling for both care partners and persons with memory loss. Call for more information at (312)665-3731.   Advanced care plan: there are two types of Power of Attorney: healthcare and durable. Healthcare POA is a designated person to make healthcare decisions on your behalf if you were too sick to make them yourself. This person can be selected and documented by your physician. Durable POA has to be set up with a lawyer who takes charge of your finances and estate if you were too sick or cognitively impaired to manage your finances accurately. You can find a local Elder Therapist, art here: newportranch.at.  Check out www.planyourlifespan.org, which will help you plan before a crisis and decide who will take care of life considerations in a circumstance where you may not be able to speak for yourself.   Helpful books (available on [employer] or your local bookstore):  By Dr. Katherene Gentry: Keeping Love Alive as Memories Fade: The 5 Love Languages and the Alzheimer's Journey Oct 30, 2014 The Dementia Care Partner's Workbook: A Guide for Understanding, Education, and [employer] - June 29, 2017.  Both available for less than $15.   Coping with behavior change in dementia: a family caregiver's guide by Landry Mirza & Mitzie Pizza A Caregiver's Guide to Dementia: Using Activities and Other Strategies to Prevent, Reduce and Manage Behavioral Symptoms by Leita SAILOR. [place] and Catherine Piersol.  Creating Moments of Joy for the Person with Alzheimer's or Dementia 4th edition by Melanie Mings  Caregiver videos on common behaviors related to dementia: populationgame.pl  [place] Caregiver Portal: free to sign up, links to local resources: [email or web address]

## 2023-12-16 ENCOUNTER — Encounter: Payer: Self-pay | Admitting: Family Medicine

## 2023-12-16 ENCOUNTER — Ambulatory Visit: Payer: Medicare Other | Admitting: Family Medicine

## 2023-12-16 VITALS — BP 125/83 | HR 55 | Ht <= 58 in | Wt 197.5 lb

## 2023-12-16 DIAGNOSIS — G300 Alzheimer's disease with early onset: Secondary | ICD-10-CM

## 2023-12-16 DIAGNOSIS — F0280 Dementia in other diseases classified elsewhere without behavioral disturbance: Secondary | ICD-10-CM | POA: Diagnosis not present

## 2023-12-20 ENCOUNTER — Other Ambulatory Visit: Payer: Self-pay | Admitting: Neurology

## 2023-12-20 NOTE — Telephone Encounter (Signed)
 Last seen on 12/16/23 Follow up scheduled 01/06/24

## 2023-12-25 DIAGNOSIS — E785 Hyperlipidemia, unspecified: Secondary | ICD-10-CM | POA: Diagnosis not present

## 2023-12-26 LAB — NMR, LIPOPROFILE
Cholesterol, Total: 195 mg/dL (ref 100–199)
HDL Particle Number: 32.2 umol/L (ref >=30.5)
HDL-C: 64 mg/dL (ref >39)
LDL Particle Number: 1238 nmol/L — ABNORMAL HIGH (ref <1000)
LDL Size: 21.4 nm (ref >20.5)
LDL-C (NIH Calc): 120 mg/dL — ABNORMAL HIGH (ref 0–99)
LP-IR Score: <25 (ref <=45)
Small LDL Particle Number: 303 nmol/L (ref <=527)
Triglycerides: 60 mg/dL (ref 0–149)

## 2023-12-26 LAB — LIPOPROTEIN A (LPA): Lipoprotein (a): <8.4 nmol/L (ref <75.0)

## 2023-12-30 ENCOUNTER — Ambulatory Visit: Payer: Self-pay | Admitting: Internal Medicine

## 2023-12-30 DIAGNOSIS — E785 Hyperlipidemia, unspecified: Secondary | ICD-10-CM

## 2024-01-01 DIAGNOSIS — M1990 Unspecified osteoarthritis, unspecified site: Secondary | ICD-10-CM | POA: Diagnosis not present

## 2024-01-01 DIAGNOSIS — M349 Systemic sclerosis, unspecified: Secondary | ICD-10-CM | POA: Diagnosis not present

## 2024-01-01 DIAGNOSIS — I7300 Raynaud's syndrome without gangrene: Secondary | ICD-10-CM | POA: Diagnosis not present

## 2024-01-01 DIAGNOSIS — Z79899 Other long term (current) drug therapy: Secondary | ICD-10-CM | POA: Diagnosis not present

## 2024-01-20 MED ORDER — ROSUVASTATIN CALCIUM 20 MG PO TABS: 20.0000 mg | ORAL_TABLET | Freq: Every day | ORAL | 3 refills | Status: AC

## 2025-01-05 ENCOUNTER — Ambulatory Visit: Admitting: Family Medicine
